# Patient Record
Sex: Male | Born: 1945 | Race: White | Hispanic: No | Smoking: Never smoker
Health system: Southern US, Community
[De-identification: ages and names within clinical notes are randomized; demographics above are authoritative.]

## PROBLEM LIST (undated history)

## (undated) DIAGNOSIS — F419 Anxiety disorder, unspecified: Secondary | ICD-10-CM

## (undated) DIAGNOSIS — E785 Hyperlipidemia, unspecified: Secondary | ICD-10-CM

## (undated) DIAGNOSIS — J189 Pneumonia, unspecified organism: Secondary | ICD-10-CM

## (undated) DIAGNOSIS — I499 Cardiac arrhythmia, unspecified: Secondary | ICD-10-CM

## (undated) DIAGNOSIS — I10 Essential (primary) hypertension: Secondary | ICD-10-CM

## (undated) DIAGNOSIS — C801 Malignant (primary) neoplasm, unspecified: Secondary | ICD-10-CM

## (undated) HISTORY — PX: CARDIOVERSION: SHX1299

## (undated) HISTORY — DX: Essential (primary) hypertension: I10

## (undated) HISTORY — PX: EYE SURGERY: SHX253

## (undated) HISTORY — DX: Hyperlipidemia, unspecified: E78.5

## (undated) SURGERY — Surgical Case
Anesthesia: *Unknown

---
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
## 2002-10-02 ENCOUNTER — Encounter: Payer: Self-pay | Admitting: Emergency Medicine

## 2002-10-03 ENCOUNTER — Inpatient Hospital Stay (HOSPITAL_COMMUNITY): Admission: EM | Admit: 2002-10-03 | Discharge: 2002-10-05 | Payer: Self-pay | Admitting: Emergency Medicine

## 2002-10-05 ENCOUNTER — Encounter: Payer: Self-pay | Admitting: Cardiology

## 2002-10-06 ENCOUNTER — Emergency Department (HOSPITAL_COMMUNITY): Admission: EM | Admit: 2002-10-06 | Discharge: 2002-10-06 | Payer: Self-pay | Admitting: Emergency Medicine

## 2002-10-06 ENCOUNTER — Encounter: Payer: Self-pay | Admitting: Emergency Medicine

## 2002-12-16 ENCOUNTER — Ambulatory Visit (HOSPITAL_COMMUNITY): Admission: RE | Admit: 2002-12-16 | Discharge: 2002-12-16 | Payer: Self-pay | Admitting: Cardiology

## 2004-09-15 ENCOUNTER — Ambulatory Visit: Payer: Self-pay | Admitting: Cardiology

## 2004-10-13 ENCOUNTER — Ambulatory Visit: Payer: Self-pay | Admitting: Cardiology

## 2004-12-18 ENCOUNTER — Ambulatory Visit: Payer: Self-pay | Admitting: Cardiology

## 2005-01-08 ENCOUNTER — Ambulatory Visit: Payer: Self-pay | Admitting: Cardiovascular Disease

## 2005-02-12 ENCOUNTER — Ambulatory Visit: Payer: Self-pay | Admitting: Cardiology

## 2005-05-03 ENCOUNTER — Ambulatory Visit: Payer: Self-pay | Admitting: Cardiology

## 2005-05-31 ENCOUNTER — Ambulatory Visit: Payer: Self-pay | Admitting: Internal Medicine

## 2005-07-12 ENCOUNTER — Ambulatory Visit: Payer: Self-pay | Admitting: Internal Medicine

## 2005-08-09 ENCOUNTER — Ambulatory Visit: Payer: Self-pay | Admitting: Cardiology

## 2005-09-06 ENCOUNTER — Ambulatory Visit: Payer: Self-pay | Admitting: Cardiology

## 2005-10-10 ENCOUNTER — Ambulatory Visit: Payer: Self-pay | Admitting: Cardiology

## 2005-11-07 ENCOUNTER — Ambulatory Visit: Payer: Self-pay | Admitting: Cardiovascular Disease

## 2005-11-28 ENCOUNTER — Ambulatory Visit: Payer: Self-pay | Admitting: Cardiology

## 2005-12-24 ENCOUNTER — Ambulatory Visit: Payer: Self-pay | Admitting: Cardiology

## 2006-01-29 ENCOUNTER — Ambulatory Visit: Payer: Self-pay | Admitting: Cardiology

## 2006-03-04 ENCOUNTER — Ambulatory Visit: Payer: Self-pay | Admitting: Cardiology

## 2006-04-02 ENCOUNTER — Ambulatory Visit: Payer: Self-pay | Admitting: Cardiology

## 2006-06-10 ENCOUNTER — Ambulatory Visit: Payer: Self-pay | Admitting: Internal Medicine

## 2006-07-09 ENCOUNTER — Ambulatory Visit: Payer: Self-pay | Admitting: Cardiovascular Disease

## 2006-07-23 ENCOUNTER — Ambulatory Visit: Payer: Self-pay | Admitting: Cardiology

## 2006-08-09 ENCOUNTER — Ambulatory Visit: Payer: Self-pay | Admitting: Cardiology

## 2006-09-11 ENCOUNTER — Ambulatory Visit: Payer: Self-pay | Admitting: Cardiology

## 2006-11-29 ENCOUNTER — Ambulatory Visit: Payer: Self-pay | Admitting: Internal Medicine

## 2006-12-20 ENCOUNTER — Ambulatory Visit: Payer: Self-pay | Admitting: Cardiology

## 2007-07-24 ENCOUNTER — Ambulatory Visit: Payer: Self-pay | Admitting: Cardiology

## 2010-06-12 ENCOUNTER — Ambulatory Visit: Payer: Self-pay | Admitting: Cardiovascular Disease

## 2010-07-21 ENCOUNTER — Ambulatory Visit: Payer: Self-pay | Admitting: Cardiology

## 2010-08-29 ENCOUNTER — Ambulatory Visit: Payer: Self-pay | Admitting: Cardiology

## 2010-10-02 ENCOUNTER — Ambulatory Visit: Payer: Self-pay | Admitting: Cardiology

## 2010-11-26 ENCOUNTER — Encounter: Payer: Self-pay | Admitting: Family Medicine

## 2011-02-19 ENCOUNTER — Other Ambulatory Visit: Payer: Self-pay | Admitting: *Deleted

## 2011-02-19 DIAGNOSIS — I4891 Unspecified atrial fibrillation: Secondary | ICD-10-CM

## 2011-02-19 MED ORDER — METOPROLOL TARTRATE 50 MG PO TABS: 50.0000 mg | ORAL_TABLET | Freq: Every day | ORAL | Status: DC

## 2011-02-19 MED ORDER — DILTIAZEM HCL ER COATED BEADS 300 MG PO CP24: 300.0000 mg | ORAL_CAPSULE | Freq: Every day | ORAL | Status: DC

## 2011-02-19 NOTE — Telephone Encounter (Signed)
escribe medication per fax request  

## 2011-03-23 NOTE — Consult Note (Signed)
NAMENICOLO, TOMKO NO.:  [id number]   MEDICAL RECORD NO.:  [id number]                   PATIENT TYPE:  INP   LOCATION:  0102                                 FACILITY:  [facility]   PHYSICIAN:  Rollene Rotunda, M.D. [facility]            DATE OF BIRTH:  11-Oct-1946   DATE OF CONSULTATION:  DATE OF DISCHARGE:                                   CONSULTATION   REASON FOR PRESENTATION:  The patient with atrial fibrillation.   HISTORY OF PRESENT ILLNESS:  The patient is a very pleasant 65 year old  gentleman with no prior cardiac history. He says that over the past four  weeks, he has had sporadic left lower quadrant and suprapubic pain. This has  been fleeting. He describes it as a dull heaviness. He has a feeling like he  needs to have a bowel movement. This afternoon, he had a particularly severe  episode of this. It was unrelenting. He tried to drink Mag Citrate. He  vomited with this. He denies constipation though he has not been quite as  regular. He has had no diarrhea or bleeding in his bowel movements. He  denies any change in his dietary habits, weight, abdominal distention. He  has been trying to lose weight intentionally. Of note, when the patient came  to the emergency room he was noted to be in atrial fibrillation with rapid  ventricular response. He was given IV Cardizem and started on by mouth. He  was started on IV Heparin. The patient does not notice palpitations. He has  had no presyncope or syncope. He denies any other cardiac symptoms. He says  that he is active at work. He denies any chest discomfort,  neck discomfort,  arm discomfort, activity induced nausea and vomiting, excessive diaphoresis.  He has had no PND or orthopnea.   PAST MEDICAL HISTORY:  Hypertension times there years, panic attacks. He has  no history of diabetes mellitus or hyperlipidemia as far as he knows. 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   PAST SURGICAL HISTORY:  Appendectomy.   ALLERGIES:  No known drug allergies.   MEDICATIONS:  1. Norvasc 10 mg each day.  2. Xanax 0.5 mg each day.   SOCIAL HISTORY:  The patient has never smoked cigarettes. He does drink  alcohol socially. He has not had any recent changes in alcohol use although  he  has had a little more than usual 3-4 days ago. He has been married for 40  years. He has two children, two natural and one adopted. He has there  grandchildren. 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   FAMILY HISTORY:  Contributory for early coronary artery disease with his  father having myocardial infarction in his 56's and dying in his 50's of  myocardial infarction. Has a brother having myocardial infarction  in his  66's and dying at age 44 years.   REVIEW OF SYSTEMS:  As stated in the HPI. Positive for headache, chronic  left shoulder pain. Otherwise as stated in the HPI. In particular, negative  for hair or skin changes, voice changes, heat or cold intolerance.   PHYSICAL EXAMINATION:  GENERAL: In no distress.  VITAL SIGNS: Initial blood pressure 174/99. Heart rate 120 and irregularly  irregular.  HEENT: Unremarkable. Pupils are equal, round, and reactive to light and  accommodation. Fundi not visualized. Oral mucosa unremarkable.  NECK: No jugular venous distention. Carotid upstroke brisk and symmetric. No  bruits or thyromegaly.  LYMPH: No cervical, axillary, or inguinal adenopathy.  LUNGS: Clear to auscultation and percussion.  BACK: No costovertebral angle tenderness.  CHEST: Unremarkable.  HEART: PMI not displaced or sustained. S1 and S2 within normal limits. No  S3, S4. No murmurs.  ABDOMEN: Obese. Positive bowel sounds. Normal in frequency and pitch. No  bruits, rebound, guarding, midline pulsatile mass, hepatosplenomegaly.  SKIN: Multiple excoriated lesions, particularly lower extremities and arms.  NEURO: Oriented to  person, place, and time. Cranial nerves 2-12 are intact.  Motor grossly intact.   LABORATORY DATA:  WBC count 12.3, hemoglobin 15.5, platelets 225, sodium  142, potassium 3.9, chloride 106, BUN 14, creatinine 1.2, CK 191, MB 4.1,  troponin 0.02. INR 0.9.   DIAGNOSTIC IMPRESSION:  Chest x-ray with mild pulmonary vascular congestion  and mild cardiomegaly. Portable film.   ASSESSMENT/PLAN:  1. Atrial fibrillation. The patient has atrial fibrillation that is     asymptomatic. It is of unknown duration. At this point, I agree with the     plan as outlined by Dr. Lenord Fellers. He should have rate control with oral     Cardizem. This can be titrated as an outpatient for adequate rate control     and I would judge control by Holter monitoring. He will also need chronic     anti-coagulation. He will be started on Heparin. The Coumadin can be     titrated as an outpatient provided there is no evidence of GI pathology     or further contraindications, given his abdominal complaints. If he does     not convert spontaneously to normal sinus rhythm, I would plan DC     cardioversion, after he has had  an adequate course of anti-coagulation.     I agree with checking his TSH and an echocardiogram for chamber size and     LV function.  2. Hypertension. His blood pressure is slightly elevated. This can be     treated in the context of rate control. He will not be on Norvasc any     longer, as he will be on Cardizem.  3. Risk factors. The patient does have significant cardiac risk factors. I     agree with ruling out myocardial infarction with enzymes. Will most     likely consider an outpatient Cardiolite when I see him in follow-up. He     can have aggressive primary risk reduction.  4. Risk reduction. I would check a lipid level. This has probably been done     as an outpatient and I will try to obtain     these results. 5. Obesity. The patient has been dieting although he has been skipping meals      to do this. Perhaps a better regimen would be a weight watchers program.     I applaud his interest in losing weight.  Rollene Rotunda, M.D. [facility]    JH/MEDQ  D:  10/03/2002  T:  10/03/2002  Job:  [id number]   cc:   Talmadge Coventry, M.D.  [address], [address]  [place]  [place] [address]  Fax: [phone]   Luanna Cole. Lenord Fellers, M.D.  [address] [place]  [place]  [place] [address]  Fax: [phone]

## 2011-03-23 NOTE — Discharge Summary (Signed)
NAMESHEA, [name]                         ACCOUNT NO.:  [id number]   MEDICAL RECORD NO.:  [id number]                   PATIENT TYPE:  INP   LOCATION:  0353                                 FACILITY:  [facility]   PHYSICIAN:  [name], M.D.         DATE OF BIRTH:  1946/03/20   DATE OF ADMISSION:  DATE OF DISCHARGE:  10/05/2002                                 DISCHARGE SUMMARY   CHIEF COMPLAINT:  Left lower quadrant abdominal pain.   DISCHARGE DIAGNOSES:  1. Abdominal discomfort, unclear etiology, question viral gastroenteritis     with complaints of left lower quadrant abdominal pain, vomiting x1,     resolved in the emergency room with no recurrence.  2. New onset atrial fibrillation with rapid ventricular response post     cardiology consultation with Dr. Corinda Christensen.  Cardiology with Dr. Antoine Christensen.     a. Previous history of hypertension maintained on Norvasc.     b. Status post echocardiogram done 10/05/02, results pending at time of        dictation.     c. Heart rate controlled on new medication, Cardizem.     d. Anticoagulation heparin to Coumadin and discharged with Coumadin,        still subtherapeutic to be followed by Dr. Corinda Christensen Coumadin clinic        post discharge.  3. Hypertension.  Blood pressure controlled with current medications.  4. Transient hyperglycemia with no indication of diabetes.  Hemoglobin A1c     5.5.  5. Anxiety disorder, not stable on current medication of Xanax.   DISCHARGE MEDICATIONS:  1. Cardizem CD 180 mg daily.  2. Protonix 40 mg daily.  3. Lopressor 50 mg daily.  4. Xanax 0.5 mg twice daily.  5. Claritin 10 mg daily.  6. Triamcinolone 0.1% cream to the lower extremity lesions three times     daily.  7. Coumadin 5 mg daily at 4 p.m.  To follow with Coumadin Clinic, Dr.     Corinda Christensen of cardiology post discharge.   DISCHARGE INSTRUCTIONS:  1. Discharge diet low sodium, low fat.  2. Follow up with Dr. Corinda Christensen cardiology, Dr.  Antoine Christensen post discharge.  I     believe discharge plan will include an outpatient Cardiolite study.  3. Follow up with Dr. Corinda Christensen [facility] post discharge.  4. Follow up with Dr. Smith Christensen in one to two weeks post discharge.   CONSULTATIONS DURING HOSPITALIZATION:  1. Dr. Antoine Christensen, Dr. Corinda Christensen of cardiology.   PROCEDURE PERFORMED:  1. 2D echocardiogram done 10/05/02, results pending at time of discharge     dictation.   DISCHARGE LABORATORIES:  A CBC done 10/04/02 found WBC 6.9, hemoglobin 12.8,  hematocrit 36.9, platelets 170,000.  Metabolic panel:  Sodium 137, potassium  4.1, chloride 106, CO2 26, BUN 15, creatinine 1.3.  Glucose was 93.  Hemoglobin A1c was 5.5.  Cardiac enzymes x3 sets essentially unremarkable.  There was some mild  elevation in admission level MB at 4.1.  Serum TSH was  normal at 1.616.  BMP was 90.9.  CEA less than 0.5 and PSA 0.4.  A chest x-  ray on admission indicated pulmonary vascular congestion.  Mild right  basilar atelectasis/scarring.   For past medical history, social history, family history, review of systems,  admission exam, admission impressions, please see dictated admission history  and physical dated 10/02/02.   HOSPITAL COURSE:  Problem 1.  Abdominal discomfort/pain.  This 65 year old  male patient of Dr. Smith Christensen presented to [facility] Emergency Room with  the onset of left lower quadrant pain.  This lasted approximately two hours  and was described as somewhat severe.  He vomited once.  With arrival to the  emergency room, the pain apparently self resolved and has not recurred over  the course of the hospitalization.  This is suspected to have been a viral  gastroenteritis.   Problem 2.  New onset atrial fibrillation.  With arrival to the emergency  room, the patient was noted to be in atrial fibrillation with rapid  ventricular response.  Heart rate was 123.  The patient was initiated on IV  Cardizem, dropping his rate to the 90s.   Three sets of cardiac enzymes were  obtained and these were essentially negative except for a very mild  elevation in the initial CK MB at 4.1.  BMP was normal at 90.9.  Cardiology  consultation was obtained with Dr. Antoine Christensen, [facility] cardiology.  The  patient was started on heparin with the plan to convert to Coumadin.  It was  noted the patient had converted spontaneously to normal sinus rhythm with  Cardizem.  Recommended a check of the TSH and echocardiogram as above.  The  plan is to follow up post discharge with likely Cardiolite study at [facility]  Cardiology office.  The patient was switched to oral Cardizem and his heart  rate remains controlled.  He was initiated on 5 mg of Coumadin with pharmacy  following both heparin and Coumadin dosing.  At discharge, INR is 1.0.  We  will discharge on 5 mg of Coumadin and have the patient followed at [facility]  Coumadin [facility].   Problem 3.  Hypertension.  The patient has a history of hypertension on  Norvasc only previously.  Blood pressure appears relatively stable on his  current medications as per discharge medications above.  This should be  followed as an outpatient by Dr. Smith Christensen during the next office follow up.   Problem 4.  Transient hyperglycemia.  Noted some mild elevation in blood  glucose per admission laboratories of 134 and 129, respectively.  A  hemoglobin A1c was checked and this is 5.5.  There is no indication of  diabetes here.   Problem 5.  Anxiety disorder.  The patient was previously maintained on  Xanax.  This was continued over the course of hospitalization and anxiety  disorder remained stable.    CONDITION ON DISCHARGE:  Stable/improved.  Discharge process greater than 30  minutes, 9:15 a.m. through 10 o'clock a.m.   DISPOSITION:  The patient returning home where he lives independently.        [name], N.P.                     [name], M.D.    TC/MEDQ  D:  10/05/2002  T:   10/05/2002  Job:  [id number]   cc:   [name], M.D.  [address], [address]  [place]  [place] [address]  Fax: [phone]   Rollene Rotunda, M.D. [facility]  [address]  [place]  [place] [address]  Fax: [phone]

## 2011-03-23 NOTE — Assessment & Plan Note (Signed)
HEALTHCARE                              CARDIOLOGY OFFICE NOTE   NAME:[name], [name]                      MRN:          [id number]  DATE:07/23/2006                            DOB:          05-09-1946    PRIMARY CARE PHYSICIAN:  Talmadge Coventry, M.D.   REASON FOR PRESENTATION:  Evaluate patient with atrial fibrillation.   HISTORY OF PRESENT ILLNESS:  Patient is a pleasant 65 year old gentleman who  returns for follow-up.  He has had no new symptoms since I last saw him.  He  is in permanent atrial fibrillation but does not notice this.  He has  decided against going to [facility] to talk about atrial fibrillation ablation.  He tolerates Coumadin and has had no problems with this.  He has had no  palpitations, no presyncope or syncope and denies any chest discomfort or  shortness of breath.  He remains active.   PAST MEDICAL HISTORY:  1. Hypertension.  2. Panic attacks.  3. Permanent atrial fibrillation.  4. Nephrolithiasis.  5. 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  6. Transient hyperglycemia with a normal hemoglobin A1c.   ALLERGIES:  NO KNOWN DRUG ALLERGIES.   MEDICATIONS:  1. Coumadin per our Coumadin Clinic.  2. Protonix 40 mg daily.  3. Metoprolol 50 mg daily.  4. Cardizem 300 mg daily.  5. Lotrel 40 mg daily.  6. Fexofenadine.  7. Xanax 0.5 mg daily.   REVIEW OF SYSTEMS:  As stated in the HPI, otherwise negative for other  systems.   PHYSICAL EXAMINATION:  GENERAL APPEARANCE:  Patient is in no distress.  VITAL SIGNS:  Blood pressure 136/74, heart rate 66 and regular, weight 302  pounds, body mass index 39.  HEENT:  Eye lids unremarkable.  Pupils are equal, round and reactive to  light.  Fundi not visualized.  Oral mucosa unremarkable.  NECK:  No jugular venous distension.  Wave form within normal limits.  Carotid upstroke brisk and symmetric.  No bruits, no thyromegaly.  LYMPHATICS:  No cervical, axillary or  inguinal adenopathy.  CHEST:  Unremarkable.  LUNGS:  Clear to auscultation bilaterally.  BACK:  No costovertebral angle tenderness.  CARDIOVASCULAR:  PMI not displaced or sustained.  S1 and S2 within normal  limits.  No S3, no murmurs.  ABDOMEN:  Obese, positive bowel sounds, normal in frequency and pitch, no  bruits, no rebound, no guarding, no midline pulsatile mass.  No  hepatomegaly.  No splenomegaly.  SKIN:  No rashes, no nodules.  EXTREMITIES:  There are 2+ pulses throughout.  Trace bilateral lower  extremity edema.  No cyanosis, no clubbing.  NEUROLOGIC:  Oriented to person, place and time.  Cranial nerves II-XII  grossly intact.  Motor grossly intact.   EKG:  Atrial fibrillation, rate 66, axis within normal limits, intervals  within normal limits, poor anterior R-wave progression, no acute STT wave  changes.   ASSESSMENT/PLAN:  1. The patient has atrial fibrillation.  He is asymptomatic.  He has good      rate control.  He is tolerating Coumadin.  At  this time, no further      cardiovascular testing is suggested.  He will continue with the current      plan.  2. Obesity:  We discussed weight loss  and I suggested a [place]      approach.  He will look into this.  3. Follow-up:  I will see him back in 12 months or sooner if needed.                               Rollene Rotunda, MD, [facility]    JH/MedQ  DD:  07/23/2006  DT:  07/24/2006  Job #:  [id number]   cc:   Talmadge Coventry, M.D.

## 2011-03-23 NOTE — H&P (Signed)
NAME:  [name], [name] NO.:  [id number]   MEDICAL RECORD NO.:  [id number]                   PATIENT TYPE:  EMS   LOCATION:  ED                                   FACILITY:  [facility]   PHYSICIAN:  Luanna Cole. Lenord Fellers, M.D.                DATE OF BIRTH:  [birth year]   DATE OF ADMISSION:  10/02/2002  DATE OF DISCHARGE:                                HISTORY & PHYSICAL   CHIEF COMPLAINT:  Left lower quadrant abdominal pain.   HISTORY OF PRESENT ILLNESS:  This 65 year old white male patient of Dr.  Talmadge Coventry, present to [facility] emergency room  this evening with onset of left lower quadrant pain this afternoon for  approximately two hours that was somewhat severe. He vomited once. While in  the emergency room the emergency room physician noted atrial fibrillation  with rapid ventricular response with a rate of approximately 123 beats per  minute. The patient was given IV Cardizem 20 mg and is rate decreased to the  90's. He has a history of hypertension and is treated with Norvasc 10 mg  daily. He has a long standing history of anxiety and is on Xanax 0.5 mg  twice a day or each day. He usually just takes Xanax  each day. He has not  seen his primary care physician in some 2-3 years but is scheduled for a  physical examination in January of 2004. [place] No history of coronary artery disease or  diabetes mellitus. His abdominal pain spontaneously resolved while he was in  the emergency room being evaluated. He has been having some obstipation for  a few weeks. He has never had a colonoscopy for colon cancer screening.   CURRENT MEDICATIONS:  1. Norvasc 10 mg daily.  2. Xanax 0.5 mg each day or twice a day but usually just takes it once daily   ALLERGIES:  No known drug allergies.   PAST MEDICAL HISTORY:  He was hospitalized in approximately 1982 by Dr.  Rosalie Doctor to rule  out  a brain tumor. The patient says that he was told  he had a scar from meningitis and not a brain tumor.   SOCIAL HISTORY:  He is married. His wife works for [employer]. He is  self-employed in the [employer] along with his two sons. He is a non-  smoker and has never smoked. Social alcohol consumption, generally two  glasses of wine daily. He says that he has been under a fair amount of  stress due to one son going through a divorce.   FAMILY HISTORY:  Father died of a myocardial infarction in this 58's. Mother  died of a myocardial infarction in her 64's. One brother died of a  myocardial infarction in his early 36's. One sister with history of  hypertension and another with history of diabetes mellitus.   REVIEW OF SYSTEMS:  The patient says that his bowel movements are regular on  a daily basis and his last bowel movement was this morning. He has chronic  pruritus of his lower extremities, especially worse with insect bites. He  usually takes Zyrtec for lower extremity itching but has run out of this  medication and quit taking it. History of frequent temporal headaches over  the past few weeks.   PHYSICAL EXAMINATION:  VITAL SIGNS: Per emergency room sheet. He was  afebrile. Currently his rate is 110 and is irregular. Pulse ox is 97%.  GENERAL: An obese white male, resting comfortably in the emergency room in  no acute distress.  SKIN:  Warm and dry. He has multiple excoriations on his legs and multiple  superficial varicosities. No nodes.  HEENT:  Normocephalic, atraumatic. Pharynx and TM's clear.  NECK: Supple. No bruits.  CHEST: Clear.  CARDIAC: Irregular, irregular rhythm. Rate of 110. Normal S1 and S2 without  S3 or S4. No murmurs. No rubs.  ABDOMEN: Soft, obese. Bowel sounds increased. No organomegaly or masses. No  tenderness at this time.  EXTREMITIES: See skin examination. No lower extremity pitting edema.  NEURO: No focal deficits on brief neuro  examination.   IMPRESSION:  1. Left lower quadrant pain. The original reason why the patient presented     to the emergency room has spontaneously resolved. It is unclear why this     happened. Could be possibly ischemic bowel or perhaps,     constipation/obstipation but patient described the pain as being fairly     severe for two hours with spontaneous resolution. He is currently not     tender and does not have a fever or significantly elevated white count to     suggest acute diverticulitis but he has never had colon cancer screening.  2. Hypertension. Treated as an outpatient on Norvasc.  3. Obesity. No history of diabetes mellitus but this will be ruled out.  4. Strong family history of coronary artery disease. Risk factor for patient     having heart disease.  5. History of anxiety and claustrophobia, treated by Dr. Jodi Marble with chronic     Xanax for years.  6. 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   PLAN:  The patient will be admitted to the telemetry unit. He will be  started on IV Heparin per pharmacy protocol. He is going to be started on by  mouth Heparin and by mouth Lopressor. Cardiology consultation will be  obtained. A 2-D echocardiogram will be obtained to look at the left atrial  size. When the patient is stable, consider going a CT of the abdomen and  pelvis, to rule out left lower quadrant pathology with history of acute left  lower quadrant pain. Also consider a CT or MRI of the brain because of  recent frequent temporal headaches. He needs a screening colonoscopy as an  outpatient. Stool guaiacs have been ordered here in the hospital along with  a PSA and a CEA. The patient may well need an outpatient Cardiolite study  when he is stable and after myocardial infarction has been ruled out. CK MB  fractions and troponin I are pending. Check free T4 and TSH with new onset  atrial fibrillation.  Luanna Cole.  Lenord Fellers, M.D.    MJB/MEDQ  D:  10/02/2002  T:  10/03/2002  Job:  [id number]   cc:   Talmadge Coventry, M.D.  [address], [address]  [place]  [place] [address]  Fax: [phone]

## 2011-04-05 ENCOUNTER — Other Ambulatory Visit: Payer: Self-pay | Admitting: *Deleted

## 2011-04-05 DIAGNOSIS — I4891 Unspecified atrial fibrillation: Secondary | ICD-10-CM

## 2011-04-05 NOTE — Telephone Encounter (Signed)
Faxed refill for diltiazem. Denied. Pt had his records transferred to Dr. Yates Decamp

## 2013-05-28 ENCOUNTER — Ambulatory Visit (INDEPENDENT_AMBULATORY_CARE_PROVIDER_SITE_OTHER): Payer: Medicare Other | Admitting: Surgery

## 2013-06-04 ENCOUNTER — Ambulatory Visit (INDEPENDENT_AMBULATORY_CARE_PROVIDER_SITE_OTHER): Payer: Medicare Other | Admitting: Surgery

## 2013-06-04 ENCOUNTER — Other Ambulatory Visit (INDEPENDENT_AMBULATORY_CARE_PROVIDER_SITE_OTHER): Payer: Self-pay | Admitting: Surgery

## 2013-06-04 ENCOUNTER — Encounter (INDEPENDENT_AMBULATORY_CARE_PROVIDER_SITE_OTHER): Payer: Self-pay | Admitting: Surgery

## 2013-06-04 VITALS — BP 130/80 | HR 64 | Temp 97.6°F | Resp 14 | Ht 74.0 in | Wt 277.4 lb

## 2013-06-04 DIAGNOSIS — K429 Umbilical hernia without obstruction or gangrene: Secondary | ICD-10-CM | POA: Insufficient documentation

## 2013-06-04 NOTE — Progress Notes (Signed)
 Re:   [name] DOB:   02/02/1946 MRN:   [id number]  ASSESSMENT AND PLAN: 1.  Ventral hernia  This hernia is immediately above the umbilicus - but I think is more a ventral hernia  I discussed the indications and complications of hernia surgery with the patient.  I discussed both the laparoscopic and open approach to hernia repair..  The potential risks of hernia surgery include, but are not limited to, bleeding, infection, open surgery, nerve injury, and recurrence of the hernia.  I provided the patient literature about hernia surgery.  I think that he is a candidate for laparoscopic repair.  Will schedule at the timing of the patient.  2.  Hypertension 3.  Hyperlipidemia 4.  A. Fib   Followed by [name] 5.  On coumadin  Will stop ahead  [name] has provided an outline to follow with transition to Lovenox 6.  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 7.  History of kidney stones 8.  Claustophic 9.  Diastasis recti  Chief Complaint  Patient presents with  . New Evaluation    eval [facility]MB hernia   REFERRING PHYSICIAN: KIM, JAMES, MD  HISTORY OF PRESENT ILLNESS: [name] is a 67 y.o. (DOB: 04/04/1946)  white  male whose primary care physician is KIM, JAMES, MD and comes to me today for ventral hernia.  The patient has noticed an increasing hernia in his abdominal wall for several years.  It was small, but has become larger.  And his weight has fluctuated.  Last fall his wife was found to have a brain tumor.  He had been married to her for 50 years.  She was over at [facility] for almost 5 months before she died.  She died about 2 months ago.  He admits that he did not take good care of himself while he was taking care of her. He's now ready to address taking care of the ventral hernia.  He had an appendectomy around 1950.  This is his only abdominal surgery.  He has never had a colonoscopy, though Dr. Kim has discussed this with him for  years.  He has not stomach, liver, pancreas, or colon disease.    Past Medical History  Diagnosis Date  . Hyperlipidemia   . Hypertension       Past Surgical History  Procedure Laterality Date  . Appendectomy  1950     Current Outpatient Prescriptions  Medication Sig Dispense Refill  . ALPRAZolam (XANAX) 0.5 MG tablet       . atorvastatin (LIPITOR) 80 MG tablet       . lisinopril (PRINIVIL,ZESTRIL) 10 MG tablet       . nystatin (MYCOSTATIN) 100000 UNIT/ML suspension       . warfarin (COUMADIN) 5 MG tablet       . diltiazem (CARDIZEM CD) 300 MG 24 hr capsule Take 1 capsule (300 mg total) by mouth daily. Needs office visit before additional refills  30 capsule  0  . metoprolol (LOPRESSOR) 50 MG tablet Take 1 tablet (50 mg total) by mouth daily. Needs office visit before additional refills done  30 tablet  0   No current facility-administered medications for this visit.     Not on File  REVIEW OF SYSTEMS: Skin:  No history of rash.  No history of abnormal moles. Infection:  Has recurrent oral thrush.  Cause unknown. Neurologic:  No history of stroke.  No history of   seizure.  No history of headaches. Cardiac:  Atrial fibrillation x 15 years.  Followed by Dr. J. Christensen.  There is a thorough note by [name] explaining coming off of coumadin and the patient's decline for a stress test. Pulmonary:  Does not smoke cigarettes.  No asthma or bronchitis.  No OSA/CPAP.  Endocrine:  No diabetes. No thyroid disease. Gastrointestinal:  No history of stomach disease.  No history of liver disease.  No history of gall bladder disease.  No history of pancreas disease.  No history of colon disease. Urologic:  One episode of kidney stones. Musculoskeletal:  No history of joint or back disease. Hematologic:  On coumadin. Psycho-social:  The patient is oriented.   The patient has no obvious psychologic or social impairment to understanding our conversation and plan.  SOCIAL and FAMILY  HISTORY: Widowed.   Recently lost wife to brain tumor. Going to [employer] in [place] for [employer] event.  PHYSICAL EXAM: BP 130/80  Pulse 64  Temp(Src) 97.6 F (36.4 C) (Temporal)  Resp 14  Ht 6' 2" (1.88 m)  Wt 277 lb 6.4 oz (125.828 kg)  BMI 35.6 kg/m2  General: Obese WN WM who is alert and generally healthy appearing.  HEENT: Normal. Pupils equal.  Thin white hair. Neck: Supple. No mass.  No thyroid mass. Lymph Nodes:  No supraclavicular or cervical nodes. Lungs: Clear to auscultation and symmetric breath sounds. Heart:  Irregular rhythm consistent with A Fib. No murmur or rub. Abdomen: Obese. Soft. No mass. No tenderness. Normal bowel sounds.  RLQ scar.  Diastasis recti.  approx 4 to 5 cm defect immediately above the umbilicus with apporx 10 cm hernia sac.  He does have a diastasis recti, that is separate from the hernia. Rectal: Not done. Extremities:  Good strength and ROM  in upper and lower extremities. Neurologic:  Grossly intact to motor and sensory function. Psychiatric: Has normal mood and affect. Behavior is normal.   DATA REVIEWED: Epic, notes from Drs. Kim and Christensen.  Frederick Kaneshiro, MD,  FACS [facility] [address]  [address]   [place], [place]    [address] Phone:  [phone] FAX:  [phone]  

## 2013-06-15 ENCOUNTER — Encounter (HOSPITAL_COMMUNITY): Payer: Self-pay | Admitting: Pharmacy Technician

## 2013-06-17 ENCOUNTER — Encounter (HOSPITAL_COMMUNITY): Payer: Self-pay

## 2013-06-17 ENCOUNTER — Telehealth (INDEPENDENT_AMBULATORY_CARE_PROVIDER_SITE_OTHER): Payer: Self-pay

## 2013-06-17 ENCOUNTER — Ambulatory Visit (HOSPITAL_COMMUNITY)
Admission: RE | Admit: 2013-06-17 | Discharge: 2013-06-17 | Disposition: A | Discharge status: Home / Self Care | Payer: Medicare Other | Source: Ambulatory Visit | Attending: Surgery | Admitting: Surgery

## 2013-06-17 ENCOUNTER — Encounter (HOSPITAL_COMMUNITY)
Admission: RE | Admit: 2013-06-17 | Discharge: 2013-06-17 | Disposition: A | Discharge status: Home / Self Care | Payer: Medicare Other | Source: Ambulatory Visit | Attending: Surgery | Admitting: Surgery

## 2013-06-17 DIAGNOSIS — K439 Ventral hernia without obstruction or gangrene: Secondary | ICD-10-CM | POA: Insufficient documentation

## 2013-06-17 DIAGNOSIS — Z01818 Encounter for other preprocedural examination: Secondary | ICD-10-CM | POA: Insufficient documentation

## 2013-06-17 DIAGNOSIS — Z01812 Encounter for preprocedural laboratory examination: Secondary | ICD-10-CM | POA: Insufficient documentation

## 2013-06-17 HISTORY — DX: Cardiac arrhythmia, unspecified: I49.9

## 2013-06-17 HISTORY — DX: Pneumonia, unspecified organism: J18.9

## 2013-06-17 HISTORY — DX: Anxiety disorder, unspecified: F41.9

## 2013-06-17 LAB — BASIC METABOLIC PANEL
Calcium: 8.8 mg/dL (ref 8.4–10.5)
GFR calc non Af Amer: 86 mL/min — ABNORMAL LOW (ref >90)
Glucose, Bld: 122 mg/dL — ABNORMAL HIGH (ref 70–99)
Potassium: 4.1 mEq/L (ref 3.5–5.1)
Sodium: 138 mEq/L (ref 135–145)

## 2013-06-17 LAB — CBC
Hemoglobin: 14.7 g/dL (ref 13.0–17.0)
MCH: 29.8 pg (ref 26.0–34.0)
MCHC: 33.6 g/dL (ref 30.0–36.0)
Platelets: 193 10*3/uL (ref 150–400)
RBC: 4.94 MIL/uL (ref 4.22–5.81)

## 2013-06-17 LAB — PROTIME-INR
INR: 1.96 — ABNORMAL HIGH (ref 0.00–1.49)
Prothrombin Time: 21.7 seconds — ABNORMAL HIGH (ref 11.6–15.2)

## 2013-06-17 LAB — APTT: aPTT: 38 seconds — ABNORMAL HIGH (ref 24–37)

## 2013-06-17 NOTE — Progress Notes (Signed)
EKG 05/05/13 on chart, LOV note 05/05/13 on chart

## 2013-06-17 NOTE — Progress Notes (Signed)
06/17/13 0920  OBSTRUCTIVE SLEEP APNEA  Have you ever been diagnosed with sleep apnea through a sleep study? No  Do you snore loudly (loud enough to be heard through closed doors)?  0  Do you often feel tired, fatigued, or sleepy during the daytime? 0  Has anyone observed you stop breathing during your sleep? 0  Do you have, or are you being treated for high blood pressure? 1  BMI more than 35 kg/m2? 1  Age over 69 years old? 1  Neck circumference greater than 40 cm/18 inches? 0  Gender: 1  Obstructive Sleep Apnea Score 4  Score 4 or greater  Results sent to PCP

## 2013-06-17 NOTE — Patient Instructions (Addendum)
20 CREEDON DANIELSKI  06/17/2013   Your procedure is scheduled on: 06/23/13  Report to [facility] at 1130 AM.  Call this number if you have problems the morning of surgery 336-: [phone]   Remember:   Do not eat food After Midnight, clear liquids from midnight until 8:00am on 06/23/13 then nothing.      Take these medicines the morning of surgery with A SIP OF WATER: xanax if needed, diltiazem, metoprolol   Do not wear jewelry, make-up or nail polish.  Do not wear lotions, powders, or perfumes. You may wear deodorant.  Do not shave 48 hours prior to surgery. Men may shave face and neck.  Do not bring valuables to the hospital.  Contacts, dentures or bridgework may not be worn into surgery.  Leave suitcase in the car. After surgery it may be brought to your room.  For patients admitted to the hospital, checkout time is 11:00 AM the day of discharge.    Please read over the following fact sheets that you were given: clear liquids fact sheet Birdie Sons, RN  pre op nurse call if needed [phone]    FAILURE TO FOLLOW THESE INSTRUCTIONS MAY RESULT IN CANCELLATION OF YOUR SURGERY   Patient Signature: ___________________________________________

## 2013-06-17 NOTE — Telephone Encounter (Signed)
Patient calling into office to report that his prescription for Lovenox only comes in 150 mg or 300 mg.  Patient states Dr. Ezzard Standing prescribed 180 mg and wasn't sure what to do at this point.  Paged and reviewed with Dr. Ezzard Standing.  Per verbal order from Dr. Linna Darner for patient to take 150 mg Lovenox.  Patient verbalized understanding.

## 2013-06-17 NOTE — Telephone Encounter (Signed)
Pharmacy called regarding prescription for Lovenox. Relayed message below okay to give 150mg  lovenox.

## 2013-06-23 ENCOUNTER — Encounter (HOSPITAL_COMMUNITY): Payer: Self-pay | Admitting: *Deleted

## 2013-06-23 ENCOUNTER — Ambulatory Visit (HOSPITAL_COMMUNITY): Payer: Medicare Other | Admitting: Certified Registered"

## 2013-06-23 ENCOUNTER — Encounter (HOSPITAL_COMMUNITY)
Admission: RE | Disposition: A | Discharge status: Home / Self Care | Payer: Self-pay | Source: Ambulatory Visit | Attending: Surgery

## 2013-06-23 ENCOUNTER — Encounter (HOSPITAL_COMMUNITY): Payer: Self-pay | Admitting: Certified Registered"

## 2013-06-23 ENCOUNTER — Observation Stay (HOSPITAL_COMMUNITY)
Admission: RE | Admit: 2013-06-23 | Discharge: 2013-06-24 | Disposition: A | Discharge status: Home / Self Care | Payer: Medicare Other | Source: Ambulatory Visit | Attending: Surgery | Admitting: Surgery

## 2013-06-23 DIAGNOSIS — K436 Other and unspecified ventral hernia with obstruction, without gangrene: Secondary | ICD-10-CM

## 2013-06-23 DIAGNOSIS — I10 Essential (primary) hypertension: Secondary | ICD-10-CM | POA: Insufficient documentation

## 2013-06-23 DIAGNOSIS — K429 Umbilical hernia without obstruction or gangrene: Secondary | ICD-10-CM | POA: Insufficient documentation

## 2013-06-23 DIAGNOSIS — K439 Ventral hernia without obstruction or gangrene: Principal | ICD-10-CM | POA: Insufficient documentation

## 2013-06-23 DIAGNOSIS — Z87442 Personal history of urinary calculi: Secondary | ICD-10-CM | POA: Insufficient documentation

## 2013-06-23 DIAGNOSIS — Z7901 Long term (current) use of anticoagulants: Secondary | ICD-10-CM | POA: Insufficient documentation

## 2013-06-23 DIAGNOSIS — M6200 Separation of muscle (nontraumatic), unspecified site: Secondary | ICD-10-CM | POA: Insufficient documentation

## 2013-06-23 DIAGNOSIS — E785 Hyperlipidemia, unspecified: Secondary | ICD-10-CM | POA: Insufficient documentation

## 2013-06-23 DIAGNOSIS — F40298 Other specified phobia: Secondary | ICD-10-CM | POA: Insufficient documentation

## 2013-06-23 DIAGNOSIS — Z79899 Other long term (current) drug therapy: Secondary | ICD-10-CM | POA: Insufficient documentation

## 2013-06-23 DIAGNOSIS — I4891 Unspecified atrial fibrillation: Secondary | ICD-10-CM | POA: Insufficient documentation

## 2013-06-23 HISTORY — PX: VENTRAL HERNIA REPAIR: SHX424

## 2013-06-23 SURGERY — REPAIR, HERNIA, VENTRAL, LAPAROSCOPIC
Anesthesia: General | Site: Abdomen | Wound class: Clean

## 2013-06-23 MED ORDER — LIDOCAINE HCL (PF) 2 % IJ SOLN
INTRAMUSCULAR | Status: DC | PRN
  Administered 2013-06-23: 40 mg

## 2013-06-23 MED ORDER — DEXAMETHASONE SODIUM PHOSPHATE 10 MG/ML IJ SOLN
INTRAMUSCULAR | Status: DC | PRN
  Administered 2013-06-23: 10 mg via INTRAVENOUS

## 2013-06-23 MED ORDER — CEFAZOLIN SODIUM 1-5 GM-% IV SOLN
INTRAVENOUS | Status: AC
  Filled 2013-06-23: qty 50

## 2013-06-23 MED ORDER — PROPOFOL 10 MG/ML IV BOLUS
INTRAVENOUS | Status: DC | PRN
  Administered 2013-06-23: 200 mg via INTRAVENOUS

## 2013-06-23 MED ORDER — HYDROMORPHONE HCL PF 1 MG/ML IJ SOLN: 0.2500 mg | INTRAMUSCULAR | Status: DC | PRN

## 2013-06-23 MED ORDER — BUPIVACAINE-EPINEPHRINE (PF) 0.5% -1:200000 IJ SOLN
INTRAMUSCULAR | Status: AC
  Filled 2013-06-23: qty 10

## 2013-06-23 MED ORDER — 0.9 % SODIUM CHLORIDE (POUR BTL) OPTIME
TOPICAL | Status: DC | PRN
  Administered 2013-06-23: 1000 mL

## 2013-06-23 MED ORDER — NYSTATIN 100000 UNIT/ML MT SUSP
200000.0000 [IU] | Freq: Every day | OROMUCOSAL | Status: DC | PRN
  Filled 2013-06-23: qty 5

## 2013-06-23 MED ORDER — ONDANSETRON HCL 4 MG/2ML IJ SOLN: 4.0000 mg | Freq: Four times a day (QID) | INTRAMUSCULAR | Status: DC | PRN

## 2013-06-23 MED ORDER — SUCCINYLCHOLINE CHLORIDE 20 MG/ML IJ SOLN
INTRAMUSCULAR | Status: DC | PRN
  Administered 2013-06-23: 100 mg via INTRAVENOUS

## 2013-06-23 MED ORDER — ENOXAPARIN SODIUM 40 MG/0.4ML ~~LOC~~ SOLN
40.0000 mg | SUBCUTANEOUS | Status: DC
  Administered 2013-06-24: 40 mg via SUBCUTANEOUS
  Filled 2013-06-23 (×2): qty 0.4

## 2013-06-23 MED ORDER — ALPRAZOLAM 0.5 MG PO TABS: 0.5000 mg | ORAL_TABLET | Freq: Three times a day (TID) | ORAL | Status: DC | PRN

## 2013-06-23 MED ORDER — ROCURONIUM BROMIDE 100 MG/10ML IV SOLN
INTRAVENOUS | Status: DC | PRN
  Administered 2013-06-23: 40 mg via INTRAVENOUS
  Administered 2013-06-23: 10 mg via INTRAVENOUS

## 2013-06-23 MED ORDER — NEOSTIGMINE METHYLSULFATE 1 MG/ML IJ SOLN
INTRAMUSCULAR | Status: DC | PRN
  Administered 2013-06-23: 4 mg via INTRAVENOUS

## 2013-06-23 MED ORDER — KCL IN DEXTROSE-NACL 20-5-0.45 MEQ/L-%-% IV SOLN
INTRAVENOUS | Status: DC
  Administered 2013-06-23 – 2013-06-24 (×2): via INTRAVENOUS
  Filled 2013-06-23 (×3): qty 1000

## 2013-06-23 MED ORDER — CEFAZOLIN SODIUM 10 G IJ SOLR
3.0000 g | INTRAMUSCULAR | Status: DC
  Filled 2013-06-23: qty 3000

## 2013-06-23 MED ORDER — GLYCOPYRROLATE 0.2 MG/ML IJ SOLN
INTRAMUSCULAR | Status: DC | PRN
  Administered 2013-06-23: 0.6 mg via INTRAVENOUS

## 2013-06-23 MED ORDER — FENTANYL CITRATE 0.05 MG/ML IJ SOLN
INTRAMUSCULAR | Status: DC | PRN
  Administered 2013-06-23: 50 ug via INTRAVENOUS
  Administered 2013-06-23: 100 ug via INTRAVENOUS
  Administered 2013-06-23 (×2): 50 ug via INTRAVENOUS

## 2013-06-23 MED ORDER — HYDROCODONE-ACETAMINOPHEN 5-325 MG PO TABS
1.0000 | ORAL_TABLET | ORAL | Status: DC | PRN
Start: 2013-06-23 — End: 2013-06-24
  Administered 2013-06-24: 1 via ORAL
  Filled 2013-06-23: qty 1

## 2013-06-23 MED ORDER — PROMETHAZINE HCL 25 MG/ML IJ SOLN: 6.2500 mg | INTRAMUSCULAR | Status: DC | PRN

## 2013-06-23 MED ORDER — LACTATED RINGERS IV SOLN
INTRAVENOUS | Status: DC
  Administered 2013-06-23: 1000 mL via INTRAVENOUS

## 2013-06-23 MED ORDER — DILTIAZEM HCL ER COATED BEADS 300 MG PO CP24
300.0000 mg | ORAL_CAPSULE | Freq: Every day | ORAL | Status: DC
Start: 2013-06-23 — End: 2013-06-24
  Administered 2013-06-23: 300 mg via ORAL
  Filled 2013-06-23 (×2): qty 1

## 2013-06-23 MED ORDER — BUPIVACAINE HCL (PF) 0.25 % IJ SOLN
INTRAMUSCULAR | Status: DC | PRN
  Administered 2013-06-23: 17 mL

## 2013-06-23 MED ORDER — METOPROLOL TARTRATE 50 MG PO TABS
50.0000 mg | ORAL_TABLET | Freq: Every morning | ORAL | Status: DC
  Filled 2013-06-23: qty 1

## 2013-06-23 MED ORDER — MORPHINE SULFATE 2 MG/ML IJ SOLN
1.0000 mg | INTRAMUSCULAR | Status: DC | PRN
  Administered 2013-06-23 (×2): 2 mg via INTRAVENOUS
  Filled 2013-06-23: qty 1

## 2013-06-23 MED ORDER — LISINOPRIL 10 MG PO TABS
10.0000 mg | ORAL_TABLET | Freq: Every morning | ORAL | Status: DC
  Filled 2013-06-23: qty 1

## 2013-06-23 MED ORDER — KCL IN DEXTROSE-NACL 20-5-0.45 MEQ/L-%-% IV SOLN
INTRAVENOUS | Status: AC
  Filled 2013-06-23: qty 1000

## 2013-06-23 MED ORDER — MORPHINE SULFATE 2 MG/ML IJ SOLN
INTRAMUSCULAR | Status: AC
  Filled 2013-06-23: qty 1

## 2013-06-23 MED ORDER — ONDANSETRON HCL 4 MG/2ML IJ SOLN
INTRAMUSCULAR | Status: DC | PRN
  Administered 2013-06-23: 4 mg via INTRAVENOUS

## 2013-06-23 MED ORDER — MIDAZOLAM HCL 5 MG/5ML IJ SOLN
INTRAMUSCULAR | Status: DC | PRN
  Administered 2013-06-23: 2 mg via INTRAVENOUS

## 2013-06-23 MED ORDER — ONDANSETRON HCL 4 MG PO TABS: 4.0000 mg | ORAL_TABLET | Freq: Four times a day (QID) | ORAL | Status: DC | PRN

## 2013-06-23 MED ORDER — KETOROLAC TROMETHAMINE 30 MG/ML IJ SOLN: 15.0000 mg | Freq: Once | INTRAMUSCULAR | Status: DC | PRN

## 2013-06-23 MED ORDER — DILTIAZEM HCL ER COATED BEADS 300 MG PO CP24: 300.0000 mg | ORAL_CAPSULE | Freq: Every morning | ORAL | Status: DC

## 2013-06-23 MED ORDER — CEFAZOLIN SODIUM-DEXTROSE 2-3 GM-% IV SOLR
INTRAVENOUS | Status: AC
  Filled 2013-06-23: qty 50

## 2013-06-23 MED ORDER — BUPIVACAINE HCL (PF) 0.25 % IJ SOLN
INTRAMUSCULAR | Status: AC
  Filled 2013-06-23: qty 30

## 2013-06-23 SURGICAL SUPPLY — 49 items
ADH SKN CLS APL DERMABOND .7 (GAUZE/BANDAGES/DRESSINGS) ×1
APL SKNCLS STERI-STRIP NONHPOA (GAUZE/BANDAGES/DRESSINGS) ×1
BENZOIN TINCTURE PRP APPL 2/3 (GAUZE/BANDAGES/DRESSINGS) ×2 IMPLANT
BINDER ABD UNIV 12 45-62 (WOUND CARE) ×1 IMPLANT
BINDER ABDOMINAL 46IN 62IN (WOUND CARE) ×2
CANISTER SUCTION 2500CC (MISCELLANEOUS) ×2 IMPLANT
CLOTH BEACON ORANGE TIMEOUT ST (SAFETY) ×2 IMPLANT
DECANTER SPIKE VIAL GLASS SM (MISCELLANEOUS) ×2 IMPLANT
DERMABOND ADVANCED (GAUZE/BANDAGES/DRESSINGS) ×1
DERMABOND ADVANCED .7 DNX12 (GAUZE/BANDAGES/DRESSINGS) IMPLANT
DEVICE SECURE STRAP 25 ABSORB (INSTRUMENTS) ×2 IMPLANT
DEVICE TROCAR PUNCTURE CLOSURE (ENDOMECHANICALS) ×2 IMPLANT
DISSECTOR BLUNT TIP ENDO 5MM (MISCELLANEOUS) IMPLANT
DRAIN CHANNEL RND F F (WOUND CARE) IMPLANT
DRAPE INCISE IOBAN 66X45 STRL (DRAPES) ×2 IMPLANT
DRAPE LAPAROSCOPIC ABDOMINAL (DRAPES) ×2 IMPLANT
ELECT REM PT RETURN 9FT ADLT (ELECTROSURGICAL) ×2
ELECTRODE REM PT RTRN 9FT ADLT (ELECTROSURGICAL) ×1 IMPLANT
EVACUATOR SILICONE 100CC (DRAIN) IMPLANT
GLOVE BIOGEL PI IND STRL 7.0 (GLOVE) ×1 IMPLANT
GLOVE BIOGEL PI INDICATOR 7.0 (GLOVE) ×1
GLOVE SURG SIGNA 7.5 PF LTX (GLOVE) ×2 IMPLANT
GOWN STRL NON-REIN LRG LVL3 (GOWN DISPOSABLE) ×2 IMPLANT
GOWN STRL REIN XL XLG (GOWN DISPOSABLE) ×4 IMPLANT
KIT BASIN OR (CUSTOM PROCEDURE TRAY) ×2 IMPLANT
MARKER SKIN DUAL TIP RULER LAB (MISCELLANEOUS) ×2 IMPLANT
MESH PARIETEX 20X15 (Mesh General) ×1 IMPLANT
NDL INSUFFLATION 14GA 150MM (NEEDLE) IMPLANT
NDL SPNL 22GX3.5 QUINCKE BK (NEEDLE) ×1 IMPLANT
NEEDLE INSUFFLATION 14GA 150MM (NEEDLE) IMPLANT
NEEDLE SPNL 22GX3.5 QUINCKE BK (NEEDLE) ×2 IMPLANT
NS IRRIG 1000ML POUR BTL (IV SOLUTION) ×2 IMPLANT
PENCIL BUTTON HOLSTER BLD 10FT (ELECTRODE) ×2 IMPLANT
SCALPEL HARMONIC ACE (MISCELLANEOUS) ×2 IMPLANT
SCISSORS LAP 5X35 DISP (ENDOMECHANICALS) ×2 IMPLANT
SET IRRIG TUBING LAPAROSCOPIC (IRRIGATION / IRRIGATOR) IMPLANT
SOLUTION ANTI FOG 6CC (MISCELLANEOUS) ×2 IMPLANT
STAPLER VISISTAT 35W (STAPLE) IMPLANT
STRIP CLOSURE SKIN 1/2X4 (GAUZE/BANDAGES/DRESSINGS) ×1 IMPLANT
STRIP CLOSURE SKIN 1/4X4 (GAUZE/BANDAGES/DRESSINGS) ×1 IMPLANT
SUT NOVA 0 T19/GS 22DT (SUTURE) ×4 IMPLANT
SUT VIC AB 5-0 PS2 18 (SUTURE) ×4 IMPLANT
TACKER 5MM HERNIA 3.5CML NAB (ENDOMECHANICALS) ×4 IMPLANT
TOWEL OR 17X26 10 PK STRL BLUE (TOWEL DISPOSABLE) ×2 IMPLANT
TRAY FOLEY CATH 14FRSI W/METER (CATHETERS) ×2 IMPLANT
TRAY LAP CHOLE (CUSTOM PROCEDURE TRAY) ×2 IMPLANT
TROCAR BLADELESS OPT 5 75 (ENDOMECHANICALS) ×6 IMPLANT
TROCAR XCEL NON-BLD 11X100MML (ENDOMECHANICALS) ×2 IMPLANT
TUBING INSUFFLATION 10FT LAP (TUBING) ×2 IMPLANT

## 2013-06-23 NOTE — Op Note (Signed)
OPERATIVE NOTE  06/23/2013  2:36 PM  PATIENT:  [name], 67 y.o., male, MRN: [id number]  PREOP DIAGNOSIS:  ventral hernia  POSTOP DIAGNOSIS:   Incarcerated ventral hernia, umbilical hernia (total defect 5 x 7 cm) [photos in chart]  PROCEDURE:   Procedure(s): LAPAROSCOPIC VENTRAL/umbilical HERNIA repair  SURGEON:   Ovidio Kin, M.D.  ASSISTANT:   None  ANESTHESIA:   general  Anesthesiologist: Eilene Ghazi, MD CRNA: Vista Lawman, CRNA  General  EBL:  minimal  ml  BLOOD ADMINISTERED: none  DRAINS: none   LOCAL MEDICATIONS USED:   18 cc 1/4%  SPECIMEN:   None  COUNTS CORRECT:  YES  INDICATIONS FOR PROCEDURE:  [name] is a 67 y.o. (DOB: 05/27/1946) white  male whose primary care physician is Pearson Grippe, MD and comes for repair of ventral hernia.  The patient has had no prior incision in the area of the hernia.   The indications and risks of the surgery were explained to the patient.  The risks include, but are not limited to, infection, bleeding, and nerve injury.  OPERATIVE NOTE:  The patient was taken to room 1 at [facility].  He underwent a general anesthesia.  He was given 3 grams of Ancef at the beginning of the operation.   A time out was held and the surgical checklist run.   The abdomen was prepped with Cholroprep and sterilely draped.  I covered the abdomen with a Ioban drape.   I accessed the LUQ with a 5 mm trocar.  An additional 10 mm trocar was placed in the left mid abdomen.   He had a 5 x 7 cm combined defect of a ventral hernia and umbilical hernia.  The ventral hernia was immediately cranial to the umbilical hernia.  There was some omentum incarcerated in the Ventral defect.   Then I placed a 15 x 20 cm Parietex mesh in the abdomen.  It had 8 holding sutures of 0 Novafil.  These were tied down to secure the mesh to the anterior abdominal wall.  I then used 40 Securestrap tacks to tack the mesh to the anterior abdominal wall.   The abdomen was  deflated.  The mesh was inspected and there were no defects around the edge.   The trocars were then removed.  There was no bleeding at the trocar sites.  The incisions were closed with 5-0 Monocryl and painted with Dermabond. The puncture sites were closed with Steristrips.   The sponge and needle count were correct.  The patient was transferred to the recovery room in good condition.  Type of repair - mesh  (choices - primary suture, mesh, or component)  Name of mesh - Parietex  Size of mesh - Length 20 cm, Width 15 cm  Mesh overlap - 5 cm  Placement of mesh - beneath fascia and into the peritoneal cavity  (choices - beneath fascia and into peritoneal cavity, beneath fascia but external to peritoneal cavity, between the muscle and fascia, above or external to fascia)  Ovidio Kin, MD, [facility] Pager: [phone] Office phone:  [phone]

## 2013-06-23 NOTE — H&P (View-Only) (Signed)
Re:   [name] DOB:   1946/03/02 MRN:   [id number]  ASSESSMENT AND PLAN: 1.  Ventral hernia  This hernia is immediately above the umbilicus - but I think is more a ventral hernia  I discussed the indications and complications of hernia surgery with the patient.  I discussed both the laparoscopic and open approach to hernia repair..  The potential risks of hernia surgery include, but are not limited to, bleeding, infection, open surgery, nerve injury, and recurrence of the hernia.  I provided the patient literature about hernia surgery.  I think that he is a candidate for laparoscopic repair.  Will schedule at the timing of the patient.  2.  Hypertension 3.  Hyperlipidemia 4.  A. Fib   Followed by Dr. Jacinto Halim 5.  On coumadin  Will stop ahead  Dr. Jacinto Halim has provided an outline to follow with transition to Lovenox 6.  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 7.  History of kidney stones 8.  Claustophic 9.  Diastasis recti  Chief Complaint  Patient presents with  . New Evaluation    eval UMB hernia   REFERRING PHYSICIAN: Pearson Grippe, MD  HISTORY OF PRESENT ILLNESS: [name] is a 67 y.o. (DOB: [birth year])  white  male whose primary care physician is Pearson Grippe, MD and comes to me today for ventral hernia.  The patient has noticed an increasing hernia in his abdominal wall for several years.  It was small, but has become larger.  And his weight has fluctuated.  Last fall his wife was found to have a brain tumor.  He had been married to her for 50 years.  She was [facility] at [facility] for almost 5 months before she died.  She died about 2 months ago.  He admits that he did not take good care of himself while he was taking care of her. He's now ready to address taking care of the ventral hernia.  He had an appendectomy around 1950.  This is his only abdominal surgery.  He has never had a colonoscopy, though Dr. Selena Batten has discussed this with him for  years.  He has not stomach, liver, pancreas, or colon disease.    Past Medical History  Diagnosis Date  . Hyperlipidemia   . Hypertension       Past Surgical History  Procedure Laterality Date  . Appendectomy  1950     Current Outpatient Prescriptions  Medication Sig Dispense Refill  . ALPRAZolam (XANAX) 0.5 MG tablet       . atorvastatin (LIPITOR) 80 MG tablet       . lisinopril (PRINIVIL,ZESTRIL) 10 MG tablet       . nystatin (MYCOSTATIN) 100000 UNIT/ML suspension       . warfarin (COUMADIN) 5 MG tablet       . diltiazem (CARDIZEM CD) 300 MG 24 hr capsule Take 1 capsule (300 mg total) by mouth daily. Needs office visit before additional refills  30 capsule  0  . metoprolol (LOPRESSOR) 50 MG tablet Take 1 tablet (50 mg total) by mouth daily. Needs office visit before additional refills done  30 tablet  0   No current facility-administered medications for this visit.     Not on File  REVIEW OF SYSTEMS: Skin:  No history of rash.  No history of abnormal moles. Infection:  Has recurrent oral thrush.  Cause unknown. Neurologic:  No history of stroke.  No history of  seizure.  No history of headaches. Cardiac:  Atrial fibrillation x 15 years.  Followed by Dr. Jeanella Cara.  There is a thorough note by Dr. Jacinto Halim explaining coming off of coumadin and the patient's decline for a stress test. Pulmonary:  Does not smoke cigarettes.  No asthma or bronchitis.  No OSA/CPAP.  Endocrine:  No diabetes. No thyroid disease. Gastrointestinal:  No history of stomach disease.  No history of liver disease.  No history of gall bladder disease.  No history of pancreas disease.  No history of colon disease. Urologic:  One episode of kidney stones. Musculoskeletal:  No history of joint or back disease. Hematologic:  On coumadin. Psycho-social:  The patient is oriented.   The patient has no obvious psychologic or social impairment to understanding our conversation and plan.  SOCIAL and FAMILY  HISTORY: Widowed.   Recently lost wife to brain tumor. Going to [employer] in [place] for [employer] event.  PHYSICAL EXAM: BP 130/80  Pulse 64  Temp(Src) 97.6 F (36.4 C) (Temporal)  Resp 14  Ht 6\' 2"  (1.88 m)  Wt 277 lb 6.4 oz (125.828 kg)  BMI 35.6 kg/m2  General: Obese WN WM who is alert and generally healthy appearing.  HEENT: Normal. Pupils equal.  Thin white hair. Neck: Supple. No mass.  No thyroid mass. Lymph Nodes:  No supraclavicular or cervical nodes. Lungs: Clear to auscultation and symmetric breath sounds. Heart:  Irregular rhythm consistent with A Fib. No murmur or rub. Abdomen: Obese. Soft. No mass. No tenderness. Normal bowel sounds.  RLQ scar.  Diastasis recti.  approx 4 to 5 cm defect immediately above the umbilicus with apporx 10 cm hernia sac.  He does have a diastasis recti, that is separate from the hernia. Rectal: Not done. Extremities:  Good strength and ROM  in upper and lower extremities. Neurologic:  Grossly intact to motor and sensory function. Psychiatric: Has normal mood and affect. Behavior is normal.   DATA REVIEWED: Epic, notes from Drs. Selena Batten and [place].  Ovidio Kin, MD,  [facility] [address]  [address]   [place], [place]    [address] Phone:  [phone] FAX:  [phone]

## 2013-06-23 NOTE — Interval H&P Note (Signed)
History and Physical Interval Note:  06/23/2013 1:06 PM  [name]  has presented today for surgery, with the diagnosis of ventral hernia  The various methods of treatment have been discussed with the patient and family.  Son, Roger Shelter, with patient.  INR is 1.1.  After consideration of risks, benefits and other options for treatment, the patient has consented to  Procedure(s): LAPAROSCOPIC VENTRAL HERNIA (N/A) as a surgical intervention .    The patient's history has been reviewed, patient examined, no change in status, stable for surgery.  I have reviewed the patient's chart and labs.  Questions were answered to the patient's satisfaction.     [name] H

## 2013-06-23 NOTE — Preoperative (Signed)
Beta Blockers   Reason not to administer Beta Blockers:Not Applicable 

## 2013-06-23 NOTE — Anesthesia Preprocedure Evaluation (Signed)
Anesthesia Evaluation  Patient identified by MRN, date of birth, ID band Patient awake    Reviewed: Allergy & Precautions, H&P , NPO status , Patient's Chart, lab work & pertinent test results  Airway Mallampati: II TM Distance: >3 FB Neck ROM: Full    Dental no notable dental hx.    Pulmonary neg pulmonary ROS,  breath sounds clear to auscultation  Pulmonary exam normal       Cardiovascular hypertension, Pt. on medications + dysrhythmias Atrial Fibrillation Rhythm:Irregular Rate:Normal     Neuro/Psych negative neurological ROS  negative psych ROS   GI/Hepatic negative GI ROS, Neg liver ROS,   Endo/Other  Morbid obesity  Renal/GU negative Renal ROS  negative genitourinary   Musculoskeletal negative musculoskeletal ROS (+)   Abdominal   Peds negative pediatric ROS (+)  Hematology negative hematology ROS (+)   Anesthesia Other Findings   Reproductive/Obstetrics negative OB ROS                           Anesthesia Physical Anesthesia Plan  ASA: III  Anesthesia Plan: General   Post-op Pain Management:    Induction: Intravenous  Airway Management Planned: Oral ETT  Additional Equipment:   Intra-op Plan:   Post-operative Plan: Extubation in OR  Informed Consent: I have reviewed the patients History and Physical, chart, labs and discussed the procedure including the risks, benefits and alternatives for the proposed anesthesia with the patient or authorized representative who has indicated his/her understanding and acceptance.   Dental advisory given  Plan Discussed with: CRNA and Surgeon  Anesthesia Plan Comments:         Anesthesia Quick Evaluation

## 2013-06-23 NOTE — Anesthesia Postprocedure Evaluation (Signed)
  Anesthesia Post-op Note  Patient: [name]  Procedure(s) Performed: Procedure(s) (LRB): LAPAROSCOPIC VENTRAL HERNIA (N/A)  Patient Location: PACU  Anesthesia Type: General  Level of Consciousness: awake and alert   Airway and Oxygen Therapy: Patient Spontanous Breathing  Post-op Pain: mild  Post-op Assessment: Post-op Vital signs reviewed, Patient's Cardiovascular Status Stable, Respiratory Function Stable, Patent Airway and No signs of Nausea or vomiting  Last Vitals:  Filed Vitals:   06/23/13 1445  BP: 156/94  Pulse: 68  Temp:   Resp: 16    Post-op Vital Signs: stable   Complications: No apparent anesthesia complications

## 2013-06-23 NOTE — Transfer of Care (Signed)
Immediate Anesthesia Transfer of Care Note  Patient: [name]  Procedure(s) Performed: Procedure(s) (LRB): LAPAROSCOPIC VENTRAL HERNIA (N/A)  Patient Location: PACU  Anesthesia Type: General  Level of Consciousness: sedated, patient cooperative and responds to stimulaton  Airway & Oxygen Therapy: Patient Spontanous Breathing and Patient connected to face mask oxgen  Post-op Assessment: Report given to PACU RN and Post -op Vital signs reviewed and stable  Post vital signs: Reviewed and stable  Complications: No apparent anesthesia complications

## 2013-06-24 ENCOUNTER — Encounter (HOSPITAL_COMMUNITY): Payer: Self-pay | Admitting: Surgery

## 2013-06-24 MED ORDER — HYDROCODONE-ACETAMINOPHEN 5-325 MG PO TABS: 1.0000 | ORAL_TABLET | Freq: Four times a day (QID) | ORAL | Status: DC | PRN

## 2013-06-24 NOTE — Care Management Note (Signed)
    Page 1 of 1   06/24/2013     9:42:54 AM   CARE MANAGEMENT NOTE 06/24/2013  Patient:  [name], [name]   Account Number:  [id number]  Date Initiated:  06/24/2013  Documentation initiated by:  Lorenda Ishihara  Subjective/Objective Assessment:   67 yo male admitted s/p ventral and umbilical hernia repair. PTA lived at home alone.     Action/Plan:   Home when stable   Anticipated DC Date:  06/24/2013   Anticipated DC Plan:  HOME/SELF CARE      DC Planning Services  CM consult      Choice offered to / List presented to:             Status of service:  Completed, signed off Medicare Important Message given?   (If response is "NO", the following Medicare IM given date fields will be blank) Date Medicare IM given:   Date Additional Medicare IM given:    Discharge Disposition:  HOME/SELF CARE  Per UR Regulation:  Reviewed for med. necessity/level of care/duration of stay  If discussed at Long Length of Stay Meetings, dates discussed:    Comments:

## 2013-06-24 NOTE — Discharge Instructions (Signed)
[facility] - DISCHARGE INSTRUCTIONS TO PATIENT  Activity:  Driving - May drive in 3 or 4 days, if doing well   Lifting - No lifting greater than 15 pounds for 1 month  Wound Care:   Leave wound dry for 2 more days, then may shower  Diet:  As tolerated  Follow up appointment:  Call Dr. Allene Pyo office [facility]) at [phone] for an appointment in 2 to 3 weeks.  Medications and dosages:  Resume your home medications.  You have a prescription for:  Vicodin  Call Dr. Ezzard Standing or his office  [phone]) if you have:  Temperature greater than 100.4,  Persistent nausea and vomiting,  Severe uncontrolled pain,  Redness, tenderness, or signs of infection (pain, swelling, redness, odor or green/yellow discharge around the site),  Difficulty breathing, headache or visual disturbances,  Any other questions or concerns you may have after discharge.  In an emergency, call 911 or go to an Emergency Department at a nearby hospital.

## 2013-06-24 NOTE — Progress Notes (Signed)
Rx for vicodin given to patient by Dr. Ezzard Standing, Md aware patient has onlly had clears states can adv diet at home. Patient states understanding of discharge instructions.

## 2013-06-24 NOTE — Discharge Summary (Signed)
Physician Discharge Summary  Patient ID:  [name]  MRN: [id number]  DOB/AGE: Nov 09, 1945 67 y.o.  Admit date: 06/23/2013 Discharge date: 06/24/2013  Discharge Diagnoses:  1. Incarcerated ventral hernia and umbilical hernia 2. Hypertension  3. Hyperlipidemia  4. A. Fib    Followed by Dr. Jacinto Halim  5. On coumadin   Followed by D. Ganji. 6. Recurrent thrush   He attributes to an immune disorder, but cannot specify why.  7. History of kidney stones  8. Claustophic  9. Diastasis recti   Operation: Procedure(s): LAPAROSCOPIC VENTRAL HERNIA on 06/23/2013  Discharged Condition: good  Hospital Course: [name] is an 67 y.o. male whose primary care physician is Pearson Grippe, MD and who was admitted 06/23/2013 with a chief complaint of ventral hernia.   He was brought to the operating room on 06/23/2013 and underwent  LAPAROSCOPIC VENTRAL HERNIA.   He is now one day post op.  He is doing well and tolerating a diet.  He is ready to go home.  He will restart his coumadin and be in touch with Dr. Verl Dicker office for follow up next week to check his coags.  The discharge instructions were reviewed with the patient.  Consults: None  Significant Diagnostic Studies: Results for orders placed during the hospital encounter of 06/23/13  [facility]      Result Value Range   Prothrombin Time 14.1  11.6 - 15.2 seconds   INR 1.11  0.00 - 1.49    Dg Chest 2 View  06/17/2013   *RADIOLOGY REPORT*  Clinical Data: Preop hernia repair  CHEST - 2 VIEW  Comparison: None.  Findings: Normal mediastinum and cardiac silhouette.  Normal pulmonary  vasculature.  No evidence of effusion, infiltrate, or pneumothorax.  No acute bony abnormality. . Degenerative osteophytosis of the thoracic spine.  IMPRESSION: No acute cardiopulmonary process.   Original Report Authenticated By: Genevive Bi, M.D.    Discharge Exam:  Filed Vitals:   06/24/13 0625  BP: 149/84  Pulse: 82  Temp: 98.2 F (36.8 C)   Resp: 18    General: WN older WM who is alert and generally healthy appearing.  Lungs: Clear to auscultation and symmetric breath sounds. Heart:  RRR. No murmur or rub. Abdomen: Soft. Normal bowel sounds. Has binder on.  Dressings are intact.  Discharge Medications:     Medication List         ALPRAZolam 0.5 MG tablet  Commonly known as:  XANAX  Take 0.5 mg by mouth 3 (three) times daily as needed for anxiety.     atorvastatin 80 MG tablet  Commonly known as:  LIPITOR  Take 80 mg by mouth every evening.     diltiazem 300 MG 24 hr capsule  Commonly known as:  CARDIZEM CD  Take 300 mg by mouth every morning.     HYDROcodone-acetaminophen 5-325 MG per tablet  Commonly known as:  NORCO/VICODIN  Take 1-2 tablets by mouth every 6 (six) hours as needed for pain.     lisinopril 10 MG tablet  Commonly known as:  PRINIVIL,ZESTRIL  Take 10 mg by mouth every morning.     LOVENOX 150 MG/ML injection  Generic drug:  enoxaparin  Inject 150 mg into the skin daily.     metoprolol 50 MG tablet  Commonly known as:  LOPRESSOR  Take 50 mg by mouth every morning.     nystatin 100000 UNIT/ML suspension  Commonly known as:  MYCOSTATIN  Take 200,000 Units by mouth daily  as needed (swish and spit daily as needed for thush).     warfarin 5 MG tablet  Commonly known as:  COUMADIN  Take 2.5-5 mg by mouth daily. Takes 1/2 tablet daily except for Thursday and Sunday he takes 1 tablet        Disposition:       Discharge Orders   Future Appointments Provider Department Dept Phone   07/10/2013 2:15 PM Kandis Cocking, MD [facility], [place] [phone]   Future Orders Complete By Expires   Diet - low sodium heart healthy  As directed    Increase activity slowly  As directed       Activity:  Driving - May drive in 3 or 4 days, if doing well   Lifting - No lifting greater than 15 pounds for 1 month  Wound Care:   Leave wound dry for 2 more days, then may shower  Diet:  As  tolerated  Follow up appointment:  Call Dr. Allene Pyo office [facility]) at [phone] for an appointment in 2 to 3 weeks.  Medications and dosages:  Resume your home medications.  You have a prescription for:  Vicodin  To restart his usual Coumadin dosage.  Signed: Ovidio Kin, M.D., FACS  06/24/2013, 8:26 AM

## 2013-07-10 ENCOUNTER — Encounter (INDEPENDENT_AMBULATORY_CARE_PROVIDER_SITE_OTHER): Payer: Self-pay | Admitting: Surgery

## 2013-07-10 ENCOUNTER — Ambulatory Visit (INDEPENDENT_AMBULATORY_CARE_PROVIDER_SITE_OTHER): Payer: Medicare Other | Admitting: Surgery

## 2013-07-10 VITALS — BP 128/66 | HR 68 | Resp 16 | Ht 74.0 in | Wt 269.2 lb

## 2013-07-10 DIAGNOSIS — Z8719 Personal history of other diseases of the digestive system: Secondary | ICD-10-CM

## 2013-07-10 DIAGNOSIS — Z9889 Other specified postprocedural states: Secondary | ICD-10-CM

## 2013-07-10 NOTE — Progress Notes (Signed)
POV  Doing well from ventral hernia repair.  To wear the abdominal binder for a total of 4 weeks.  No complaint or concern.  Complete office note - 06/04/2013.  BP 128/66  Pulse 68  Resp 16  Ht 6\' 2"  (1.88 m)  Wt 269 lb 3.2 oz (122.108 kg)  BMI 34.55 kg/m2  Abdomen:  Wounds look good.  There is a 4 cm mass where the hernia sac is.  This should resolve over the next several months.  Return to office PRN.  Ovidio Kin, MD, [facility] Pager: [phone] Office phone:  [phone]

## 2015-10-13 ENCOUNTER — Encounter: Payer: Self-pay | Admitting: Cardiology

## 2016-02-15 DIAGNOSIS — I4891 Unspecified atrial fibrillation: Secondary | ICD-10-CM | POA: Diagnosis not present

## 2016-02-15 DIAGNOSIS — I10 Essential (primary) hypertension: Secondary | ICD-10-CM | POA: Diagnosis not present

## 2016-02-23 DIAGNOSIS — I482 Chronic atrial fibrillation: Secondary | ICD-10-CM | POA: Diagnosis not present

## 2016-02-23 DIAGNOSIS — R739 Hyperglycemia, unspecified: Secondary | ICD-10-CM | POA: Diagnosis not present

## 2016-02-23 DIAGNOSIS — Z7901 Long term (current) use of anticoagulants: Secondary | ICD-10-CM | POA: Diagnosis not present

## 2016-02-23 DIAGNOSIS — Z6841 Body Mass Index (BMI) 40.0 and over, adult: Secondary | ICD-10-CM | POA: Diagnosis not present

## 2016-03-28 DIAGNOSIS — R739 Hyperglycemia, unspecified: Secondary | ICD-10-CM | POA: Diagnosis not present

## 2016-03-28 DIAGNOSIS — I480 Paroxysmal atrial fibrillation: Secondary | ICD-10-CM | POA: Diagnosis not present

## 2016-03-28 DIAGNOSIS — R21 Rash and other nonspecific skin eruption: Secondary | ICD-10-CM | POA: Diagnosis not present

## 2016-03-28 DIAGNOSIS — I10 Essential (primary) hypertension: Secondary | ICD-10-CM | POA: Diagnosis not present

## 2016-07-04 DIAGNOSIS — I10 Essential (primary) hypertension: Secondary | ICD-10-CM | POA: Diagnosis not present

## 2016-07-04 DIAGNOSIS — R739 Hyperglycemia, unspecified: Secondary | ICD-10-CM | POA: Diagnosis not present

## 2016-07-05 DIAGNOSIS — Z23 Encounter for immunization: Secondary | ICD-10-CM | POA: Diagnosis not present

## 2016-07-05 DIAGNOSIS — R739 Hyperglycemia, unspecified: Secondary | ICD-10-CM | POA: Diagnosis not present

## 2016-07-05 DIAGNOSIS — I480 Paroxysmal atrial fibrillation: Secondary | ICD-10-CM | POA: Diagnosis not present

## 2016-07-05 DIAGNOSIS — I10 Essential (primary) hypertension: Secondary | ICD-10-CM | POA: Diagnosis not present

## 2016-07-05 DIAGNOSIS — E7800 Pure hypercholesterolemia, unspecified: Secondary | ICD-10-CM | POA: Diagnosis not present

## 2016-07-20 DIAGNOSIS — L03312 Cellulitis of back [any part except buttock]: Secondary | ICD-10-CM | POA: Diagnosis not present

## 2016-07-24 DIAGNOSIS — Z7901 Long term (current) use of anticoagulants: Secondary | ICD-10-CM | POA: Diagnosis not present

## 2016-07-26 DIAGNOSIS — L03312 Cellulitis of back [any part except buttock]: Secondary | ICD-10-CM | POA: Diagnosis not present

## 2016-09-04 DIAGNOSIS — I482 Chronic atrial fibrillation: Secondary | ICD-10-CM | POA: Diagnosis not present

## 2016-09-04 DIAGNOSIS — Z6841 Body Mass Index (BMI) 40.0 and over, adult: Secondary | ICD-10-CM | POA: Diagnosis not present

## 2016-09-04 DIAGNOSIS — Z7901 Long term (current) use of anticoagulants: Secondary | ICD-10-CM | POA: Diagnosis not present

## 2016-09-04 DIAGNOSIS — R739 Hyperglycemia, unspecified: Secondary | ICD-10-CM | POA: Diagnosis not present

## 2016-09-18 DIAGNOSIS — Z7901 Long term (current) use of anticoagulants: Secondary | ICD-10-CM | POA: Diagnosis not present

## 2016-10-04 DIAGNOSIS — Z7901 Long term (current) use of anticoagulants: Secondary | ICD-10-CM | POA: Diagnosis not present

## 2016-10-12 DIAGNOSIS — R05 Cough: Secondary | ICD-10-CM | POA: Diagnosis not present

## 2016-11-29 DIAGNOSIS — Z7901 Long term (current) use of anticoagulants: Secondary | ICD-10-CM | POA: Diagnosis not present

## 2017-01-16 DIAGNOSIS — Z7901 Long term (current) use of anticoagulants: Secondary | ICD-10-CM | POA: Diagnosis not present

## 2017-01-30 DIAGNOSIS — Z7901 Long term (current) use of anticoagulants: Secondary | ICD-10-CM | POA: Diagnosis not present

## 2017-02-26 DIAGNOSIS — D649 Anemia, unspecified: Secondary | ICD-10-CM | POA: Diagnosis not present

## 2017-02-26 DIAGNOSIS — Z79899 Other long term (current) drug therapy: Secondary | ICD-10-CM | POA: Diagnosis not present

## 2017-02-26 DIAGNOSIS — R739 Hyperglycemia, unspecified: Secondary | ICD-10-CM | POA: Diagnosis not present

## 2017-02-26 DIAGNOSIS — Z7901 Long term (current) use of anticoagulants: Secondary | ICD-10-CM | POA: Diagnosis not present

## 2017-02-26 DIAGNOSIS — D638 Anemia in other chronic diseases classified elsewhere: Secondary | ICD-10-CM | POA: Diagnosis not present

## 2017-02-26 DIAGNOSIS — I10 Essential (primary) hypertension: Secondary | ICD-10-CM | POA: Diagnosis not present

## 2017-03-25 DIAGNOSIS — I10 Essential (primary) hypertension: Secondary | ICD-10-CM | POA: Diagnosis not present

## 2017-03-25 DIAGNOSIS — Z23 Encounter for immunization: Secondary | ICD-10-CM | POA: Diagnosis not present

## 2017-03-25 DIAGNOSIS — E7800 Pure hypercholesterolemia, unspecified: Secondary | ICD-10-CM | POA: Diagnosis not present

## 2017-03-25 DIAGNOSIS — R739 Hyperglycemia, unspecified: Secondary | ICD-10-CM | POA: Diagnosis not present

## 2017-03-25 DIAGNOSIS — H269 Unspecified cataract: Secondary | ICD-10-CM | POA: Diagnosis not present

## 2017-03-25 DIAGNOSIS — Z0000 Encounter for general adult medical examination without abnormal findings: Secondary | ICD-10-CM | POA: Diagnosis not present

## 2017-04-04 DIAGNOSIS — Z7901 Long term (current) use of anticoagulants: Secondary | ICD-10-CM | POA: Diagnosis not present

## 2017-04-04 DIAGNOSIS — Z6841 Body Mass Index (BMI) 40.0 and over, adult: Secondary | ICD-10-CM | POA: Diagnosis not present

## 2017-04-04 DIAGNOSIS — R739 Hyperglycemia, unspecified: Secondary | ICD-10-CM | POA: Diagnosis not present

## 2017-04-04 DIAGNOSIS — I482 Chronic atrial fibrillation: Secondary | ICD-10-CM | POA: Diagnosis not present

## 2017-04-10 DIAGNOSIS — I10 Essential (primary) hypertension: Secondary | ICD-10-CM | POA: Diagnosis not present

## 2017-04-10 DIAGNOSIS — I482 Chronic atrial fibrillation: Secondary | ICD-10-CM | POA: Diagnosis not present

## 2017-04-10 DIAGNOSIS — D509 Iron deficiency anemia, unspecified: Secondary | ICD-10-CM | POA: Diagnosis not present

## 2017-04-23 DIAGNOSIS — H401123 Primary open-angle glaucoma, left eye, severe stage: Secondary | ICD-10-CM | POA: Diagnosis not present

## 2017-04-23 DIAGNOSIS — H401112 Primary open-angle glaucoma, right eye, moderate stage: Secondary | ICD-10-CM | POA: Diagnosis not present

## 2017-04-23 DIAGNOSIS — H02831 Dermatochalasis of right upper eyelid: Secondary | ICD-10-CM | POA: Diagnosis not present

## 2017-04-23 DIAGNOSIS — H02834 Dermatochalasis of left upper eyelid: Secondary | ICD-10-CM | POA: Diagnosis not present

## 2017-04-23 DIAGNOSIS — H40013 Open angle with borderline findings, low risk, bilateral: Secondary | ICD-10-CM | POA: Diagnosis not present

## 2017-04-23 DIAGNOSIS — H5703 Miosis: Secondary | ICD-10-CM | POA: Diagnosis not present

## 2017-04-23 DIAGNOSIS — H25813 Combined forms of age-related cataract, bilateral: Secondary | ICD-10-CM | POA: Diagnosis not present

## 2017-04-23 DIAGNOSIS — H353131 Nonexudative age-related macular degeneration, bilateral, early dry stage: Secondary | ICD-10-CM | POA: Diagnosis not present

## 2017-04-25 ENCOUNTER — Other Ambulatory Visit: Payer: Self-pay

## 2017-04-25 ENCOUNTER — Ambulatory Visit
Admission: RE | Admit: 2017-04-25 | Discharge: 2017-04-25 | Disposition: A | Discharge status: Home / Self Care | Payer: Medicare HMO | Source: Ambulatory Visit | Attending: Gastroenterology | Admitting: Gastroenterology

## 2017-04-25 ENCOUNTER — Other Ambulatory Visit: Payer: Self-pay | Admitting: Gastroenterology

## 2017-04-25 DIAGNOSIS — K6389 Other specified diseases of intestine: Secondary | ICD-10-CM

## 2017-04-25 DIAGNOSIS — D125 Benign neoplasm of sigmoid colon: Secondary | ICD-10-CM | POA: Diagnosis not present

## 2017-04-25 DIAGNOSIS — D509 Iron deficiency anemia, unspecified: Secondary | ICD-10-CM | POA: Diagnosis not present

## 2017-04-25 DIAGNOSIS — C184 Malignant neoplasm of transverse colon: Secondary | ICD-10-CM | POA: Diagnosis not present

## 2017-04-25 DIAGNOSIS — C182 Malignant neoplasm of ascending colon: Secondary | ICD-10-CM | POA: Diagnosis not present

## 2017-04-25 DIAGNOSIS — D124 Benign neoplasm of descending colon: Secondary | ICD-10-CM | POA: Diagnosis not present

## 2017-04-25 DIAGNOSIS — D123 Benign neoplasm of transverse colon: Secondary | ICD-10-CM | POA: Diagnosis not present

## 2017-04-25 DIAGNOSIS — D131 Benign neoplasm of stomach: Secondary | ICD-10-CM | POA: Diagnosis not present

## 2017-04-25 DIAGNOSIS — K635 Polyp of colon: Secondary | ICD-10-CM | POA: Diagnosis not present

## 2017-04-25 DIAGNOSIS — K5730 Diverticulosis of large intestine without perforation or abscess without bleeding: Secondary | ICD-10-CM | POA: Diagnosis not present

## 2017-04-25 DIAGNOSIS — D374 Neoplasm of uncertain behavior of colon: Secondary | ICD-10-CM | POA: Diagnosis not present

## 2017-04-25 DIAGNOSIS — K317 Polyp of stomach and duodenum: Secondary | ICD-10-CM | POA: Diagnosis not present

## 2017-04-25 DIAGNOSIS — I700 Atherosclerosis of aorta: Secondary | ICD-10-CM | POA: Diagnosis not present

## 2017-04-25 MED ORDER — IOPAMIDOL (ISOVUE-300) INJECTION 61%
125.0000 mL | Freq: Once | INTRAVENOUS | Status: AC | PRN
  Administered 2017-04-25: 125 mL via INTRAVENOUS

## 2017-04-29 DIAGNOSIS — H2512 Age-related nuclear cataract, left eye: Secondary | ICD-10-CM | POA: Diagnosis not present

## 2017-04-29 DIAGNOSIS — H5703 Miosis: Secondary | ICD-10-CM | POA: Diagnosis not present

## 2017-04-29 DIAGNOSIS — H25812 Combined forms of age-related cataract, left eye: Secondary | ICD-10-CM | POA: Diagnosis not present

## 2017-05-03 ENCOUNTER — Encounter: Payer: Self-pay | Admitting: Oncology

## 2017-05-06 DIAGNOSIS — H2511 Age-related nuclear cataract, right eye: Secondary | ICD-10-CM | POA: Diagnosis not present

## 2017-05-13 ENCOUNTER — Other Ambulatory Visit: Payer: Self-pay | Admitting: General Surgery

## 2017-05-13 DIAGNOSIS — H2511 Age-related nuclear cataract, right eye: Secondary | ICD-10-CM | POA: Diagnosis not present

## 2017-05-13 DIAGNOSIS — C184 Malignant neoplasm of transverse colon: Secondary | ICD-10-CM | POA: Diagnosis not present

## 2017-05-13 DIAGNOSIS — H5703 Miosis: Secondary | ICD-10-CM | POA: Diagnosis not present

## 2017-05-13 DIAGNOSIS — H25811 Combined forms of age-related cataract, right eye: Secondary | ICD-10-CM | POA: Diagnosis not present

## 2017-05-13 DIAGNOSIS — H2181 Floppy iris syndrome: Secondary | ICD-10-CM | POA: Diagnosis not present

## 2017-05-13 DIAGNOSIS — C182 Malignant neoplasm of ascending colon: Secondary | ICD-10-CM | POA: Diagnosis not present

## 2017-05-14 DIAGNOSIS — Z7901 Long term (current) use of anticoagulants: Secondary | ICD-10-CM | POA: Diagnosis not present

## 2017-05-16 ENCOUNTER — Ambulatory Visit (HOSPITAL_BASED_OUTPATIENT_CLINIC_OR_DEPARTMENT_OTHER): Payer: Medicare HMO

## 2017-05-16 ENCOUNTER — Ambulatory Visit (HOSPITAL_BASED_OUTPATIENT_CLINIC_OR_DEPARTMENT_OTHER): Payer: Medicare HMO | Admitting: Oncology

## 2017-05-16 ENCOUNTER — Telehealth: Payer: Self-pay | Admitting: Oncology

## 2017-05-16 VITALS — BP 141/63 | HR 78 | Resp 18 | Ht 74.0 in | Wt 300.2 lb

## 2017-05-16 DIAGNOSIS — I10 Essential (primary) hypertension: Secondary | ICD-10-CM

## 2017-05-16 DIAGNOSIS — C184 Malignant neoplasm of transverse colon: Secondary | ICD-10-CM

## 2017-05-16 DIAGNOSIS — I4891 Unspecified atrial fibrillation: Secondary | ICD-10-CM | POA: Diagnosis not present

## 2017-05-16 DIAGNOSIS — C182 Malignant neoplasm of ascending colon: Secondary | ICD-10-CM | POA: Diagnosis not present

## 2017-05-16 DIAGNOSIS — D630 Anemia in neoplastic disease: Secondary | ICD-10-CM

## 2017-05-16 DIAGNOSIS — C188 Malignant neoplasm of overlapping sites of colon: Secondary | ICD-10-CM

## 2017-05-16 NOTE — Telephone Encounter (Signed)
Scheduled appt per 7/12 los - Gave patient AVS and calender per los.  

## 2017-05-16 NOTE — Progress Notes (Signed)
[name]   Referring MD: Ukiah Trawick 71 y.o.  11-14-1945    Reason for Referral: Colon cancer   HPI: [name] was found to have iron deficiency anemia by Dr. Maudie Mercury. [name] was referred to Dr. Benson Norway as was taken to an upper and lower endoscopy procedures on 04/25/2017. A single 5 L polyp was noted in the gastric cardia. The colonoscopy revealed large masses in the transverse colon and in the ascending colon. The masses were biopsied and tattooed. Multiple polyps were found in the sigmoid colon, descending colon, and transverse colon. The polyps were removed and retrieved. The ascending colon mass could not be passed.  The pathology revealed a hyperplastic gastric polyp. The ascending colon mass returned as moderately differentiated adenocarcinoma. The transverse colon mass biopsy returned as poorly differentiated adenocarcinoma. Polyps from the descending and sigmoid colon were hyperplastic polyps. A polyp from the transverse colon returned as a sessile serrated adenoma with low and high-grade dysplasia. There were additional fragments of a tubular adenoma from the transverse colon.  Tissue from the a sending and transverse colon mass is had loss of MLH1 and PMS2 expression.  [name] was referred to Dr. Barry Dienes and is being scheduled for surgery after a cardiology evaluation.  [name] felt well prior to undergoing a colonoscopy. [name] has developed insomnia and nausea since undergoing the colonoscopy procedure.  [name] was referred for staging CTs of the chest, abdomen, and pelvis on 04/25/2017. Multiple hypoattenuating pancreatic lesions. No liver lesion. Circumferential wall thickening at the cecum with small ileocolic lymph nodes. A separate circumferential lesion is noted in the distal transverse colon with nodular abnormal soft tissue in the adjacent fat suspicious for direct tumor extension.  Past Medical History:  Diagnosis Date  . Anxiety   .  Dysrhythmia     a fib  . Hyperlipidemia   . Hypertension    managed  . Pneumonia 1950    .  Glaucoma  Past Surgical History:  Procedure Laterality Date  . APPENDECTOMY  1950  . CARDIOVERSION    . VENTRAL HERNIA REPAIR N/A 06/23/2013   Procedure: LAPAROSCOPIC VENTRAL HERNIA;  Surgeon: Shann Medal, MD;  Location: [facility] ORS;  Service: General;  Laterality: N/A;   . Bilateral cataract surgery                                                                                                  June/July 2018 Medications: Reviewed  Allergies: No Known Allergies  Family history: 2 sisters and one brother. 2 children. A cousin had bladder cancer. No other family history of cancer  Social History:   [name] is retired from [employer]. [name] does not use cigarettes. [name] drinks wine with dinner 2-3 nights per week. No transfusion history. No risk factor for HIV or hepatitis.  ROS:   Positives include: Nausea for the past few weeks, insomnia since undergoing the colonoscopy  A complete ROS was otherwise negative.  Physical Exam:  Blood pressure (!) 141/63, pulse 78, resp. rate 18, height 6' 2" (1.88 m),  weight (!) 300 lb 3.2 oz (136.2 kg), SpO2 98 %.  HEENT: Oropharynx without visible mass, neck without mass Lungs: Clear bilaterally Cardiac: Irregular Abdomen: No hepatosplenomegaly, no mass, nontender GU: Uncircumcised male, testes without mass  Vascular: Trace edema at the low leg bilaterally Lymph nodes: No cervical, supraclavicular, axillary, or inguinal nodes Neurologic: Alert and oriented, the motor exam appears intact in the upper and lower extremities Skin: Stasis change at the low leg bilaterally, several superficial abrasion/ulcerations at the lower legs Musculoskeletal: No spine tenderness   LAB:  CEA-pending Imaging:  As per history of present illness, CT images from 04/25/2017-reviewed   Assessment/Plan:   1. Colon cancer-synchronous transverse and ascending  colon masses, and multiple polyps noted on colonoscopy 04/25/2017  Biopsies of the transverse and descending colon masses revealed adenocarcinoma with loss of MLH1 and PMS2 expression  Staging CT 04/25/2017 with synchronous masses at the cecum and transverse colon with evidence of locally metastatic lymph node/direct extension of tumor  2. Multiple polyps noted in the descending, sigmoid, and transverse colon on colonoscopy 04/25/2017-biopsy of a transverse colon polyp revealed low and high-grade dysplasia, other polyps returned as hyperplastic polyps and fragments of a tubular adenoma  3.   History of iron deficiency anemia secondary to #1  4.   Anxiety  5.   Hypertension  6.   Glaucoma  7.   Atrial fibrillation-maintained on Coumadin  8.    Multiple low-density pancreas lesions noted on CT 04/25/2017-likely benign   Disposition:   [name] has been diagnosed with colon cancer. [name] has synchronous colon primaries and multiple colonic polyps. The tumors have loss of MLH1 and PMS2 expression. 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 We will request BRAF testing on the 04/25/2017 biopsy.  [name] has seen Dr. Barry Dienes and is being scheduled for surgery. [name] will need to undergo a subtotal colectomy to resect the 2 invasive cancers an area of a polyp with high-grade dysplasia in the transverse colon.  We obtained a CEA today.  There is no clinical or CT evidence of distant metastatic disease.  I will see [name] after surgery to review the surgical pathology and discuss adjuvant treatment options.  [name] understands family members are at increased risk of developing colorectal cancer. They should receive appropriate screening. [name] will be referred to the genetic screening clinic.  50 minutes were spent with the patient today. The majority of the time was used for counseling and coordination of care.  Donneta Romberg, MD  05/16/2017, 6:09 PM

## 2017-05-17 ENCOUNTER — Telehealth: Payer: Self-pay | Admitting: *Deleted

## 2017-05-17 LAB — CEA (IN HOUSE-CHCC): CEA (CHCC-In House): 1.22 ng/mL (ref 0.00–5.00)

## 2017-05-17 NOTE — Telephone Encounter (Signed)
-----   Message from Ladell Pier, MD sent at 05/17/2017  9:46 AM EDT ----- Please call patient, cea is normal-good

## 2017-05-20 DIAGNOSIS — Z01810 Encounter for preprocedural cardiovascular examination: Secondary | ICD-10-CM | POA: Diagnosis not present

## 2017-05-20 DIAGNOSIS — I10 Essential (primary) hypertension: Secondary | ICD-10-CM | POA: Diagnosis not present

## 2017-05-20 DIAGNOSIS — I4891 Unspecified atrial fibrillation: Secondary | ICD-10-CM | POA: Diagnosis not present

## 2017-05-20 NOTE — Telephone Encounter (Signed)
Pt returned call, informed him of normal CEA.  

## 2017-05-22 DIAGNOSIS — Z7901 Long term (current) use of anticoagulants: Secondary | ICD-10-CM | POA: Diagnosis not present

## 2017-05-22 DIAGNOSIS — Z01818 Encounter for other preprocedural examination: Secondary | ICD-10-CM | POA: Diagnosis not present

## 2017-05-22 DIAGNOSIS — I482 Chronic atrial fibrillation: Secondary | ICD-10-CM | POA: Diagnosis not present

## 2017-05-22 DIAGNOSIS — I10 Essential (primary) hypertension: Secondary | ICD-10-CM | POA: Diagnosis not present

## 2017-06-03 NOTE — Pre-Procedure Instructions (Addendum)
[name]  06/03/2017      [id number] - [place], [place] - [address] AT [address] [place] [place] [address] Phone: [phone] Fax: [phone]  [facility] [address] [place] [address] Phone: [phone] Fax: [phone]    Your procedure is scheduled on August 2  Report to [place] at [place].M.  Call this number if you have problems the morning of surgery:  [phone]   Remember:  Do not eat food or drink liquids after midnight except follow these instructions  Continue all other medications as directed by your physician except follow these instructions about you medications  Please complete your 8oz of Boost Breeze or Water that was given to you at your preadmission appointment by 0330 on the day of your surgery     Take these medicines the morning of surgery with A SIP OF WATER ALPRAZolam (XANAX),  diltiazem (CARDIZEM CD), metoprolol (LOPRESSOR)  7 days prior to surgery STOP taking any Aspirin, Aleve, Naproxen, Ibuprofen, Motrin, Advil, Goody's, BC's, all herbal medications, fish oil, and all vitamins  Follow physician's Instructions about warfarin (COUMADIN)   Do not wear jewelry  Do not wear lotions, powders, or cologne, or deoderant.  Men may shave face and neck.  Do not bring valuables to the hospital.  [facility] is not responsible for any belongings or valuables.  Contacts, dentures or bridgework may not be worn into surgery.  Leave your suitcase in the car.  After surgery it may be brought to your room.  For patients admitted to the hospital, discharge time will be determined by your treatment team.  Patients discharged the day of surgery will not be allowed to drive home.    Special instructions:   [facility]- Preparing For Surgery  Before surgery, you can play an important role. Because skin  is not sterile, your skin needs to be as free of germs as possible. You can reduce the number of germs on your skin by washing with CHG (chlorahexidine gluconate) Soap before surgery.  CHG is an antiseptic cleaner which kills germs and bonds with the skin to continue killing germs even after washing.  Please do not use if you have an allergy to CHG or antibacterial soaps. If your skin becomes reddened/irritated stop using the CHG.  Do not shave (including legs and underarms) for at least 48 hours prior to first CHG shower. It is OK to shave your face.  Please follow these instructions carefully.   1. Shower the NIGHT BEFORE SURGERY and the MORNING OF SURGERY with CHG.   2. If you chose to wash your hair, wash your hair first as usual with your normal shampoo.  3. After you shampoo, rinse your hair and body thoroughly to remove the shampoo.  4. Use CHG as you would any other liquid soap. You can apply CHG directly to the skin and wash gently with a scrungie or a clean washcloth.   5. Apply the CHG Soap to your body ONLY FROM THE NECK DOWN.  Do not use on open wounds or open sores. Avoid contact with your eyes, ears, mouth and genitals (private parts). Wash genitals (private parts) with your normal soap.  6. Wash thoroughly, paying special attention to the area where your surgery will be performed.  7. Thoroughly rinse your body with warm water from the neck down.  8.  DO NOT shower/wash with your normal soap after using and rinsing off the CHG Soap.  9. Pat yourself dry with a CLEAN TOWEL.   10. Wear CLEAN PAJAMAS   11. Place CLEAN SHEETS on your bed the night of your first shower and DO NOT SLEEP WITH PETS.    Day of Surgery: Do not apply any deodorants/lotions. Please wear clean clothes to the hospital/surgery center.      Please read over the following fact sheets that you were given.

## 2017-06-03 NOTE — H&P (Signed)
NETANEL YANNUZZI 05/13/2017 3:04 PM Location: [facility] Patient #: [id number] DOB: [birth year] Widowed / Language: Cleophus Molt / Race: White Male   History of Present Illness Stark Klein MD; 05/13/2017 5:43 PM) The patient is a 71 year old male who presents with colorectal cancer. Patient is a 71 year old male who presents with a history of anemia. He is referred by Dr. Almyra Free for new diagnosis of colon cancer. 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 He did have an MSI absent. He has no family history of colon cancer. He states that in his family people either died at age 46-40 of a heart attack or lived to be 41 and died of old age. He denies any change in his bowel habits preceding the colonoscopy. He denies abdominal pain. He denies weight changes.  The patient's has history of atrial fibrillation and is on Coumadin for this. He is followed by Dr. Einar Gip with cardiology. He does not take a significant amount of medication despite being 310 pounds.  [facility] at [facility] EGD showed no helical Baxter and no metaplasia. Ascending colon showed moderately differentiated adenocarcinoma, transverse colon polyp showed sessile serrated adenoma with low and high-grade dysplasia transverse colon showed adenocarcinoma poorly differentiated.  He has undergone CT scanning which is negative for metastatic disease. He does have some cystic lesions in his pancreas. These do not appear to be consistent with malignancy.  CT abd/pelvis/chest 04/26/2017 IMPRESSION: 1. Two separate areas of abnormal colon identified. Imaging features compatible with synchronous neoplasm. 2. Circumferential mass lesion distal transverse colon with "apple core" morphology. Imaging features highly suspicious for neoplasm. Adjacent soft tissue nodules in the gastrocolic ligament measure up to about 15 mm. Some of these are  contiguous with the colonic wall and may represent transmural extension and/or metastatic deposits. 3. Second area of abnormal colon identified in the cecum, involving the ileocecal valve and associated wall thickening in the terminal ileum. This lesion is fairly extensive, extending from the cecal tip to a position distal to the ileocecal valve and is circumferential in nature. There is adjacent pericolonic soft tissue stranding and trace fluid in the adjacent right para colic gutter. Small lymph nodes are seen scattered in the ileocolic mesentery. Neoplasm is certainly a concern. 4. Multiple low-density lesions identified within the pancreatic parenchyma, likely cystic in would be atypical for metastatic colon cancer. Given the hypoattenuating nature of these lesions, their likely cystic and probably represent benign etiology. Follow-up abdominal MRI without and with contrast could be used for more definitive characterization. 5. Aortic Atherosclerois (ICD10-170.0)   Past Surgical History Malachy Moan, RMA; 05/13/2017 3:04 PM) Appendectomy  Cataract Surgery  Bilateral. Colon Polyp Removal - Open  Ventral / Umbilical Hernia Surgery  Right.  Diagnostic Studies History Malachy Moan, [place]; 05/13/2017 3:04 PM) Colonoscopy  within last year  Allergies Malachy Moan, RMA; 05/13/2017 3:09 PM) No Known Drug Allergies 05/13/2017  Medication History Malachy Moan, RMA; 05/13/2017 3:12 PM) ALPRAZolam (0.5MG Tablet, Oral) Active. Warfarin Sodium (5MG Tablet, Oral) Active. Atorvastatin Calcium (80MG Tablet, Oral) Active. Clotrimazole-Betamethasone (1-0.05% Cream, External) Active. Dorzolamide HCl-Timolol Mal (22.3-6.8MG/ML Solution, Ophthalmic) Active. Ketorolac Tromethamine (0.4% Solution, Ophthalmic) Active. Lisinopril (10MG Tablet, Oral) Active. Lotemax (0.5% Gel, Ophthalmic) Active. Metoprolol Tartrate (50MG Tablet, Oral) Active. Ofloxacin (0.3% Solution,  Ophthalmic) Active. Cartia XT (300MG Capsule ER 24HR, Oral) Active. Nystatin (1000000 UNIT Capsule, 200,000 units Oral daily as directed) Active. Medications Reconciled  Social History Malachy Moan, [place]; 05/13/2017 3:04 PM) Alcohol use  Moderate alcohol use. Caffeine use  Coffee. No drug use  Tobacco use  Never smoker.  Family History Malachy Moan, [place]; 05/13/2017 3:04 PM) Alcohol Abuse  Brother. Diabetes Mellitus  Sister. Heart Disease  Father. Heart disease in male family member before age 73  Hypertension  Father, Sister.  Other Problems Malachy Moan, [place]; 05/13/2017 3:04 PM) Anxiety Disorder  Atrial Fibrillation  High blood pressure  Hypercholesterolemia  Umbilical Hernia Repair     Review of Systems Malachy Moan RMA; 05/13/2017 3:04 PM) General Not Present- Appetite Loss, Chills, Fatigue, Fever, Night Sweats, Weight Gain and Weight Loss. Skin Not Present- Change in Wart/Mole, Dryness, Hives, Jaundice, New Lesions, Non-Healing Wounds, Rash and Ulcer. HEENT Present- Seasonal Allergies and Wears glasses/contact lenses. Not Present- Earache, Hearing Loss, Hoarseness, Nose Bleed, Oral Ulcers, Ringing in the Ears, Sinus Pain, Sore Throat, Visual Disturbances and Yellow Eyes. Respiratory Not Present- Bloody sputum, Chronic Cough, Difficulty Breathing, Snoring and Wheezing. Breast Not Present- Breast Mass, Breast Pain, Nipple Discharge and Skin Changes. Cardiovascular Not Present- Chest Pain, Difficulty Breathing Lying Down, Leg Cramps, Palpitations, Rapid Heart Rate, Shortness of Breath and Swelling of Extremities. Gastrointestinal Not Present- Abdominal Pain, Bloating, Bloody Stool, Change in Bowel Habits, Chronic diarrhea, Constipation, Difficulty Swallowing, Excessive gas, Gets full quickly at meals, Hemorrhoids, Indigestion, Nausea, Rectal Pain and Vomiting. Male Genitourinary Not Present- Blood in Urine, Change in Urinary Stream, Frequency, Impotence,  Nocturia, Painful Urination, Urgency and Urine Leakage. Musculoskeletal Not Present- Back Pain, Joint Pain, Joint Stiffness, Muscle Pain, Muscle Weakness and Swelling of Extremities. Neurological Not Present- Decreased Memory, Fainting, Headaches, Numbness, Seizures, Tingling, Tremor, Trouble walking and Weakness. Psychiatric Present- Anxiety. Not Present- Bipolar, Change in Sleep Pattern, Depression, Fearful and Frequent crying. Endocrine Not Present- Cold Intolerance, Excessive Hunger, Hair Changes, Heat Intolerance, Hot flashes and New Diabetes. Hematology Present- Blood Thinners. Not Present- Easy Bruising, Excessive bleeding, Gland problems, HIV and Persistent Infections.  Vitals Malachy Moan RMA; 05/13/2017 3:12 PM) 05/13/2017 3:12 PM Weight: 297.2 lb Height: 74.5in Body Surface Area: 2.58 m Body Mass Index: 37.65 kg/m  Temp.: 98.5F  Pulse: 68 (Regular)  BP: 150/60 (Sitting, Left Arm, Standard)       Physical Exam Stark Klein MD; 05/13/2017 5:44 PM) General Mental Status-Alert. General Appearance-Consistent with stated age. Hydration-Well hydrated. Voice-Normal.  Head and Neck Head-normocephalic, atraumatic with no lesions or palpable masses. Trachea-midline. Thyroid Gland Characteristics - normal size and consistency.  Eye Eyeball - Bilateral-Extraocular movements intact. Sclera/Conjunctiva - Bilateral-No scleral icterus.  Chest and Lung Exam Chest and lung exam reveals -quiet, even and easy respiratory effort with no use of accessory muscles and on auscultation, normal breath sounds, no adventitious sounds and normal vocal resonance. Inspection Chest Wall - Normal. Back - normal.  Cardiovascular Cardiovascular examination reveals -normal heart sounds, regular rate and rhythm with no murmurs and normal pedal pulses bilaterally.  Abdomen Inspection Inspection of the abdomen reveals - No Hernias. Palpation/Percussion Palpation  and Percussion of the abdomen reveal - Soft, Non Tender, No Rebound tenderness, No Rigidity (guarding) and No hepatosplenomegaly. Auscultation Auscultation of the abdomen reveals - Bowel sounds normal. Note: Protuberant. Umbilical hernia present. I cannot even find his prior appendectomy scar in the right lower quadrant. He has no areas of tenderness.   Neurologic Neurologic evaluation reveals -alert and oriented x 3 with no impairment of recent or remote memory. Mental Status-Normal.  Musculoskeletal Global Assessment -Note: no gross deformities.  Normal Exam - Left-Upper Extremity Strength Normal and Lower Extremity Strength Normal. Normal Exam -  Right-Upper Extremity Strength Normal and Lower Extremity Strength Normal.  Lymphatic Head & Neck  General Head & Neck Lymphatics: Bilateral - Description - Normal. Axillary  General Axillary Region: Bilateral - Description - Normal. Tenderness - Non Tender. Femoral & Inguinal  Generalized Femoral & Inguinal Lymphatics: Bilateral - Description - No Generalized lymphadenopathy.    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 We discussed the pros and cons of awaiting on genetics and doing a subtotal colectomy. I think that he would have significant diarrhea if this is case. Patient also does not appear to be super healthy.  I will plan to at least take the ascending and transverse colon. I will likely need to take down the splenic flexure. Think he couldn't have reasonable screening with flexible sigmoidoscopy. We will need to get clearance from his cardiologist to hold his Coumadin as well as risk stratification for surgery. He has seen his cardiologist within the last 6 weeks and so will likely not need another visit. However, he may need a stress test. Once we hear about that we will get him  set up for surgery. I reviewed risks of surgery extensively with the patient and his significant other. I discussed bleeding, infection, damage to adjacent structures, possible prolonged hospitalization, heart or lung complications, passed the ball wound issues or hernia. I discussed the likelihood of atrial fibrillation with rapid ventricular response and the possible need for prolonged hospitalization for this. I discussed the bowel prep. He understands and wishes to proceed. 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 You are being scheduled for surgery- Our schedulers will call you.  You should hear from our office's scheduling department within 5 working days about the location, date, and time of surgery. We try to make accommodations for patient's preferences in scheduling surgery, but sometimes the OR schedule or the surgeon's schedule prevents [place] from making those accommodations.  If you have not heard from our office [phone]) in 5 working days, call the office and ask for your surgeon's nurse.  If you have other questions about your diagnosis, plan, or surgery, call the office and ask for your surgeon's nurse.  Pt Education - [facility] Colon Bowel Prep 2015 Miralax/Antibiotics Started Neomycin Sulfate 500MG, 2 (two) Tablet SEE NOTE, #6, 05/13/2017, No Refill. Local Order: TAKE TWO TABLETS AT 2 PM, 3 PM, AND 10 PM THE DAY PRIOR TO SURGERY Started Flagyl 500MG, 2 (two) Tablet SEE NOTE, #6, 05/13/2017, No Refill. Local Order: Take at 2pm, 3pm, and 10pm the day prior to your colon operation   Signed by Stark Klein, MD (05/13/2017 5:46 PM)

## 2017-06-04 ENCOUNTER — Encounter (HOSPITAL_COMMUNITY): Payer: Self-pay

## 2017-06-04 ENCOUNTER — Encounter (HOSPITAL_COMMUNITY)
Admission: RE | Admit: 2017-06-04 | Discharge: 2017-06-04 | Disposition: A | Discharge status: Home / Self Care | Payer: Medicare HMO | Source: Ambulatory Visit | Attending: General Surgery | Admitting: General Surgery

## 2017-06-04 DIAGNOSIS — C184 Malignant neoplasm of transverse colon: Secondary | ICD-10-CM | POA: Diagnosis present

## 2017-06-04 DIAGNOSIS — C772 Secondary and unspecified malignant neoplasm of intra-abdominal lymph nodes: Secondary | ICD-10-CM | POA: Diagnosis not present

## 2017-06-04 DIAGNOSIS — Z79899 Other long term (current) drug therapy: Secondary | ICD-10-CM | POA: Diagnosis not present

## 2017-06-04 DIAGNOSIS — Z01818 Encounter for other preprocedural examination: Secondary | ICD-10-CM

## 2017-06-04 DIAGNOSIS — K567 Ileus, unspecified: Secondary | ICD-10-CM | POA: Diagnosis not present

## 2017-06-04 DIAGNOSIS — C182 Malignant neoplasm of ascending colon: Secondary | ICD-10-CM | POA: Diagnosis present

## 2017-06-04 DIAGNOSIS — F419 Anxiety disorder, unspecified: Secondary | ICD-10-CM | POA: Diagnosis present

## 2017-06-04 DIAGNOSIS — E669 Obesity, unspecified: Secondary | ICD-10-CM | POA: Diagnosis present

## 2017-06-04 DIAGNOSIS — D62 Acute posthemorrhagic anemia: Secondary | ICD-10-CM | POA: Diagnosis not present

## 2017-06-04 DIAGNOSIS — Z6837 Body mass index (BMI) 37.0-37.9, adult: Secondary | ICD-10-CM | POA: Diagnosis not present

## 2017-06-04 DIAGNOSIS — I4891 Unspecified atrial fibrillation: Secondary | ICD-10-CM | POA: Diagnosis present

## 2017-06-04 DIAGNOSIS — Z9842 Cataract extraction status, left eye: Secondary | ICD-10-CM | POA: Diagnosis not present

## 2017-06-04 DIAGNOSIS — E7800 Pure hypercholesterolemia, unspecified: Secondary | ICD-10-CM | POA: Diagnosis present

## 2017-06-04 DIAGNOSIS — Z7901 Long term (current) use of anticoagulants: Secondary | ICD-10-CM | POA: Diagnosis not present

## 2017-06-04 DIAGNOSIS — Z9841 Cataract extraction status, right eye: Secondary | ICD-10-CM | POA: Diagnosis not present

## 2017-06-04 DIAGNOSIS — I10 Essential (primary) hypertension: Secondary | ICD-10-CM | POA: Diagnosis present

## 2017-06-04 DIAGNOSIS — C180 Malignant neoplasm of cecum: Secondary | ICD-10-CM | POA: Diagnosis present

## 2017-06-04 LAB — CBC
HCT: 33.9 % — ABNORMAL LOW (ref 39.0–52.0)
Hemoglobin: 10.8 g/dL — ABNORMAL LOW (ref 13.0–17.0)
MCH: 24.7 pg — ABNORMAL LOW (ref 26.0–34.0)
MCHC: 31.9 g/dL (ref 30.0–36.0)
MCV: 77.6 fL — ABNORMAL LOW (ref 78.0–100.0)
Platelets: 325 10*3/uL (ref 150–400)
RBC: 4.37 MIL/uL (ref 4.22–5.81)
RDW: 16.3 % — AB (ref 11.5–15.5)
WBC: 12.7 10*3/uL — ABNORMAL HIGH (ref 4.0–10.5)

## 2017-06-04 LAB — PROTIME-INR
INR: 2.3
PROTHROMBIN TIME: 25.7 s — AB (ref 11.4–15.2)

## 2017-06-04 LAB — BASIC METABOLIC PANEL
Anion gap: 7 (ref 5–15)
BUN: 11 mg/dL (ref 6–20)
CALCIUM: 8.5 mg/dL — AB (ref 8.9–10.3)
CO2: 23 mmol/L (ref 22–32)
CREATININE: 0.97 mg/dL (ref 0.61–1.24)
Chloride: 106 mmol/L (ref 101–111)
GFR calc Af Amer: >60 mL/min (ref >60)
GLUCOSE: 131 mg/dL — AB (ref 65–99)
Potassium: 4.4 mmol/L (ref 3.5–5.1)
Sodium: 136 mmol/L (ref 135–145)

## 2017-06-04 NOTE — Progress Notes (Signed)
   06/04/17 0913  OBSTRUCTIVE SLEEP APNEA  Have you ever been diagnosed with sleep apnea through a sleep study? No  Do you snore loudly (loud enough to be heard through closed doors)?  0  Do you often feel tired, fatigued, or sleepy during the daytime (such as falling asleep during driving or talking to someone)? 0  Has anyone observed you stop breathing during your sleep? 0  Do you have, or are you being treated for high blood pressure? 1  BMI more than 35 kg/m2? 1  Age > 50 (1-yes) 1  Neck circumference greater than:Male 16 inches or larger, Male 17inches or larger? 1  Male Gender (Yes=1) 1  Obstructive Sleep Apnea Score 5  Score 5 or greater  Results sent to PCP

## 2017-06-04 NOTE — Progress Notes (Signed)
PCP - [name] Cardiologist - Dr. Adrian Prows  Chest x-ray - not needed EKG - requesting  Stress Test - requesting ECHO - requesting Cardiac Cath - denies  Last dose of coumadin 05/31/17 Patient was given bowel prep instructions  Sending to anesthesia for the review of records requested.  Sleep apnea score sent to PCP     Patient denies shortness of breath, fever, cough and chest pain at PAT appointment   Patient verbalized understanding of instructions that were given to them at the PAT appointment. Patient was also instructed that they will need to review over the PAT instructions again at home before surgery.

## 2017-06-05 MED ORDER — GENTAMICIN SULFATE 40 MG/ML IJ SOLN
INTRAMUSCULAR | Status: DC
  Filled 2017-06-05: qty 6

## 2017-06-05 NOTE — Anesthesia Preprocedure Evaluation (Addendum)
Anesthesia Evaluation  Patient identified by MRN, date of birth, ID band Patient awake    Reviewed: Allergy & Precautions, NPO status   Airway Mallampati: III  TM Distance: <3 FB Neck ROM: full    Dental  (+) Poor Dentition, Dental Advidsory Given   Pulmonary neg pulmonary ROS,    breath sounds clear to auscultation       Cardiovascular hypertension, Pt. on medications and Pt. on home beta blockers + dysrhythmias Atrial Fibrillation  Rhythm:Regular Rate:Normal  Echo 05/22/17 [facility] cardiovascular): 1. LV cavity normal in size. Mild concentric LVH. Normal global wall motion. Diastolic function could not be assessed properly due to A. fib. Calculated EF 66%. 2. LA cavity moderately dilated. 3. RA cavity mild to moderately dilated. 4. RV cavity slightly dilated. Normal RV function. 5. Mild aortic regurgitation. 6. Mild mitral regurgitation. Mild calcification of the mitral valve annulus. 7. Moderate tricuspid regurgitation. Mild pulmonary hypertension with approximate PA systolic pressure 39 mmHg  Nuclear stress test 05/20/17 [facility] cardiovascular): 1. Resting EKG demonstrates atrial fibrillation, PVCs and nonspecific T changes. Stress EKG is nondiagnostic for ischemia as is a pharmacologic stress. Patient did not develop symptoms. 2. Normal myocardial perfusion imaging study with a fixed defect suggesting an area of soft tissue attenuation. Overall LV systolic function normal without regional wall motion and modalities. LVEF calculated to be 77%. This is a low risk study.   Neuro/Psych negative neurological ROS     GI/Hepatic negative GI ROS, Neg liver ROS,   Endo/Other  negative endocrine ROS  Renal/GU negative Renal ROS     Musculoskeletal   Abdominal   Peds  Hematology On coumadin for afib   Anesthesia Other Findings   Reproductive/Obstetrics                           Lab Results   Component Value Date   WBC 10.3 06/06/2017   HGB 10.7 (L) 06/06/2017   HCT 33.8 (L) 06/06/2017   MCV 77.3 (L) 06/06/2017   PLT 325 06/06/2017   Lab Results  Component Value Date   CREATININE 0.88 06/06/2017   BUN 7 06/06/2017   NA 133 (L) 06/06/2017   K 3.7 06/06/2017   CL 106 06/06/2017   CO2 21 (L) 06/06/2017   Lab Results  Component Value Date   INR 1.91 06/06/2017   INR 2.30 06/04/2017   INR 1.11 06/23/2013    Anesthesia Physical Anesthesia Plan  ASA: III  Anesthesia Plan: General   Post-op Pain Management:    Induction: Intravenous  PONV Risk Score and Plan: 3 and Ondansetron, Dexamethasone, Midazolam and Treatment may vary due to age or medical condition  Airway Management Planned: Oral ETT  Additional Equipment:   Intra-op Plan:   Post-operative Plan: Extubation in OR  Informed Consent: I have reviewed the patients History and Physical, chart, labs and discussed the procedure including the risks, benefits and alternatives for the proposed anesthesia with the patient or authorized representative who has indicated his/her understanding and acceptance.   Dental Advisory Given  Plan Discussed with: Anesthesiologist, CRNA and Surgeon  Anesthesia Plan Comments:       Anesthesia Quick Evaluation

## 2017-06-05 NOTE — Progress Notes (Signed)
Anesthesia Chart Review:  Pt is a 71 year old male scheduled for laparoscopic partial cholecystectomy for colon cancer on 06/06/2017 with Stark Klein, MD  - PCP is [name], MD - Cardiologist is Kela Millin, MD who cleared pt for surgery and gave ok for pt to hold coumadin for 4 days before surgery.    PMH includes:  HTN, atrial fibrillation, hyperlipidemia. Never smoker. BMI 38.  Medications include: Lipitor, diltiazem, lisinopril, metoprolol, Coumadin. Last dose Coumadin 05/31/17.  Preoperative labs reviewed. PT 25.7.  Will repeat DOS.   CT chest 04/25/17:  - Cardiovascular: Heart size mildly enlarged. No pericardial effusion. Atherosclerotic calcification is noted in the wall of the thoracic aorta. - Mediastinum/Nodes: No mediastinal lymphadenopathy. There is no hilar lymphadenopathy. The esophagus has normal imaging features. 11 mm left thyroid nodule noted. There is no axillary lymphadenopathy. - Lungs/Pleura: Assessment of fine detail in the lung parenchyma obscured by patient breathing motion during image acquisition. Atelectasis noted in the dependent lower lungs. No definite pulmonary nodule or mass evident on this motion degraded study. No overt pulmonary edema. No pleural effusion.  EKG 04/04/17 [facility] cardiovascular): NSR  Echo 05/22/17 [facility] cardiovascular): 1. LV cavity normal in size. Mild concentric LVH. Normal global wall motion. Diastolic function could not be assessed properly due to A. fib. Calculated EF 66%. 2. LA cavity moderately dilated. 3. RA cavity mild to moderately dilated. 4. RV cavity slightly dilated. Normal RV function. 5. Mild aortic regurgitation. 6. Mild mitral regurgitation. Mild calcification of the mitral valve annulus. 7. Moderate tricuspid regurgitation. Mild pulmonary hypertension with approximate PA systolic pressure 39 mmHg  Nuclear stress test 05/20/17 [facility] cardiovascular): 1. Resting EKG demonstrates atrial fibrillation, PVCs and  nonspecific T changes. Stress EKG is nondiagnostic for ischemia as is a pharmacologic stress. Patient did not develop symptoms. 2. Normal myocardial perfusion imaging study with a fixed defect suggesting an area of soft tissue attenuation. Overall LV systolic function normal without regional wall motion and modalities. LVEF calculated to be 77%. This is a low risk study.  If no changes, I anticipate pt can proceed with surgery as scheduled.   Willeen Cass, FNP-BC [facility]/Anesthesiology Phone: [phone] 06/05/2017 1:32 PM

## 2017-06-06 ENCOUNTER — Inpatient Hospital Stay (HOSPITAL_COMMUNITY)
Admission: RE | Admit: 2017-06-06 | Discharge: 2017-06-11 | DRG: 330 | Disposition: A | Discharge status: Home / Self Care | Payer: Medicare HMO | Source: Ambulatory Visit | Attending: General Surgery | Admitting: General Surgery

## 2017-06-06 ENCOUNTER — Inpatient Hospital Stay (HOSPITAL_COMMUNITY): Payer: Medicare HMO | Admitting: Anesthesiology

## 2017-06-06 ENCOUNTER — Encounter (HOSPITAL_COMMUNITY)
Admission: RE | Disposition: A | Discharge status: Home / Self Care | Payer: Self-pay | Source: Ambulatory Visit | Attending: General Surgery

## 2017-06-06 ENCOUNTER — Encounter (HOSPITAL_COMMUNITY): Payer: Self-pay | Admitting: Anesthesiology

## 2017-06-06 ENCOUNTER — Inpatient Hospital Stay (HOSPITAL_COMMUNITY): Payer: Medicare HMO | Admitting: Emergency Medicine

## 2017-06-06 DIAGNOSIS — I10 Essential (primary) hypertension: Secondary | ICD-10-CM | POA: Diagnosis present

## 2017-06-06 DIAGNOSIS — Z79899 Other long term (current) drug therapy: Secondary | ICD-10-CM

## 2017-06-06 DIAGNOSIS — K567 Ileus, unspecified: Secondary | ICD-10-CM | POA: Diagnosis not present

## 2017-06-06 DIAGNOSIS — C189 Malignant neoplasm of colon, unspecified: Secondary | ICD-10-CM | POA: Diagnosis present

## 2017-06-06 DIAGNOSIS — D62 Acute posthemorrhagic anemia: Secondary | ICD-10-CM | POA: Diagnosis not present

## 2017-06-06 DIAGNOSIS — C182 Malignant neoplasm of ascending colon: Principal | ICD-10-CM | POA: Diagnosis present

## 2017-06-06 DIAGNOSIS — Z6837 Body mass index (BMI) 37.0-37.9, adult: Secondary | ICD-10-CM | POA: Diagnosis not present

## 2017-06-06 DIAGNOSIS — Z9841 Cataract extraction status, right eye: Secondary | ICD-10-CM | POA: Diagnosis not present

## 2017-06-06 DIAGNOSIS — C772 Secondary and unspecified malignant neoplasm of intra-abdominal lymph nodes: Secondary | ICD-10-CM | POA: Diagnosis not present

## 2017-06-06 DIAGNOSIS — Z7901 Long term (current) use of anticoagulants: Secondary | ICD-10-CM | POA: Diagnosis not present

## 2017-06-06 DIAGNOSIS — F419 Anxiety disorder, unspecified: Secondary | ICD-10-CM | POA: Diagnosis present

## 2017-06-06 DIAGNOSIS — Z9842 Cataract extraction status, left eye: Secondary | ICD-10-CM

## 2017-06-06 DIAGNOSIS — E669 Obesity, unspecified: Secondary | ICD-10-CM | POA: Diagnosis present

## 2017-06-06 DIAGNOSIS — C184 Malignant neoplasm of transverse colon: Secondary | ICD-10-CM | POA: Diagnosis present

## 2017-06-06 DIAGNOSIS — C180 Malignant neoplasm of cecum: Secondary | ICD-10-CM | POA: Diagnosis present

## 2017-06-06 DIAGNOSIS — I4891 Unspecified atrial fibrillation: Secondary | ICD-10-CM | POA: Diagnosis present

## 2017-06-06 DIAGNOSIS — E7800 Pure hypercholesterolemia, unspecified: Secondary | ICD-10-CM | POA: Diagnosis present

## 2017-06-06 HISTORY — PX: LAPAROSCOPIC PARTIAL COLECTOMY: SHX5907

## 2017-06-06 LAB — CBC WITH DIFFERENTIAL/PLATELET
Basophils Absolute: 0.1 10*3/uL (ref 0.0–0.1)
Basophils Relative: 1 %
EOS ABS: 0.4 10*3/uL (ref 0.0–0.7)
EOS PCT: 4 %
HCT: 33.8 % — ABNORMAL LOW (ref 39.0–52.0)
Hemoglobin: 10.7 g/dL — ABNORMAL LOW (ref 13.0–17.0)
LYMPHS ABS: 1.4 10*3/uL (ref 0.7–4.0)
Lymphocytes Relative: 13 %
MCH: 24.5 pg — AB (ref 26.0–34.0)
MCHC: 31.7 g/dL (ref 30.0–36.0)
MCV: 77.3 fL — AB (ref 78.0–100.0)
MONO ABS: 1.4 10*3/uL — AB (ref 0.1–1.0)
MONOS PCT: 14 %
Neutro Abs: 7.1 10*3/uL (ref 1.7–7.7)
Neutrophils Relative %: 68 %
PLATELETS: 325 10*3/uL (ref 150–400)
RBC: 4.37 MIL/uL (ref 4.22–5.81)
RDW: 16.4 % — ABNORMAL HIGH (ref 11.5–15.5)
WBC: 10.3 10*3/uL (ref 4.0–10.5)

## 2017-06-06 LAB — URINALYSIS, MICROSCOPIC (REFLEX)

## 2017-06-06 LAB — TYPE AND SCREEN
ABO/RH(D): A POS
ANTIBODY SCREEN: NEGATIVE

## 2017-06-06 LAB — COMPREHENSIVE METABOLIC PANEL
ALT: 22 U/L (ref 17–63)
ANION GAP: 6 (ref 5–15)
AST: 23 U/L (ref 15–41)
Albumin: 2.4 g/dL — ABNORMAL LOW (ref 3.5–5.0)
Alkaline Phosphatase: 61 U/L (ref 38–126)
BUN: 7 mg/dL (ref 6–20)
CALCIUM: 8.3 mg/dL — AB (ref 8.9–10.3)
CO2: 21 mmol/L — AB (ref 22–32)
CREATININE: 0.88 mg/dL (ref 0.61–1.24)
Chloride: 106 mmol/L (ref 101–111)
Glucose, Bld: 106 mg/dL — ABNORMAL HIGH (ref 65–99)
Potassium: 3.7 mmol/L (ref 3.5–5.1)
SODIUM: 133 mmol/L — AB (ref 135–145)
Total Bilirubin: 0.6 mg/dL (ref 0.3–1.2)
Total Protein: 5.9 g/dL — ABNORMAL LOW (ref 6.5–8.1)

## 2017-06-06 LAB — URINALYSIS, ROUTINE W REFLEX MICROSCOPIC
BILIRUBIN URINE: NEGATIVE
GLUCOSE, UA: 100 mg/dL — AB
KETONES UR: 15 mg/dL — AB
NITRITE: NEGATIVE
PH: 5.5 (ref 5.0–8.0)
PROTEIN: NEGATIVE mg/dL
Specific Gravity, Urine: >1.03 — ABNORMAL HIGH (ref 1.005–1.030)

## 2017-06-06 LAB — PROTIME-INR
INR: 1.91
Prothrombin Time: 22.2 seconds — ABNORMAL HIGH (ref 11.4–15.2)

## 2017-06-06 LAB — ABO/RH: ABO/RH(D): A POS

## 2017-06-06 SURGERY — LAPAROSCOPIC PARTIAL COLECTOMY
Anesthesia: General | Site: Abdomen

## 2017-06-06 MED ORDER — PROMETHAZINE HCL 25 MG/ML IJ SOLN: 6.2500 mg | INTRAMUSCULAR | Status: DC | PRN

## 2017-06-06 MED ORDER — DIPHENHYDRAMINE HCL 12.5 MG/5ML PO ELIX: 12.5000 mg | ORAL_SOLUTION | Freq: Four times a day (QID) | ORAL | Status: DC | PRN

## 2017-06-06 MED ORDER — GENTAMICIN SULFATE 40 MG/ML IJ SOLN
INTRAMUSCULAR | Status: DC
  Filled 2017-06-06: qty 6

## 2017-06-06 MED ORDER — PHENYLEPHRINE HCL 10 MG/ML IJ SOLN
INTRAMUSCULAR | Status: DC | PRN
  Administered 2017-06-06 (×3): 80 ug via INTRAVENOUS
  Administered 2017-06-06 (×2): 40 ug via INTRAVENOUS

## 2017-06-06 MED ORDER — ALVIMOPAN 12 MG PO CAPS
12.0000 mg | ORAL_CAPSULE | ORAL | Status: AC
  Administered 2017-06-06: 12 mg via ORAL
  Filled 2017-06-06: qty 1

## 2017-06-06 MED ORDER — DIPHENHYDRAMINE HCL 50 MG/ML IJ SOLN: 12.5000 mg | Freq: Four times a day (QID) | INTRAMUSCULAR | Status: DC | PRN

## 2017-06-06 MED ORDER — DORZOLAMIDE HCL-TIMOLOL MAL 2-0.5 % OP SOLN
1.0000 [drp] | Freq: Two times a day (BID) | OPHTHALMIC | Status: DC
  Administered 2017-06-07 – 2017-06-11 (×9): 1 [drp] via OPHTHALMIC
  Filled 2017-06-06: qty 10

## 2017-06-06 MED ORDER — ALBUMIN HUMAN 5 % IV SOLN
INTRAVENOUS | Status: DC | PRN
  Administered 2017-06-06 (×3): via INTRAVENOUS

## 2017-06-06 MED ORDER — MORPHINE SULFATE 2 MG/ML IV SOLN: INTRAVENOUS | Status: DC

## 2017-06-06 MED ORDER — CEFOTETAN DISODIUM-DEXTROSE 2-2.08 GM-% IV SOLR
2.0000 g | INTRAVENOUS | Status: AC
  Administered 2017-06-06: 2 g via INTRAVENOUS
  Filled 2017-06-06: qty 50

## 2017-06-06 MED ORDER — MORPHINE SULFATE 2 MG/ML IV SOLN
INTRAVENOUS | Status: DC
  Administered 2017-06-06: 14:00:00 via INTRAVENOUS
  Filled 2017-06-06: qty 30

## 2017-06-06 MED ORDER — NALOXONE HCL 0.4 MG/ML IJ SOLN: 0.4000 mg | INTRAMUSCULAR | Status: DC | PRN

## 2017-06-06 MED ORDER — ALPRAZOLAM 0.5 MG PO TABS
0.5000 mg | ORAL_TABLET | Freq: Three times a day (TID) | ORAL | Status: DC | PRN
  Administered 2017-06-06 – 2017-06-10 (×7): 0.5 mg via ORAL
  Filled 2017-06-06 (×8): qty 1

## 2017-06-06 MED ORDER — FENTANYL CITRATE (PF) 250 MCG/5ML IJ SOLN
INTRAMUSCULAR | Status: AC
  Filled 2017-06-06: qty 5

## 2017-06-06 MED ORDER — ALUM & MAG HYDROXIDE-SIMETH 200-200-20 MG/5ML PO SUSP: 30.0000 mL | Freq: Four times a day (QID) | ORAL | Status: DC | PRN

## 2017-06-06 MED ORDER — BUPIVACAINE ON-Q PAIN PUMP (FOR ORDER SET NO CHG): INJECTION | Status: DC

## 2017-06-06 MED ORDER — ONDANSETRON HCL 4 MG/2ML IJ SOLN: 4.0000 mg | Freq: Four times a day (QID) | INTRAMUSCULAR | Status: DC | PRN

## 2017-06-06 MED ORDER — METOPROLOL TARTRATE 25 MG PO TABS
25.0000 mg | ORAL_TABLET | Freq: Two times a day (BID) | ORAL | Status: DC
  Administered 2017-06-07 – 2017-06-11 (×7): 25 mg via ORAL
  Filled 2017-06-06 (×7): qty 1

## 2017-06-06 MED ORDER — ARTIFICIAL TEARS OPHTHALMIC OINT
TOPICAL_OINTMENT | OPHTHALMIC | Status: AC
  Filled 2017-06-06: qty 3.5

## 2017-06-06 MED ORDER — DIPHENHYDRAMINE HCL 12.5 MG/5ML PO ELIX
12.5000 mg | ORAL_SOLUTION | Freq: Four times a day (QID) | ORAL | Status: DC | PRN
  Filled 2017-06-06: qty 5

## 2017-06-06 MED ORDER — HYDRALAZINE HCL 20 MG/ML IJ SOLN: 10.0000 mg | INTRAMUSCULAR | Status: DC | PRN

## 2017-06-06 MED ORDER — LACTATED RINGERS IV SOLN
INTRAVENOUS | Status: DC | PRN
  Administered 2017-06-06: 07:00:00 via INTRAVENOUS

## 2017-06-06 MED ORDER — DILTIAZEM HCL ER COATED BEADS 300 MG PO CP24
300.0000 mg | ORAL_CAPSULE | Freq: Every morning | ORAL | Status: DC
  Administered 2017-06-06 – 2017-06-09 (×3): 300 mg via ORAL
  Filled 2017-06-06 (×5): qty 1

## 2017-06-06 MED ORDER — PHENYLEPHRINE 40 MCG/ML (10ML) SYRINGE FOR IV PUSH (FOR BLOOD PRESSURE SUPPORT)
PREFILLED_SYRINGE | INTRAVENOUS | Status: AC
  Filled 2017-06-06: qty 10

## 2017-06-06 MED ORDER — DEXTROSE 5 % IV SOLN
2.0000 g | Freq: Two times a day (BID) | INTRAVENOUS | Status: DC
  Filled 2017-06-06: qty 2

## 2017-06-06 MED ORDER — SODIUM CHLORIDE 0.9% FLUSH: 9.0000 mL | INTRAVENOUS | Status: DC | PRN

## 2017-06-06 MED ORDER — MORPHINE SULFATE 2 MG/ML IV SOLN
INTRAVENOUS | Status: DC
  Administered 2017-06-06: 7 mg via INTRAVENOUS
  Administered 2017-06-06: 15:00:00 via INTRAVENOUS
  Administered 2017-06-07: 1 mg via INTRAVENOUS
  Administered 2017-06-07: 2 mg via INTRAVENOUS

## 2017-06-06 MED ORDER — LISINOPRIL 10 MG PO TABS
10.0000 mg | ORAL_TABLET | Freq: Every morning | ORAL | Status: DC
  Administered 2017-06-07 – 2017-06-08 (×2): 10 mg via ORAL
  Filled 2017-06-06 (×3): qty 1

## 2017-06-06 MED ORDER — CHLORHEXIDINE GLUCONATE CLOTH 2 % EX PADS: 6.0000 | MEDICATED_PAD | Freq: Once | CUTANEOUS | Status: DC

## 2017-06-06 MED ORDER — ACETAMINOPHEN 325 MG PO TABS
650.0000 mg | ORAL_TABLET | ORAL | Status: DC | PRN
  Administered 2017-06-07 – 2017-06-10 (×5): 650 mg via ORAL
  Filled 2017-06-06 (×5): qty 2

## 2017-06-06 MED ORDER — BUPIVACAINE-EPINEPHRINE (PF) 0.25% -1:200000 IJ SOLN
INTRAMUSCULAR | Status: DC | PRN
  Administered 2017-06-06: 16 mL via INTRAMUSCULAR

## 2017-06-06 MED ORDER — MIDAZOLAM HCL 2 MG/2ML IJ SOLN
INTRAMUSCULAR | Status: AC
  Filled 2017-06-06: qty 2

## 2017-06-06 MED ORDER — DEXAMETHASONE SODIUM PHOSPHATE 10 MG/ML IJ SOLN
INTRAMUSCULAR | Status: DC | PRN
  Administered 2017-06-06: 10 mg via INTRAVENOUS

## 2017-06-06 MED ORDER — PHENYLEPHRINE HCL 10 MG/ML IJ SOLN
INTRAVENOUS | Status: DC | PRN
  Administered 2017-06-06: 25 ug/min via INTRAVENOUS

## 2017-06-06 MED ORDER — KCL IN DEXTROSE-NACL 20-5-0.45 MEQ/L-%-% IV SOLN
INTRAVENOUS | Status: DC
  Administered 2017-06-06 (×2): via INTRAVENOUS
  Filled 2017-06-06 (×2): qty 1000

## 2017-06-06 MED ORDER — FENTANYL CITRATE (PF) 100 MCG/2ML IJ SOLN
INTRAMUSCULAR | Status: DC | PRN
  Administered 2017-06-06 (×4): 50 ug via INTRAVENOUS
  Administered 2017-06-06: 100 ug via INTRAVENOUS
  Administered 2017-06-06: 50 ug via INTRAVENOUS

## 2017-06-06 MED ORDER — SUCCINYLCHOLINE CHLORIDE 20 MG/ML IJ SOLN
INTRAMUSCULAR | Status: DC | PRN
  Administered 2017-06-06: 140 mg via INTRAVENOUS

## 2017-06-06 MED ORDER — MIDAZOLAM HCL 5 MG/5ML IJ SOLN
INTRAMUSCULAR | Status: DC | PRN
  Administered 2017-06-06: 2 mg via INTRAVENOUS

## 2017-06-06 MED ORDER — MORPHINE SULFATE (PF) 4 MG/ML IV SOLN
1.0000 mg | INTRAVENOUS | Status: DC | PRN
Start: 2017-06-06 — End: 2017-06-11

## 2017-06-06 MED ORDER — LIDOCAINE HCL (CARDIAC) 20 MG/ML IV SOLN
INTRAVENOUS | Status: DC | PRN
  Administered 2017-06-06: 60 mg via INTRAVENOUS

## 2017-06-06 MED ORDER — PROPOFOL 10 MG/ML IV BOLUS
INTRAVENOUS | Status: DC | PRN
Start: 2017-06-06 — End: 2017-06-06
  Administered 2017-06-06: 200 mg via INTRAVENOUS

## 2017-06-06 MED ORDER — KCL IN DEXTROSE-NACL 20-5-0.45 MEQ/L-%-% IV SOLN
INTRAVENOUS | Status: AC
  Administered 2017-06-06: 1000 mL
  Filled 2017-06-06: qty 1000

## 2017-06-06 MED ORDER — ATORVASTATIN CALCIUM 80 MG PO TABS
80.0000 mg | ORAL_TABLET | Freq: Every evening | ORAL | Status: DC
  Administered 2017-06-06 – 2017-06-10 (×5): 80 mg via ORAL
  Filled 2017-06-06 (×5): qty 1

## 2017-06-06 MED ORDER — 0.9 % SODIUM CHLORIDE (POUR BTL) OPTIME
TOPICAL | Status: DC | PRN
  Administered 2017-06-06 (×4): 1000 mL

## 2017-06-06 MED ORDER — ZOLPIDEM TARTRATE 5 MG PO TABS: 5.0000 mg | ORAL_TABLET | Freq: Every evening | ORAL | Status: DC | PRN

## 2017-06-06 MED ORDER — BUPIVACAINE-EPINEPHRINE (PF) 0.25% -1:200000 IJ SOLN
INTRAMUSCULAR | Status: AC
  Filled 2017-06-06: qty 30

## 2017-06-06 MED ORDER — HYDROMORPHONE HCL 1 MG/ML IJ SOLN
0.2500 mg | INTRAMUSCULAR | Status: DC | PRN
Start: 2017-06-06 — End: 2017-06-06

## 2017-06-06 MED ORDER — ONDANSETRON HCL 4 MG PO TABS: 4.0000 mg | ORAL_TABLET | Freq: Four times a day (QID) | ORAL | Status: DC | PRN

## 2017-06-06 MED ORDER — DIPHENHYDRAMINE HCL 50 MG/ML IJ SOLN
12.5000 mg | Freq: Four times a day (QID) | INTRAMUSCULAR | Status: DC | PRN
Start: 2017-06-06 — End: 2017-06-06

## 2017-06-06 MED ORDER — PROPOFOL 10 MG/ML IV BOLUS
INTRAVENOUS | Status: AC
  Filled 2017-06-06: qty 20

## 2017-06-06 MED ORDER — SODIUM CHLORIDE 0.9 % IR SOLN
Status: DC | PRN
  Administered 2017-06-06: 1000 mL

## 2017-06-06 MED ORDER — ROCURONIUM BROMIDE 100 MG/10ML IV SOLN
INTRAVENOUS | Status: DC | PRN
  Administered 2017-06-06: 20 mg via INTRAVENOUS
  Administered 2017-06-06: 10 mg via INTRAVENOUS
  Administered 2017-06-06: 20 mg via INTRAVENOUS
  Administered 2017-06-06 (×2): 10 mg via INTRAVENOUS
  Administered 2017-06-06: 70 mg via INTRAVENOUS

## 2017-06-06 MED ORDER — LIDOCAINE HCL (PF) 1 % IJ SOLN
INTRAMUSCULAR | Status: AC
  Filled 2017-06-06: qty 30

## 2017-06-06 MED ORDER — SUGAMMADEX SODIUM 500 MG/5ML IV SOLN
INTRAVENOUS | Status: DC | PRN
  Administered 2017-06-06: 500 mg via INTRAVENOUS

## 2017-06-06 MED ORDER — BUPIVACAINE 0.5 % ON-Q PUMP DUAL CATH 300 ML
300.0000 mL | INJECTION | Status: DC
  Filled 2017-06-06: qty 300

## 2017-06-06 MED ORDER — ALVIMOPAN 12 MG PO CAPS
12.0000 mg | ORAL_CAPSULE | Freq: Two times a day (BID) | ORAL | Status: DC
  Administered 2017-06-07 (×2): 12 mg via ORAL
  Filled 2017-06-06 (×3): qty 1

## 2017-06-06 MED ORDER — LOTEPREDNOL ETABONATE 0.5 % OP SUSP
1.0000 [drp] | Freq: Four times a day (QID) | OPHTHALMIC | Status: DC | PRN
  Administered 2017-06-06 – 2017-06-07 (×2): 1 [drp] via OPHTHALMIC
  Filled 2017-06-06 (×3): qty 5

## 2017-06-06 SURGICAL SUPPLY — 79 items
APPLIER CLIP ROT 10 11.4 M/L (STAPLE) ×3
APR CLP MED LRG 11.4X10 (STAPLE) ×1
BLADE CLIPPER SURG (BLADE) ×2 IMPLANT
CANISTER SUCT 3000ML PPV (MISCELLANEOUS) ×3 IMPLANT
CATH KIT ON-Q SILVERSOAK 5 (CATHETERS) IMPLANT
CATH KIT ON-Q SILVERSOAK 5IN (CATHETERS) ×3 IMPLANT
CHLORAPREP W/TINT 26ML (MISCELLANEOUS) ×3 IMPLANT
CLIP APPLIE ROT 10 11.4 M/L (STAPLE) IMPLANT
COVER MAYO STAND STRL (DRAPES) ×3 IMPLANT
COVER SURGICAL LIGHT HANDLE (MISCELLANEOUS) ×6 IMPLANT
DRAPE UTILITY XL STRL (DRAPES) ×7 IMPLANT
DRAPE WARM FLUID 44X44 (DRAPE) ×3 IMPLANT
DRSG OPSITE POSTOP 4X8 (GAUZE/BANDAGES/DRESSINGS) ×2 IMPLANT
DRSG TEGADERM 2-3/8X2-3/4 SM (GAUZE/BANDAGES/DRESSINGS) ×2 IMPLANT
ELECT BLADE 6.5 EXT (BLADE) ×3 IMPLANT
ELECT CAUTERY BLADE 6.4 (BLADE) ×6 IMPLANT
ELECT REM PT RETURN 9FT ADLT (ELECTROSURGICAL) ×3
ELECTRODE REM PT RTRN 9FT ADLT (ELECTROSURGICAL) ×1 IMPLANT
GAUZE SPONGE 2X2 8PLY STRL LF (GAUZE/BANDAGES/DRESSINGS) IMPLANT
GLOVE BIO SURGEON STRL SZ 6 (GLOVE) ×6 IMPLANT
GLOVE BIO SURGEON STRL SZ7 (GLOVE) ×2 IMPLANT
GLOVE BIO SURGEON STRL SZ8 (GLOVE) ×2 IMPLANT
GLOVE BIOGEL PI IND STRL 6 (GLOVE) IMPLANT
GLOVE BIOGEL PI IND STRL 6.5 (GLOVE) IMPLANT
GLOVE BIOGEL PI IND STRL 7.0 (GLOVE) IMPLANT
GLOVE BIOGEL PI IND STRL 7.5 (GLOVE) IMPLANT
GLOVE BIOGEL PI IND STRL 8 (GLOVE) IMPLANT
GLOVE BIOGEL PI INDICATOR 6 (GLOVE) ×2
GLOVE BIOGEL PI INDICATOR 6.5 (GLOVE) ×4
GLOVE BIOGEL PI INDICATOR 7.0 (GLOVE) ×2
GLOVE BIOGEL PI INDICATOR 7.5 (GLOVE) ×2
GLOVE BIOGEL PI INDICATOR 8 (GLOVE) ×2
GLOVE ECLIPSE 6.0 STRL STRAW (GLOVE) ×4 IMPLANT
GLOVE ECLIPSE 7.5 STRL STRAW (GLOVE) ×2 IMPLANT
GLOVE INDICATOR 6.5 STRL GRN (GLOVE) ×4 IMPLANT
GLOVE SURG SS PI 6.0 STRL IVOR (GLOVE) ×2 IMPLANT
GOWN STRL REUS W/ TWL LRG LVL3 (GOWN DISPOSABLE) ×6 IMPLANT
GOWN STRL REUS W/ TWL XL LVL3 (GOWN DISPOSABLE) IMPLANT
GOWN STRL REUS W/TWL 2XL LVL3 (GOWN DISPOSABLE) ×6 IMPLANT
GOWN STRL REUS W/TWL LRG LVL3 (GOWN DISPOSABLE) ×9
GOWN STRL REUS W/TWL XL LVL3 (GOWN DISPOSABLE) ×6
KIT BASIN OR (CUSTOM PROCEDURE TRAY) ×3 IMPLANT
KIT ROOM TURNOVER OR (KITS) ×3 IMPLANT
L-HOOK LAP DISP 36CM (ELECTROSURGICAL) ×3
LHOOK LAP DISP 36CM (ELECTROSURGICAL) ×1 IMPLANT
NS IRRIG 1000ML POUR BTL (IV SOLUTION) ×10 IMPLANT
PAD ARMBOARD 7.5X6 YLW CONV (MISCELLANEOUS) ×4 IMPLANT
PENCIL BUTTON HOLSTER BLD 10FT (ELECTRODE) ×7 IMPLANT
RELOAD PROXIMATE 75MM BLUE (ENDOMECHANICALS) ×6 IMPLANT
RELOAD STAPLE 75 3.8 BLU REG (ENDOMECHANICALS) IMPLANT
SCISSORS LAP 5X35 DISP (ENDOMECHANICALS) ×3 IMPLANT
SET IRRIG TUBING LAPAROSCOPIC (IRRIGATION / IRRIGATOR) ×2 IMPLANT
SLEEVE ENDOPATH XCEL 5M (ENDOMECHANICALS) ×17 IMPLANT
SPECIMEN JAR LARGE (MISCELLANEOUS) ×3 IMPLANT
SPONGE GAUZE 2X2 STER 10/PKG (GAUZE/BANDAGES/DRESSINGS) ×2
STAPLER GUN LINEAR PROX 60 (STAPLE) ×2 IMPLANT
STAPLER PROXIMATE 75MM BLUE (STAPLE) ×2 IMPLANT
STAPLER VISISTAT 35W (STAPLE) ×3 IMPLANT
SUCTION POOLE TIP (SUCTIONS) ×3 IMPLANT
SUT PDS AB 1 CT  36 (SUTURE) ×4
SUT PDS AB 1 CT 36 (SUTURE) IMPLANT
SUT VIC AB 2-0 CT1 27 (SUTURE) ×6
SUT VIC AB 2-0 CT1 TAPERPNT 27 (SUTURE) IMPLANT
SUT VIC AB 2-0 SH 18 (SUTURE) ×5 IMPLANT
SUT VIC AB 3-0 SH 18 (SUTURE) ×3 IMPLANT
SUT VICRYL AB 2 0 TIES (SUTURE) ×3 IMPLANT
SUT VICRYL AB 3 0 TIES (SUTURE) ×3 IMPLANT
SYR BULB IRRIGATION 50ML (SYRINGE) ×3 IMPLANT
SYS LAPSCP GELPORT 120MM (MISCELLANEOUS) ×3
SYSTEM LAPSCP GELPORT 120MM (MISCELLANEOUS) IMPLANT
TOWEL OR 17X26 10 PK STRL BLUE (TOWEL DISPOSABLE) ×6 IMPLANT
TRAY FOLEY W/METER SILVER 16FR (SET/KITS/TRAYS/PACK) ×2 IMPLANT
TRAY LAPAROSCOPIC MC (CUSTOM PROCEDURE TRAY) ×3 IMPLANT
TROCAR XCEL NON-BLD 5MMX100MML (ENDOMECHANICALS) ×3 IMPLANT
TUBE CONNECTING 12'X1/4 (SUCTIONS) ×2
TUBE CONNECTING 12X1/4 (SUCTIONS) ×4 IMPLANT
TUBING INSUF HEATED (TUBING) ×3 IMPLANT
TUNNELER SHEATH ON-Q 16GX12 DP (PAIN MANAGEMENT) ×2 IMPLANT
YANKAUER SUCT BULB TIP NO VENT (SUCTIONS) ×6 IMPLANT

## 2017-06-06 NOTE — Anesthesia Postprocedure Evaluation (Signed)
Anesthesia Post Note  Patient: [name]  Procedure(s) Performed: Procedure(s) (LRB): LAPAROSCOPIC PARTIAL COLECTOMY (N/A)     Patient location during evaluation: PACU Anesthesia Type: General Level of consciousness: awake and alert Pain management: pain level controlled Vital Signs Assessment: post-procedure vital signs reviewed and stable Respiratory status: spontaneous breathing, nonlabored ventilation, respiratory function stable and patient connected to nasal cannula oxygen Cardiovascular status: blood pressure returned to baseline and stable Postop Assessment: no signs of nausea or vomiting Anesthetic complications: no    Last Vitals:  Vitals:   06/06/17 0631  BP: (!) 145/69  Pulse: 77  Resp: 20  Temp: 36.7 C    Last Pain:  Vitals:   06/06/17 0631  TempSrc: Oral  PainSc:                  [name]

## 2017-06-06 NOTE — Anesthesia Procedure Notes (Signed)
Procedure Name: Intubation Date/Time: 06/06/2017 7:50 AM Performed by: Neldon Newport Pre-anesthesia Checklist: Timeout performed, Patient being monitored, Suction available, Emergency Drugs available and Patient identified Patient Re-evaluated:Patient Re-evaluated prior to induction Oxygen Delivery Method: Circle system utilized Preoxygenation: Pre-oxygenation with 100% oxygen Induction Type: IV induction Ventilation: Mask ventilation without difficulty Laryngoscope Size: Glidescope and 4 Grade View: Grade I Tube type: Oral Tube size: 7.5 mm Number of attempts: 1 Placement Confirmation: breath sounds checked- equal and bilateral,  positive ETCO2 and ETT inserted through vocal cords under direct vision Secured at: 23 cm Tube secured with: Tape Dental Injury: Teeth and Oropharynx as per pre-operative assessment

## 2017-06-06 NOTE — Interval H&P Note (Signed)
History and Physical Interval Note:  06/06/2017 7:27 AM  [name]  has presented today for surgery, with the diagnosis of ascending and transverse colon cancer  The various methods of treatment have been discussed with the patient and family. After consideration of risks, benefits and other options for treatment, the patient has consented to  Procedure(s): LAPAROSCOPIC PARTIAL COLECTOMY (N/A) as a surgical intervention .  The patient's history has been reviewed, patient examined, no change in status, stable for surgery.  I have reviewed the patient's chart and labs.  Questions were answered to the patient's satisfaction.     [name]

## 2017-06-06 NOTE — Op Note (Signed)
Laparoscopic Partial Colectomy (extended right hemicolectomy) with Splenic flexure takedown  Indications: This patient presents for a laparoscopic partial colectomy for synchronous colon tumors in the cecum and the distal transverse colon  Pre-operative Diagnosis: colon cancer x 2, cT3N1-2M0   Post-operative Diagnosis: Same  Surgeon: Stark Klein   Assistants: Neysa Bonito, MD  Anesthesia: General endotracheal anesthesia   ASA Class: 3   Procedure Details  The patient was seen in the Holding Room. The risks, benefits, complications, treatment options, and expected outcomes were discussed with the patient. The possibilities of reaction to medication, perforation of viscus, bleeding, recurrent infection, finding a normal colon, the need for additional procedures, failure to diagnose a condition, and creating a complication requiring transfusion or operation were discussed with the patient. The patient was advised of the risk of ostomy. The patient concurred with the proposed plan, giving informed consent. The patient was taken to the operating room, identified, and the procedure verified as partial colectomy. A Time Out was held and the above information confirmed.   The patient was brought to the operating room and placed supine. After induction of a general anesthetic, a Foley catheter was inserted and the abdomen was prepped and draped in standard fashion. Local anesthetic was infiltrated at the left costal margin. A 5 mm Optiview trocar was placed into the abdomen under direct visualization. Pneumoperitoneum was insufflated to a pressure of 15 mm Hg. The laparoscope was introduced. Two 5 mm ports were placed in the left abdomen, one in the lower left abdomen, one in the midline, and two on the right side.  There was omentum stuck to the patient's mesh in the center of the abdomen. This was taken down sharply.  The transverse colon tumor was adherent to the abdominal wall along with omentum.   A portion of peritoneum was taken down en bloc with the tumor.     Mobilization of the colon was very difficult due to the patient's friable tissue, the posterior location of the tumor, and the weight of the large tumors.  The ascending colon and hepatic flexure were then mobilized with gentle retraction of the colon in a medial direction with mobilization of the peritoneal reflection with cautery and the Enseal. The right colic artery was divided with the EnSeal in several bites.  Mobilization of this area was complete to expose the retroperitoneum.  The duodenum was avoided. The omentum was taken off the middle and proximal transverse colon with the Enseal. The omentum on the distal transverse colon was left in place with the colon tumor.  The splenic flexure was taken down with the enseal as well.  The mesh was marked on the anterior abdominal wall in order to avoid it.    After completing mobilization, an 8 cm incision was made in the midline just above the mesh.  A GelPort was placed to protect the skin, and the colon was delivered through the incision. The terminal ileum and colon was resected with a linear stapling device proximal and distal to the area in question in regard to the specimen. The mesenteric vessels were clamped, cut, and tied with 2-0 vicryl ties. The specimen was submitted to pathology. The cavity was inspected for bleeding.  There was a small branch off the middle colic that was bleeding.  This was suture ligated and was then hemostatic.    An end-to-end anastomosis was performed through the small anterior incision with the linear stapling device. The mucosa was inspected and found to be hemostatic.  Closure was achieved with the transverse stapling device. A 3-0 Vicryl suture was used to reapproximate the angle of the anastomosis. The staple line was oversewn with interrupted 3-0 vicryl sutures.    The abdomen was reinsufflated and the laparoscope reintroduced.  The peritoneal  cavity was inspected for any evidence of bleeding.  There was none.    Hemostasis was confirmed. The bowel anastomosis was returned. The colon protocol was followed, replacing drapes and getting new suction/bovie.    The OnQ tunnelers were advanced through the skin on each side of the incision.  The fascial incision was then closed with a running #1 PDS suture. The soft tissue was irrigated and the incisions were closed with staples.  The 5 inch catheters were advanced through the tunnelers. The extraction port was then dressed with a honeycomb dressing and the port sites were dressed with gauze and tegaderm.    Instrument, sponge, and needle counts were correct prior to abdominal closure and at the conclusion of the case.   Findings:  very large mass at the IC valve and large tumor at distal transverse colon adherent to abdominal wall.  Abdominal wall mesh in place.  No evidence of recurrent hernia.    Estimated Blood Loss: Minimal   Specimens: ascending and transverse colon with TI.    Complications: None; patient tolerated the procedure well.   Disposition: PACU - hemodynamically stable.   Condition: stable

## 2017-06-06 NOTE — Transfer of Care (Signed)
Immediate Anesthesia Transfer of Care Note  Patient: [name]  Procedure(s) Performed: Procedure(s): LAPAROSCOPIC PARTIAL COLECTOMY (N/A)  Patient Location: PACU  Anesthesia Type:General  Level of Consciousness: awake, alert  and oriented  Airway & Oxygen Therapy: Patient Spontanous Breathing and Patient connected to face mask oxygen  Post-op Assessment: Report given to RN, Post -op Vital signs reviewed and stable and Patient moving all extremities X 4  Post vital signs: Reviewed and stable  Last Vitals:  Vitals:   06/06/17 0631  BP: (!) 145/69  Pulse: 77  Resp: 20  Temp: 36.7 C    Last Pain:  Vitals:   06/06/17 0631  TempSrc: Oral  PainSc:       Patients Stated Pain Goal: 2 (22/48/25 0037)  Complications: No apparent anesthesia complications

## 2017-06-06 NOTE — Progress Notes (Signed)
Patient arrived to 6n31 from PACU, alert but drowsy, pain mild, IV fluids running as well as morphine PCA and onQ ball noted to abdomen. Abdomen assessed with 7 lap sites one gauze with drainage (marked) and others gauze and tape, 2 sites with onQ ball, and one midline honeycomb, foley cath intact, on 1L o2. Patient's wife at bedside, oriented to room and staff, will continue to monitor.

## 2017-06-07 ENCOUNTER — Encounter (HOSPITAL_COMMUNITY): Payer: Self-pay | Admitting: General Surgery

## 2017-06-07 LAB — PHOSPHORUS: PHOSPHORUS: 2.8 mg/dL (ref 2.5–4.6)

## 2017-06-07 LAB — PROTIME-INR
INR: 1.87
PROTHROMBIN TIME: 21.8 s — AB (ref 11.4–15.2)

## 2017-06-07 LAB — CBC
HEMATOCRIT: 26.8 % — AB (ref 39.0–52.0)
HEMOGLOBIN: 8.3 g/dL — AB (ref 13.0–17.0)
MCH: 24.4 pg — ABNORMAL LOW (ref 26.0–34.0)
MCHC: 31 g/dL (ref 30.0–36.0)
MCV: 78.8 fL (ref 78.0–100.0)
Platelets: 311 10*3/uL (ref 150–400)
RBC: 3.4 MIL/uL — ABNORMAL LOW (ref 4.22–5.81)
RDW: 16.4 % — AB (ref 11.5–15.5)
WBC: 12.3 10*3/uL — AB (ref 4.0–10.5)

## 2017-06-07 LAB — BASIC METABOLIC PANEL
ANION GAP: 4 — AB (ref 5–15)
BUN: 9 mg/dL (ref 6–20)
CHLORIDE: 106 mmol/L (ref 101–111)
CO2: 22 mmol/L (ref 22–32)
Calcium: 8.2 mg/dL — ABNORMAL LOW (ref 8.9–10.3)
Creatinine, Ser: 0.95 mg/dL (ref 0.61–1.24)
GFR calc non Af Amer: >60 mL/min (ref >60)
GLUCOSE: 279 mg/dL — AB (ref 65–99)
Potassium: 5 mmol/L (ref 3.5–5.1)
Sodium: 132 mmol/L — ABNORMAL LOW (ref 135–145)

## 2017-06-07 LAB — MAGNESIUM: Magnesium: 1.8 mg/dL (ref 1.7–2.4)

## 2017-06-07 LAB — CEA: CEA1: 1.8 ng/mL (ref 0.0–4.7)

## 2017-06-07 LAB — HEMOGLOBIN A1C
HEMOGLOBIN A1C: 6.3 % — AB (ref 4.8–5.6)
Mean Plasma Glucose: 134 mg/dL

## 2017-06-07 MED ORDER — DEXTROSE-NACL 5-0.45 % IV SOLN
INTRAVENOUS | Status: DC
  Administered 2017-06-07 – 2017-06-09 (×5): via INTRAVENOUS

## 2017-06-07 NOTE — Consult Note (Signed)
[facility] CM Primary Care Navigator  06/07/2017  [name] 1946-09-28 [id number]   Met with patient and significant other [name]) at the bedside to identify possibledischarge needs.  Patient reports having scheduled for laparoscopic partial colectomy secondary to colon cancer that led to this admission/ surgery. Patient [name].   Patient reportsusing [facility] at [employer] and [employer] delivery serviceto obtain medications without any problem.  Hemanageshismedications at home straight out of the containers.  Patient was driving prior to admission/ surgery. Patient's significant other [name]- who lives with him) will be providingtransportation to hisdoctors' appointments after discharge.  [employer] transportationbenefit discussed with patient and he was grateful for information provided.   Patient's significant other will be his primary caregiver at home as stated.  Anticipated discharge plan ishome according to patient.  Patient and significant other voiced understanding to call primary care provider's office when he returnshome, for a post discharge follow-up appointment within a week or sooner if needs arise.Patient letter (with PCP's contact number) wasprovided as areminder.  Explained to [facility] CM services available for further healthmanagement but he denies any further concerns or needs at this time. However, heopted and verbally agreed to [place] to followup with his recovery at home.  Referral to [place] calls made.  [facility] care management information provided for any future needs that he may have.   For questions, please contact:  Dannielle Huh, BSN, RN- [facility] Primary Care Navigator  Telephone: [phone] [name]

## 2017-06-07 NOTE — Progress Notes (Signed)
Patient not using PCA pump, contacted MD Byerly to remove PCA she said that was fine, will continue to monitor.

## 2017-06-07 NOTE — Progress Notes (Signed)
Wasted 16 ml of remaining Morphine from discontinued PCA pump, wasted in sink with Page Spiro, RN.   Notified patient he should now call for pain medicine if he should need it.

## 2017-06-07 NOTE — Evaluation (Signed)
Physical Therapy Evaluation Patient Details Name: [name] MRN: [id number] DOB: [birth year] Today's Date: 06/07/2017   History of Present Illness  71 y.o. male s/p partial colectomy 2* colon cancer. PMH afib, HTN, anxiety.   Clinical Impression  Pt admitted with above diagnosis. Pt currently with functional limitations due to the deficits listed below (see PT Problem List). Pt ambulated 48' with RW and min/guard assist. Distance limited by pain/fatigue. Good progress expected.  Pt will benefit from skilled PT to increase their independence and safety with mobility to allow discharge to the venue listed below.       Follow Up Recommendations No PT follow up    Equipment Recommendations  Rolling walker with 5" wheels    Recommendations for Other Services       Precautions / Restrictions Precautions Precautions: Other (comment) Precaution Comments: abdominal incision Restrictions Weight Bearing Restrictions: No      Mobility  Bed Mobility               General bed mobility comments: up in recliner  Transfers Overall transfer level: Needs assistance Equipment used: Rolling walker (2 wheeled) Transfers: Sit to/from Stand Sit to Stand: Min assist         General transfer comment: VCs for hand placement  Ambulation/Gait Ambulation/Gait assistance: Min guard Ambulation Distance (Feet): 75 Feet Assistive device: Rolling walker (2 wheeled) Gait Pattern/deviations: Step-through pattern;Decreased step length - left;Decreased stance time - right   Gait velocity interpretation: Below normal speed for age/gender General Gait Details: distance limited by pain/fatigue, steady with RW  Stairs            Wheelchair Mobility    Modified Rankin (Stroke Patients Only)       Balance Overall balance assessment: Modified Independent                                           Pertinent Vitals/Pain Pain Assessment: 0-10 Pain Score: 6  Pain  Location: abdominal incision Pain Descriptors / Indicators: Sore Pain Intervention(s): Limited activity within patient's tolerance;Monitored during session;Premedicated before session (pt has onQue)    Home Living Family/patient expects to be discharged to:: Private residence Living Arrangements: Spouse/significant other Available Help at Discharge: Available 24 hours/day;Family Type of Home: House Home Access: Stairs to enter Entrance Stairs-Rails: Psychiatric nurse of Steps: 3 Home Layout: One level Home Equipment: None      Prior Function Level of Independence: Independent               Hand Dominance        Extremity/Trunk Assessment   Upper Extremity Assessment Upper Extremity Assessment: Overall WFL for tasks assessed    Lower Extremity Assessment Lower Extremity Assessment: Overall WFL for tasks assessed    Cervical / Trunk Assessment Cervical / Trunk Assessment: Normal  Communication   Communication: No difficulties  Cognition Arousal/Alertness: Awake/alert Behavior During Therapy: WFL for tasks assessed/performed Overall Cognitive Status: Within Functional Limits for tasks assessed                                        General Comments      Exercises     Assessment/Plan    PT Assessment Patient needs continued PT services  PT Problem List Pain;Decreased mobility;Decreased activity tolerance  PT Treatment Interventions DME instruction;Gait training;Stair training;Therapeutic exercise;Therapeutic activities;Patient/family education    PT Goals (Current goals can be found in the Care Plan section)  Acute Rehab PT Goals Patient Stated Goal: wants to travel to [place] PT Goal Formulation: With patient Time For Goal Achievement: 06/21/17 Potential to Achieve Goals: Good    Frequency Min 3X/week   Barriers to discharge        Co-evaluation               AM-PAC PT "6 Clicks" Daily Activity   Outcome Measure Difficulty turning over in bed (including adjusting bedclothes, sheets and blankets)?: A Lot Difficulty moving from lying on back to sitting on the side of the bed? : A Lot Difficulty sitting down on and standing up from a chair with arms (e.g., wheelchair, bedside commode, etc,.)?: Total Help needed moving to and from a bed to chair (including a wheelchair)?: A Little Help needed walking in hospital room?: A Little Help needed climbing 3-5 steps with a railing? : A Lot 6 Click Score: 13    End of Session Equipment Utilized During Treatment: Gait belt Activity Tolerance: Patient tolerated treatment well Patient left: in chair;with call bell/phone within reach Nurse Communication: Mobility status PT Visit Diagnosis: Pain;Difficulty in walking, not elsewhere classified (R26.2) Pain - part of body:  (abdomen)    Time: 2878-6767 PT Time Calculation (min) (ACUTE ONLY): 27 min   Charges:   PT Evaluation $PT Eval Low Complexity: 1 Low PT Treatments $Gait Training: 8-22 mins   PT G Codes:          Philomena Doheny 06/07/2017, 10:58 AM [phone]

## 2017-06-07 NOTE — Progress Notes (Signed)
Inpatient Diabetes Program Recommendations  AACE/ADA: New Consensus Statement on Inpatient Glycemic Control (2015)  Target Ranges:  Prepandial:   less than 140 mg/dL      Peak postprandial:   less than 180 mg/dL (1-2 hours)      Critically ill patients:  140 - 180 mg/dL   Lab Results  Component Value Date   HGBA1C 6.3 (H) 06/06/2017    Review of Glycemic ControlResults for SEBERT, STOLLINGS (MRN [id number]) as of 06/07/2017 10:21  Ref. Range 06/06/2017 06:24 06/07/2017 04:08  Glucose Latest Ref Range: 65 - 99 mg/dL 106 (H) 279 (H)    Diabetes history: None Outpatient Diabetes medications: None Current orders for Inpatient glycemic control: None  Inpatient Diabetes Program Recommendations:   Note that lab glucose elevated this morning.  Patient did get Decadron on 06/06/17 x 1.  May consider checking CBG's tid with meals and HS and if >150 mg/dL, consider adding Novolog correction moderate.   Thanks, Adah Perl, RN, BC-ADM Inpatient Diabetes Coordinator Pager [phone] (8a-5p)

## 2017-06-07 NOTE — Progress Notes (Signed)
1 Day Post-Op   Subjective/Chief Complaint: No pain.  No n/v.      Objective: Vital signs in last 24 hours: Temp:  [97.5 F (36.4 C)-97.8 F (36.6 C)] 97.5 F (36.4 C) (08/03 0552) Pulse Rate:  [71-90] 73 (08/03 0552) Resp:  [15-22] 15 (08/03 0800) BP: (111-133)/(59-70) 133/65 (08/03 0552) SpO2:  [95 %-98 %] 95 % (08/03 0800) Weight:  [136.2 kg (300 lb 4.8 oz)] 136.2 kg (300 lb 4.8 oz) (08/03 0552) Last BM Date: 06/05/17  Intake/Output from previous day: 08/02 0701 - 08/03 0700 In: 5979.4 [P.O.:582; I.V.:4647.4; IV Piggyback:750] Out: 1115 [Urine:715; Blood:400] Intake/Output this shift: No intake/output data recorded.  General appearance: alert, cooperative and no distress Resp: breathing comfortably GI: soft, protuberant.  minimal tenderness  Lab Results:   Recent Labs  06/06/17 0624 06/07/17 0408  WBC 10.3 12.3*  HGB 10.7* 8.3*  HCT 33.8* 26.8*  PLT 325 311   BMET  Recent Labs  06/06/17 0624 06/07/17 0408  NA 133* 132*  K 3.7 5.0  CL 106 106  CO2 21* 22  GLUCOSE 106* 279*  BUN 7 9  CREATININE 0.88 0.95  CALCIUM 8.3* 8.2*   PT/INR  Recent Labs  06/06/17 0624 06/07/17 0408  LABPROT 22.2* 21.8*  INR 1.91 1.87   ABG No results for input(s): PHART, HCO3 in the last 72 hours.  Invalid input(s): PCO2, PO2  Studies/Results: No results found.  Anti-infectives: Anti-infectives    Start     Dose/Rate Route Frequency Ordered Stop   06/06/17 2000  cefoTEtan (CEFOTAN) 2 g in dextrose 5 % 50 mL IVPB  Status:  Discontinued     2 g 100 mL/hr over 30 Minutes Intravenous Every 12 hours 06/06/17 1414 06/06/17 1419   06/06/17 1145  clindamycin (CLEOCIN) 900 mg, gentamicin (GARAMYCIN) 240 mg in sodium chloride 0.9 % 1,000 mL for intraperitoneal lavage  Status:  Discontinued      Intraperitoneal To Surgery 06/06/17 1132 06/06/17 1400   06/06/17 0605  cefoTEtan in Dextrose 5% (CEFOTAN) IVPB 2 g     2 g Intravenous On call to O.R. 06/06/17 6503 06/06/17  0839   06/06/17 0600  clindamycin (CLEOCIN) 900 mg, gentamicin (GARAMYCIN) 240 mg in sodium chloride 0.9 % 1,000 mL for intraperitoneal lavage  Status:  Discontinued      Intraperitoneal To ShortStay Surgical 06/05/17 0815 06/06/17 1132      Assessment/Plan: s/p Procedure(s): LAPAROSCOPIC PARTIAL COLECTOMY (N/A) d/c foley in AM.  Continue rate control meds. Clears.   LOS: 1 day    [facility] 06/07/2017

## 2017-06-08 ENCOUNTER — Encounter (HOSPITAL_COMMUNITY): Payer: Self-pay | Admitting: *Deleted

## 2017-06-08 LAB — CBC
HCT: 24.5 % — ABNORMAL LOW (ref 39.0–52.0)
Hemoglobin: 7.6 g/dL — ABNORMAL LOW (ref 13.0–17.0)
MCH: 24.4 pg — AB (ref 26.0–34.0)
MCHC: 31 g/dL (ref 30.0–36.0)
MCV: 78.8 fL (ref 78.0–100.0)
PLATELETS: 331 10*3/uL (ref 150–400)
RBC: 3.11 MIL/uL — ABNORMAL LOW (ref 4.22–5.81)
RDW: 16.7 % — AB (ref 11.5–15.5)
WBC: 17.7 10*3/uL — ABNORMAL HIGH (ref 4.0–10.5)

## 2017-06-08 LAB — BASIC METABOLIC PANEL
Anion gap: 4 — ABNORMAL LOW (ref 5–15)
BUN: 12 mg/dL (ref 6–20)
CHLORIDE: 108 mmol/L (ref 101–111)
CO2: 25 mmol/L (ref 22–32)
CREATININE: 1.12 mg/dL (ref 0.61–1.24)
Calcium: 8.3 mg/dL — ABNORMAL LOW (ref 8.9–10.3)
GFR calc non Af Amer: >60 mL/min (ref >60)
Glucose, Bld: 153 mg/dL — ABNORMAL HIGH (ref 65–99)
Potassium: 4.8 mmol/L (ref 3.5–5.1)
SODIUM: 137 mmol/L (ref 135–145)

## 2017-06-08 NOTE — Progress Notes (Signed)
Patient ID: [name], male   DOB: 1945/11/08, 71 y.o.   MRN: [id number] 2 Days Post-Op   Subjective: States he feels better than yesterday. About 50% less pain. Some occasional nausea without vomiting. Had a small bowel movement. Sipping on clear liquids. Has been out of bed.  Objective: Vital signs in last 24 hours: Temp:  [97.8 F (36.6 C)-98.1 F (36.7 C)] 97.8 F (36.6 C) (08/04 0439) Pulse Rate:  [53-77] 77 (08/04 0439) Resp:  [15-18] 18 (08/04 0439) BP: (105-125)/(50-61) 125/59 (08/04 0439) SpO2:  [95 %-98 %] 98 % (08/04 0439) Last BM Date: 06/05/17  Intake/Output from previous day: 08/03 0701 - 08/04 0700 In: 1421.3 [P.O.:720; I.V.:701.3] Out: 2000 [Urine:2000] Intake/Output this shift: No intake/output data recorded.  General appearance: alert, cooperative and no distress Resp: clear to auscultation bilaterally GI: Obese. Possibly some distention but difficult to determine. Mild to moderate tenderness right side of abdomen without guarding. Bowel sounds hypoactive. Incision/Wound: No erythema or drainage  Lab Results:   Recent Labs  06/07/17 0408 06/08/17 0423  WBC 12.3* 17.7*  HGB 8.3* 7.6*  HCT 26.8* 24.5*  PLT 311 331   BMET  Recent Labs  06/07/17 0408 06/08/17 0423  NA 132* 137  K 5.0 4.8  CL 106 108  CO2 22 25  GLUCOSE 279* 153*  BUN 9 12  CREATININE 0.95 1.12  CALCIUM 8.2* 8.3*     Studies/Results: No results found.  Anti-infectives: Anti-infectives    Start     Dose/Rate Route Frequency Ordered Stop   06/06/17 2000  cefoTEtan (CEFOTAN) 2 g in dextrose 5 % 50 mL IVPB  Status:  Discontinued     2 g 100 mL/hr over 30 Minutes Intravenous Every 12 hours 06/06/17 1414 06/06/17 1419   06/06/17 1145  clindamycin (CLEOCIN) 900 mg, gentamicin (GARAMYCIN) 240 mg in sodium chloride 0.9 % 1,000 mL for intraperitoneal lavage  Status:  Discontinued      Intraperitoneal To Surgery 06/06/17 1132 06/06/17 1400   06/06/17 0605  cefoTEtan in  Dextrose 5% (CEFOTAN) IVPB 2 g     2 g Intravenous On call to O.R. 06/06/17 1324 06/06/17 0839   06/06/17 0600  clindamycin (CLEOCIN) 900 mg, gentamicin (GARAMYCIN) 240 mg in sodium chloride 0.9 % 1,000 mL for intraperitoneal lavage  Status:  Discontinued      Intraperitoneal To [facility] Surgical 06/05/17 0815 06/06/17 1132      Assessment/Plan: s/p Procedure(s): LAPAROSCOPIC PARTIAL COLECTOMY Generally stable postoperatively. Some further drop in hemoglobin, acute blood loss anemia. Follow for now and continue to hold Lovenox. Seems still to have some degree of ileus with nausea and will leave on clear liquids today. Ambulation encouraged.   LOS: 2 days    [name] T 06/08/2017

## 2017-06-09 LAB — BASIC METABOLIC PANEL
Anion gap: 6 (ref 5–15)
BUN: 12 mg/dL (ref 6–20)
CO2: 22 mmol/L (ref 22–32)
CREATININE: 1.04 mg/dL (ref 0.61–1.24)
Calcium: 8.3 mg/dL — ABNORMAL LOW (ref 8.9–10.3)
Chloride: 108 mmol/L (ref 101–111)
GFR calc Af Amer: >60 mL/min (ref >60)
Glucose, Bld: 131 mg/dL — ABNORMAL HIGH (ref 65–99)
Potassium: 4.7 mmol/L (ref 3.5–5.1)
SODIUM: 136 mmol/L (ref 135–145)

## 2017-06-09 LAB — CBC
HCT: 27.8 % — ABNORMAL LOW (ref 39.0–52.0)
Hemoglobin: 8.6 g/dL — ABNORMAL LOW (ref 13.0–17.0)
MCH: 24.5 pg — AB (ref 26.0–34.0)
MCHC: 30.9 g/dL (ref 30.0–36.0)
MCV: 79.2 fL (ref 78.0–100.0)
PLATELETS: 350 10*3/uL (ref 150–400)
RBC: 3.51 MIL/uL — AB (ref 4.22–5.81)
RDW: 16.7 % — ABNORMAL HIGH (ref 11.5–15.5)
WBC: 16.4 10*3/uL — ABNORMAL HIGH (ref 4.0–10.5)

## 2017-06-09 MED ORDER — TRIAMCINOLONE ACETONIDE 0.1 % EX LOTN
TOPICAL_LOTION | Freq: Two times a day (BID) | CUTANEOUS | Status: DC | PRN
  Administered 2017-06-09: 23:00:00 via TOPICAL
  Filled 2017-06-09: qty 60

## 2017-06-09 MED ORDER — BETAMETHASONE VALERATE 0.1 % EX LOTN
TOPICAL_LOTION | Freq: Two times a day (BID) | CUTANEOUS | Status: DC | PRN
  Filled 2017-06-09: qty 60

## 2017-06-09 MED ORDER — ENOXAPARIN SODIUM 40 MG/0.4ML ~~LOC~~ SOLN
40.0000 mg | Freq: Every day | SUBCUTANEOUS | Status: DC
  Administered 2017-06-09 – 2017-06-11 (×3): 40 mg via SUBCUTANEOUS
  Filled 2017-06-09 (×3): qty 0.4

## 2017-06-09 MED ORDER — CLOTRIMAZOLE 1 % EX CREA
TOPICAL_CREAM | Freq: Two times a day (BID) | CUTANEOUS | Status: DC | PRN
  Filled 2017-06-09: qty 15

## 2017-06-09 NOTE — Progress Notes (Signed)
Patient ID: [name], male   DOB: 1946/07/27, 71 y.o.   MRN: [id number] 3 Days Post-Op   Subjective: States he feels a lot better today. Some soreness controlled with Tylenol and no severe pain. No nausea. Has had multiples loose bowel movements which are now slowing down.  Objective: Vital signs in last 24 hours: Temp:  [97.5 F (36.4 C)-98 F (36.7 C)] 98 F (36.7 C) (08/05 0253) Pulse Rate:  [60-78] 60 (08/05 0253) Resp:  [17-18] 17 (08/05 0253) BP: (110-119)/(44-60) 110/44 (08/05 0253) SpO2:  [99 %-100 %] 99 % (08/05 0253) Last BM Date: 06/08/17  Intake/Output from previous day: 08/04 0701 - 08/05 0700 In: 1666.3 [P.O.:600; I.V.:1066.3] Out: -  Intake/Output this shift: No intake/output data recorded.  General appearance: alert, cooperative and no distress GI: Mild tenderness right mid abdomen which is less than yesterday. No apparent distention. Incision/Wound: No erythema or drainage  Lab Results:   Recent Labs  06/08/17 0423 06/09/17 0516  WBC 17.7* 16.4*  HGB 7.6* 8.6*  HCT 24.5* 27.8*  PLT 331 350   BMET  Recent Labs  06/08/17 0423 06/09/17 0516  NA 137 136  K 4.8 4.7  CL 108 108  CO2 25 22  GLUCOSE 153* 131*  BUN 12 12  CREATININE 1.12 1.04  CALCIUM 8.3* 8.3*     Studies/Results: No results found.  Anti-infectives: Anti-infectives    Start     Dose/Rate Route Frequency Ordered Stop   06/06/17 2000  cefoTEtan (CEFOTAN) 2 g in dextrose 5 % 50 mL IVPB  Status:  Discontinued     2 g 100 mL/hr over 30 Minutes Intravenous Every 12 hours 06/06/17 1414 06/06/17 1419   06/06/17 1145  clindamycin (CLEOCIN) 900 mg, gentamicin (GARAMYCIN) 240 mg in sodium chloride 0.9 % 1,000 mL for intraperitoneal lavage  Status:  Discontinued      Intraperitoneal To Surgery 06/06/17 1132 06/06/17 1400   06/06/17 0605  cefoTEtan in Dextrose 5% (CEFOTAN) IVPB 2 g     2 g Intravenous On call to O.R. 06/06/17 4720 06/06/17 0839   06/06/17 0600  clindamycin  (CLEOCIN) 900 mg, gentamicin (GARAMYCIN) 240 mg in sodium chloride 0.9 % 1,000 mL for intraperitoneal lavage  Status:  Discontinued      Intraperitoneal To ShortStay Surgical 06/05/17 0815 06/06/17 1132      Assessment/Plan: s/p Procedure(s): LAPAROSCOPIC PARTIAL COLECTOMY Appears to be improving. Hemoglobin stabilized/improved. Ileus resolving. Leukocytosis but trending down Advance diet Restart Lovenox which was held due to drop in hemoglobin. Check CBC in a.m.   LOS: 3 days    Tamar Miano T 06/09/2017

## 2017-06-09 NOTE — Progress Notes (Signed)
Pt has no c/o pain at all.  Ambulated in the hall up to the nurses desk, is still a little weak in the legs and case management consult put in for Home Health needs: As per PT recommendations and they would like a rolling walker and 3N1 for home.  Tolerating a fair amount of regular diet.

## 2017-06-10 LAB — BASIC METABOLIC PANEL
Anion gap: 6 (ref 5–15)
BUN: 10 mg/dL (ref 6–20)
CALCIUM: 7.9 mg/dL — AB (ref 8.9–10.3)
CO2: 23 mmol/L (ref 22–32)
CREATININE: 0.91 mg/dL (ref 0.61–1.24)
Chloride: 107 mmol/L (ref 101–111)
GFR calc Af Amer: >60 mL/min (ref >60)
GFR calc non Af Amer: >60 mL/min (ref >60)
GLUCOSE: 133 mg/dL — AB (ref 65–99)
Potassium: 4.1 mmol/L (ref 3.5–5.1)
Sodium: 136 mmol/L (ref 135–145)

## 2017-06-10 LAB — CBC
HEMATOCRIT: 25.5 % — AB (ref 39.0–52.0)
Hemoglobin: 7.7 g/dL — ABNORMAL LOW (ref 13.0–17.0)
MCH: 23.8 pg — ABNORMAL LOW (ref 26.0–34.0)
MCHC: 30.2 g/dL (ref 30.0–36.0)
MCV: 78.7 fL (ref 78.0–100.0)
Platelets: 333 10*3/uL (ref 150–400)
RBC: 3.24 MIL/uL — ABNORMAL LOW (ref 4.22–5.81)
RDW: 16.6 % — AB (ref 11.5–15.5)
WBC: 13.5 10*3/uL — AB (ref 4.0–10.5)

## 2017-06-10 MED ORDER — ACETAMINOPHEN 325 MG PO TABS: 650.0000 mg | ORAL_TABLET | ORAL | Status: DC | PRN

## 2017-06-10 MED ORDER — OXYCODONE HCL 5 MG PO TABS: 5.0000 mg | ORAL_TABLET | ORAL | Status: DC | PRN

## 2017-06-10 NOTE — Progress Notes (Signed)
Physical Therapy Treatment Patient Details Name: [name] MRN: [id number] DOB: 31-Oct-1946 Today's Date: 06/10/2017    History of Present Illness 71 y.o. male s/p partial colectomy 2* colon cancer. PMH afib, HTN, anxiety.     PT Comments    Pt limited to therapy due to feeling dizzy and fatigued. Pt stated that his BP has been low. SPTA checked BP in supine (125/59) and EOB sitting (126/50), no abnormal findings. Pt performed all exercises well with max cues for pursed lip breathing. Plan of care remains appropriate. Focus for next tx on increasing ambulation distance to increase cardiovascular endurance.    Follow Up Recommendations  No PT follow up     Equipment Recommendations  Rolling walker with 5" wheels    Recommendations for Other Services       Precautions / Restrictions Precautions Precautions: Other (comment) Precaution Comments: abdominal incision Restrictions Weight Bearing Restrictions: No    Mobility  Bed Mobility Overal bed mobility: Modified Independent             General bed mobility comments: Pt able to supine to sit with HOB elevated using the bed railing with no cueing required.   Transfers                    Ambulation/Gait                 Stairs            Wheelchair Mobility    Modified Rankin (Stroke Patients Only)       Balance Overall balance assessment: Modified Independent                                          Cognition Arousal/Alertness: Awake/alert Behavior During Therapy: WFL for tasks assessed/performed Overall Cognitive Status: Within Functional Limits for tasks assessed                                 General Comments: Pt initially declined therapy, but agreed to do some bed exercises.       Exercises Total Joint Exercises Ankle Circles/Pumps: AROM;Both;Seated;20 reps Straight Leg Raises: AROM;Both;10 reps;Supine Long Arc Quad: AROM;10 reps;Left  (Couldn't tolerate R long arc quad due to abdominal discomfort. ) Other Exercises Other Exercises: x5 Incentive spirometer with max cueing for proper technique.     General Comments        Pertinent Vitals/Pain Pain Assessment: No/denies pain    Home Living                      Prior Function            PT Goals (current goals can now be found in the care plan section) Acute Rehab PT Goals Patient Stated Goal: Wants to go home with HHPT. PT Goal Formulation: With patient/family Potential to Achieve Goals: Good Progress towards PT goals: Progressing toward goals    Frequency    Min 3X/week      PT Plan      Co-evaluation              AM-PAC PT "6 Clicks" Daily Activity  Outcome Measure  Difficulty turning over in bed (including adjusting bedclothes, sheets and blankets)?: A Lot Difficulty moving from lying on back to sitting on the side of  the bed? : A Lot Difficulty sitting down on and standing up from a chair with arms (e.g., wheelchair, bedside commode, etc,.)?: Total Help needed moving to and from a bed to chair (including a wheelchair)?: A Little Help needed walking in hospital room?: A Little Help needed climbing 3-5 steps with a railing? : A Lot 6 Click Score: 13    End of Session Equipment Utilized During Treatment: Gait belt Activity Tolerance: Patient tolerated treatment well Patient left: in bed;with call bell/phone within reach;with family/visitor present Nurse Communication: Mobility status PT Visit Diagnosis: Pain;Difficulty in walking, not elsewhere classified (R26.2)     Time: 8979-1504 PT Time Calculation (min) (ACUTE ONLY): 27 min  Charges:  $Therapeutic Exercise: 23-37 mins                    G CodesRandalyn Rhea, [place] 06/10/2017, 5:31 PM

## 2017-06-10 NOTE — Progress Notes (Signed)
Patient ID: [name], male   DOB: 03/23/1946, 71 y.o.   MRN: [id number] 4 Days Post-Op   Subjective: Having firmer stools.  Passing lots of gas.  No n/v on reg diet.  Still weak and having issues moving around.    Objective: Vital signs in last 24 hours: Temp:  [97.8 F (36.6 C)-98 F (36.7 C)] 97.8 F (36.6 C) (08/06 1408) Pulse Rate:  [67-76] 67 (08/06 1408) Resp:  [17-18] 18 (08/06 0541) BP: (101-121)/(49-59) 101/49 (08/06 1408) SpO2:  [99 %-100 %] 99 % (08/06 1408) Weight:  [135.4 kg (298 lb 8 oz)] 135.4 kg (298 lb 8 oz) (08/06 0541) Last BM Date: 06/09/17  Intake/Output from previous day: 08/05 0701 - 08/06 0700 In: 680 [P.O.:680] Out: -  Intake/Output this shift: Total I/O In: 320 [P.O.:320] Out: -   General appearance: alert, cooperative and no distress GI: Mild tenderness right mid abdomen which is less than yesterday. No apparent distention. Incision/Wound: No erythema or drainage  Lab Results:   Recent Labs  06/09/17 0516 06/10/17 0531  WBC 16.4* 13.5*  HGB 8.6* 7.7*  HCT 27.8* 25.5*  PLT 350 333   BMET  Recent Labs  06/09/17 0516 06/10/17 0531  NA 136 136  K 4.7 4.1  CL 108 107  CO2 22 23  GLUCOSE 131* 133*  BUN 12 10  CREATININE 1.04 0.91  CALCIUM 8.3* 7.9*     Studies/Results: No results found.  Anti-infectives: Anti-infectives    Start     Dose/Rate Route Frequency Ordered Stop   06/06/17 2000  cefoTEtan (CEFOTAN) 2 g in dextrose 5 % 50 mL IVPB  Status:  Discontinued     2 g 100 mL/hr over 30 Minutes Intravenous Every 12 hours 06/06/17 1414 06/06/17 1419   06/06/17 1145  clindamycin (CLEOCIN) 900 mg, gentamicin (GARAMYCIN) 240 mg in sodium chloride 0.9 % 1,000 mL for intraperitoneal lavage  Status:  Discontinued      Intraperitoneal To Surgery 06/06/17 1132 06/06/17 1400   06/06/17 0605  cefoTEtan in Dextrose 5% (CEFOTAN) IVPB 2 g     2 g Intravenous On call to O.R. 06/06/17 [id number] 06/06/17 0839   06/06/17 0600  clindamycin  (CLEOCIN) 900 mg, gentamicin (GARAMYCIN) 240 mg in sodium chloride 0.9 % 1,000 mL for intraperitoneal lavage  Status:  Discontinued      Intraperitoneal To [facility] 06/05/17 0815 06/06/17 1132      Assessment/Plan: s/p Procedure(s): LAPAROSCOPIC PARTIAL COLECTOMY Continuing to improve.  SL IVF Mobilize more today. Home tomorrow if more mobile.  Will need HH PT> Oral pain meds.     LOS: 4 days    [name] 06/10/2017

## 2017-06-10 NOTE — Care Management Note (Signed)
Case Management Note  Patient Details  Name: [name] MRN: [id number] Date of Birth: 04/19/1946  Subjective/Objective:                    Action/Plan:  Consulted for home health needs. No needs identified at present. Will continue to follow. Expected Discharge Date:                  Expected Discharge Plan:  Home/Self Care  In-House Referral:     Discharge planning Services  CM Consult  Post Acute Care Choice:    Choice offered to:     DME Arranged:    DME Agency:     HH Arranged:    HH Agency:     Status of Service:  In process, will continue to follow  If discussed at Long Length of Stay Meetings, dates discussed:    Additional Comments:  Marilu Favre, RN 06/10/2017, 10:45 AM

## 2017-06-10 NOTE — Care Management Important Message (Signed)
Important Message  Patient Details  Name: [name] MRN: [id number] Date of Birth: [birth year]   Medicare Important Message Given:  Yes    Blue Ruggerio 06/10/2017, 12:10 PM

## 2017-06-11 LAB — CBC
HCT: 23.7 % — ABNORMAL LOW (ref 39.0–52.0)
Hemoglobin: 7.4 g/dL — ABNORMAL LOW (ref 13.0–17.0)
MCH: 24.7 pg — ABNORMAL LOW (ref 26.0–34.0)
MCHC: 31.2 g/dL (ref 30.0–36.0)
MCV: 79 fL (ref 78.0–100.0)
PLATELETS: 307 10*3/uL (ref 150–400)
RBC: 3 MIL/uL — AB (ref 4.22–5.81)
RDW: 17.4 % — ABNORMAL HIGH (ref 11.5–15.5)
WBC: 11 10*3/uL — AB (ref 4.0–10.5)

## 2017-06-11 LAB — BASIC METABOLIC PANEL
ANION GAP: 6 (ref 5–15)
BUN: 9 mg/dL (ref 6–20)
CO2: 25 mmol/L (ref 22–32)
Calcium: 8 mg/dL — ABNORMAL LOW (ref 8.9–10.3)
Chloride: 107 mmol/L (ref 101–111)
Creatinine, Ser: 0.93 mg/dL (ref 0.61–1.24)
GFR calc Af Amer: >60 mL/min (ref >60)
Glucose, Bld: 104 mg/dL — ABNORMAL HIGH (ref 65–99)
POTASSIUM: 4.1 mmol/L (ref 3.5–5.1)
SODIUM: 138 mmol/L (ref 135–145)

## 2017-06-11 MED ORDER — TRAMADOL HCL 50 MG PO TABS: 50.0000 mg | ORAL_TABLET | Freq: Four times a day (QID) | ORAL | 0 refills | Status: DC | PRN

## 2017-06-11 NOTE — Progress Notes (Signed)
Patient discharged to home with instructions and prescription, also sent 3 in 1 and a walker from home care.

## 2017-06-11 NOTE — Discharge Instructions (Signed)
[facility]      [facility] °[phone] ° °ABDOMINAL SURGERY: POST OP INSTRUCTIONS ° °Always review your discharge instruction sheet given to you by the facility where your surgery was performed. ° °IF YOU HAVE DISABILITY OR FAMILY LEAVE FORMS, YOU MUST BRING THEM TO THE OFFICE FOR PROCESSING.  PLEASE DO NOT GIVE THEM TO YOUR DOCTOR. ° °1. A prescription for pain medication may be given to you upon discharge.  Take your pain medication as prescribed, if needed.  If narcotic pain medicine is not needed, then you may take acetaminophen (Tylenol) or ibuprofen (Advil) as needed. °2. Take your usually prescribed medications unless otherwise directed. °3. If you need a refill on your pain medication, please contact your pharmacy. They will contact our office to request authorization.  Prescriptions will not be filled after 5pm or on week-ends. °4. You should follow a light diet the first few days after arrival home, such as soup and crackers, pudding, etc.unless your doctor has advised otherwise. A high-fiber, low fat diet can be resumed as tolerated.   Be sure to include lots of fluids daily. Most patients will experience some swelling and bruising on the chest and neck area.  Ice packs will help.  Swelling and bruising can take several days to resolve °5. Most patients will experience some swelling and bruising in the area of the incision. Ice pack will help. Swelling and bruising can take several days to resolve..  °6. It is common to experience some constipation if taking pain medication after surgery.  Increasing fluid intake and taking a stool softener will usually help or prevent this problem from occurring.  A mild laxative (Milk of Magnesia or Miralax) should be taken according to package directions if there are no bowel movements after 48 hours. °7.  You may have steri-strips (small skin tapes) in place directly over the incision.  These strips should be left on the skin for 10-14 days.  If your  surgeon used skin glue on the incision, you may shower in 48 hours.  The glue will flake off over the next 2-3 weeks.  Any sutures or staples will be removed at the office during your follow-up visit. You may find that a light gauze bandage over your incision may keep your staples from being rubbed or pulled. You may shower and replace the bandage daily. °8. ACTIVITIES:  You may resume regular (light) daily activities beginning the next day--such as daily self-care, walking, climbing stairs--gradually increasing activities as tolerated.  You may have sexual intercourse when it is comfortable.  Refrain from any heavy lifting or straining until approved by your doctor. °a. You may drive when you no longer are taking prescription pain medication, you can comfortably wear a seatbelt, and you can safely maneuver your car and apply brakes °b. Return to Work: __________8 weeks if applicable_________________________ °9. You should see your doctor in the office for a follow-up appointment approximately two weeks after your surgery.  Make sure that you call for this appointment within a day or two after you arrive home to insure a convenient appointment time. °OTHER INSTRUCTIONS:  °_____________________________________________________________ °_____________________________________________________________ ° °WHEN TO CALL YOUR DOCTOR: °1. Fever over 101.0 °2. Inability to urinate °3. Nausea and/or vomiting °4. Extreme swelling or bruising °5. Continued bleeding from incision. °6. Increased pain, redness, or drainage from the incision. °7. Difficulty swallowing or breathing °8. Muscle cramping or spasms. °9. Numbness or tingling in hands or feet or around lips. ° °The clinic staff is   available to answer your questions during regular business hours.  Please don’t hesitate to call and ask to speak to one of the nurses if you have concerns. ° °For further questions, please visit [email or web address] ° ° ° °

## 2017-06-11 NOTE — Discharge Summary (Signed)
Physician Discharge Summary  Patient ID: [name] MRN: [id number] DOB/AGE: Jun 02, 1946 71 y.o.  Admit date: 06/06/2017 Discharge date: 06/19/2017  Admission Diagnoses: Patient Active Problem List   Diagnosis Date Noted  . Colon cancer ([place]) 06/06/2017  . History of ventral hernia repair, 06/23/2013 07/10/2013  . Umbilical hernia 14/78/2956     Discharge Diagnoses:  Active Problems:   Colon cancer [facility])   Discharged Condition: stable  Hospital Course:  Pt was admitted to the floor following a lap assisted extended right hemicolectomy for 2 colon cancers.  He did well overnight with minimal discomfort and no nausea or vomiting on POD 1.  He started having some loose bowel movements which was not unexpected given the partial obstruction he had from his tumors and the amount of colon removed.  He had a drop in his hemoglobin and lovenox was held for 1-2 days.  His stools became firmer as his diet was advanced.  He did not require narcotics at that point and was only using tramadol for pain.  He was mobile with a walker.  He was able to void after foley removal.  He had no cardiac issues and was maintained on his rate control agents.    Consults: None  Significant Diagnostic Studies: labs: Cr 0.93 (baseline), WBCs down to 11, HCT 23.7 prior to d/c.    Treatments: surgery: see above.  Discharge Exam: Blood pressure 101/68, pulse 77, temperature 98.2 F (36.8 C), temperature source Oral, resp. rate 18, height 6\' 2"  (1.88 m), weight 135.4 kg (298 lb 8 oz), SpO2 99 %. General appearance: alert, cooperative and no distress Resp: breathing comfortably GI: soft, non tender, protuberant.  wounds c/d/i. Extremities: +1-2 edema (baseline)  Disposition: 01-Home or Self Care  Discharge Instructions    Call MD for:  difficulty breathing, headache or visual disturbances    Complete by:  As directed    Call MD for:  persistant nausea and vomiting    Complete by:  As directed    Call MD  for:  redness, tenderness, or signs of infection (pain, swelling, redness, odor or green/yellow discharge around incision site)    Complete by:  As directed    Call MD for:  severe uncontrolled pain    Complete by:  As directed    Call MD for:  temperature >100.4    Complete by:  As directed    Diet - low sodium heart healthy    Complete by:  As directed    Increase activity slowly    Complete by:  As directed      Allergies as of 06/11/2017   No Known Allergies     Medication List    TAKE these medications   ALPRAZolam 0.5 MG tablet Commonly known as:  XANAX Take 0.5 mg by mouth 3 (three) times daily as needed for anxiety.   atorvastatin 80 MG tablet Commonly known as:  LIPITOR Take 80 mg by mouth every evening.   diltiazem 300 MG 24 hr capsule Commonly known as:  CARDIZEM CD Take 300 mg by mouth every morning.   lisinopril 10 MG tablet Commonly known as:  PRINIVIL,ZESTRIL Take 10 mg by mouth every morning.   metoprolol tartrate 50 MG tablet Commonly known as:  LOPRESSOR Take 25 mg by mouth 2 (two) times daily.   traMADol 50 MG tablet Commonly known as:  ULTRAM Take 1-2 tablets (50-100 mg total) by mouth every 6 (six) hours as needed for moderate pain or severe pain.  warfarin 5 MG tablet Commonly known as:  COUMADIN Take 2.5-5 mg by mouth daily. Takes 1/2 tablet daily except for Thursday and Sunday he takes 1 tablet      Follow-up Information    Stark Klein, MD Follow up in 2 week(s).   Specialty:  General Surgery Contact information: [address] [place] [address] [phone]        [facility] [place] Follow up in 1 week(s).   Specialty:  General Surgery Why:  staple removal Contact information: [place] [address] [phone]           Signed: Stark Klein 06/19/2017, 12:28 PM

## 2017-06-11 NOTE — Care Management Note (Signed)
Case Management Note  Patient Details  Name: FREDIS MALKIEWICZ MRN: [id number] Date of Birth: 1946/08/17  Subjective/Objective:                    Action/Plan:   Expected Discharge Date:  06/11/17               Expected Discharge Plan:  Home/Self Care  In-House Referral:     Discharge planning Services  CM Consult  Post Acute Care Choice:  Durable Medical Equipment Choice offered to:     DME Arranged:  3-N-1, Walker rolling DME Agency:  [place]:    [facility] Agency:     Status of Service:  Completed, signed off  If discussed at [place] of Stay Meetings, dates discussed:    Additional Comments:  Marilu Favre, RN 06/11/2017, 11:10 AM

## 2017-06-17 ENCOUNTER — Other Ambulatory Visit: Payer: Self-pay | Admitting: *Deleted

## 2017-06-17 NOTE — Patient Outreach (Signed)
[facility]) Care Management  06/17/2017  [name] 1946-04-20 [id number]  EMMI- General Discharge RED ON EMMI ALERT DAY#: 1 DATE: 06/14/17 RED ALERT: Other questions  Outreach attempt # 1, spoke with patient. Reviewed and addressed red alert. Patient reported, he had extensive colon surgery. One of the seven surgical incisions was draining a minimum amount, per patient. He stated, he contacted the physician regarding the drainage. He was given directions or how to care for the incision by the MD. Patient stated, the incision is healing, without any drainage. He plans to have the sutures removed next week. Patient stated, his biopsy report showed "slight cancer cells". His Oncologist will decide if patient needs chemotherapy versus nothing. Patient's response to his current medical conditions were positive. He believes, his life has been rewarding, with no regrets.   Patient reported, he has an upcoming appointment with the Oncologist, Cardiologist, and his Ophthalmologist.   Plan: RN CM will notify [facility] CM administrative assistant regarding case closure.   Frederick Bells, RN, BSN, MHA/MSL, [place] Telephonic Care Manager Coordinator [facility] Direct Phone: [phone] Toll Free: [phone] Fax: [phone]

## 2017-06-18 ENCOUNTER — Other Ambulatory Visit: Payer: Self-pay | Admitting: Emergency Medicine

## 2017-06-18 DIAGNOSIS — R269 Unspecified abnormalities of gait and mobility: Secondary | ICD-10-CM | POA: Diagnosis not present

## 2017-06-19 ENCOUNTER — Telehealth: Payer: Self-pay

## 2017-06-19 ENCOUNTER — Other Ambulatory Visit: Payer: Medicare HMO

## 2017-06-19 ENCOUNTER — Ambulatory Visit (HOSPITAL_BASED_OUTPATIENT_CLINIC_OR_DEPARTMENT_OTHER): Payer: Medicare HMO | Admitting: Oncology

## 2017-06-19 ENCOUNTER — Encounter (INDEPENDENT_AMBULATORY_CARE_PROVIDER_SITE_OTHER): Payer: Self-pay

## 2017-06-19 VITALS — BP 116/51 | HR 85 | Temp 98.7°F | Resp 18 | Ht 74.0 in | Wt 291.5 lb

## 2017-06-19 DIAGNOSIS — I4891 Unspecified atrial fibrillation: Secondary | ICD-10-CM

## 2017-06-19 DIAGNOSIS — C184 Malignant neoplasm of transverse colon: Secondary | ICD-10-CM

## 2017-06-19 DIAGNOSIS — Z7901 Long term (current) use of anticoagulants: Secondary | ICD-10-CM | POA: Diagnosis not present

## 2017-06-19 DIAGNOSIS — C180 Malignant neoplasm of cecum: Secondary | ICD-10-CM | POA: Diagnosis not present

## 2017-06-19 DIAGNOSIS — C188 Malignant neoplasm of overlapping sites of colon: Secondary | ICD-10-CM

## 2017-06-19 DIAGNOSIS — D630 Anemia in neoplastic disease: Secondary | ICD-10-CM

## 2017-06-19 NOTE — Progress Notes (Signed)
START ON PATHWAY REGIMEN - Colorectal     A cycle is every 14 days:     Oxaliplatin      Leucovorin      5-Fluorouracil      5-Fluorouracil   **Always confirm dose/schedule in your pharmacy ordering system**    Patient Characteristics: Colon Adjuvant, Stage III, High Risk (T4 or N2) Current evidence of distant metastases<= No AJCC T Category: Staged < 8th Ed. AJCC N Category: Staged < 8th Ed. AJCC M Category: Staged < 8th Ed. AJCC 8 Stage Grouping: Staged < 8th Ed. Intent of Therapy: Curative Intent, Discussed with Patient 

## 2017-06-19 NOTE — Progress Notes (Signed)
Met with Mr. [name] and wife prior to appointment with Dr. Benay Spice. Contact Information for GI Medical Oncology sheet provided and reviewed with patient and wife. GI  Support Group introduced and encouraged. Mr. And Mrs. Tilghman invited to call me with any concerns or questions.

## 2017-06-19 NOTE — Progress Notes (Signed)
[place] OFFICE PROGRESS NOTE   Diagnosis: Colon cancer  INTERVAL HISTORY:   [name] returns as scheduled. He underwent an extended right hemicolectomy by Dr. Barry Dienes 06/06/2017. The transverse colon tumor was adherent to the abdominal wall. A portion of peritoneum was taken en bloc with tumor. A large mass was noted at the ileocecal valve and a large tumor at the distal transverse colon were noted.  He reports an uneventful operative recovery. He is having bowel movements.  Objective:  Vital signs in last 24 hours:  Blood pressure (!) 116/51, pulse 85, temperature 98.7 F (37.1 C), temperature source Oral, resp. rate 18, height 6' 2"  (1.88 m), weight 291 lb 8 oz (132.2 kg), SpO2 99 %.  Resp: Lungs clear bilaterally Cardio: Irregular GI: Soft, nontender, the midline wound has healed with staples in place, draining sites appear healed without evidence of infection Vascular: Trace ankle edema bilaterally   Lab Results:  Lab Results  Component Value Date   WBC 11.0 (H) 06/11/2017   HGB 7.4 (L) 06/11/2017   HCT 23.7 (L) 06/11/2017   MCV 79.0 06/11/2017   PLT 307 06/11/2017   NEUTROABS 7.1 06/06/2017    CMP     Component Value Date/Time   NA 138 06/11/2017 0428   K 4.1 06/11/2017 0428   CL 107 06/11/2017 0428   CO2 25 06/11/2017 0428   GLUCOSE 104 (H) 06/11/2017 0428   BUN 9 06/11/2017 0428   CREATININE 0.93 06/11/2017 0428   CALCIUM 8.0 (L) 06/11/2017 0428   PROT 5.9 (L) 06/06/2017 0624   ALBUMIN 2.4 (L) 06/06/2017 0624   AST 23 06/06/2017 0624   ALT 22 06/06/2017 0624   ALKPHOS 61 06/06/2017 0624   BILITOT 0.6 06/06/2017 0624   GFRNONAA >60 06/11/2017 0428   GFRAA >60 06/11/2017 0428    Lab Results  Component Value Date   CEA1 1.8 06/06/2017     Medications: I have reviewed the patient's current medications.  Assessment/Plan: 1. Colon cancer-synchronous transverse and ascending colon masses, and multiple polyps noted on colonoscopy  04/25/2017 ? Biopsies of the transverse and descending colon masses revealed adenocarcinoma with loss of MLH1 and PMS2 expression, positive BRAF V600E Mutation  ? Staging CT 04/25/2017 with synchronous masses at the cecum and transverse colon with evidence of locally metastatic lymph node/direct extension of tumor ? Extended right colectomy 06/06/2017, moderately differentiated adenocarcinoma of the cecum (pT3,pNo), G2-G3 adenocarcinoma of the transverse colon (pT4a,pN1a)  2. Multiple polyps noted in the descending, sigmoid, and transverse colon on colonoscopy 04/25/2017-biopsy of a transverse colon polyp revealed low and high-grade dysplasia, other polyps returned as hyperplastic polyps and fragments of a tubular adenoma  3.   History of iron deficiency anemia secondary to #1  4.   Anxiety  5.   Hypertension  6.   Glaucoma  7.   Atrial fibrillation-maintained on Coumadin  8.    Multiple low-density pancreas lesions noted on CT 04/25/2017-likely benign   Disposition:  [name] underwent a right colectomy on 06/06/2017. He was confirmed to have synchronous primary tumors in the cecum and transverse colon. The cecum tumor is a stage II lesion and the transverse colon tumor is stage IIIB.  I discussed the prognosis and adjuvant treatment options with [name] his wife. He has a significant chance of developing recurrent colon cancer over the next several years.  The biopsy from the colonoscopy specimen returned with loss of MLH1 and PMS2 expression and a BRAF mutation. He has  an MSI high tumor that does not appear to represent hereditary non-polyposis colon cancer syndrome.  I will request repeat MSI/IHC and BRAF testing on the resected tumors.  I explained the relative resistance of the MSI high tumors to single agent 5-fluorouracil. We discussed the benefit of adjuvant 5-FU and oxaliplatin-based chemotherapy in patients with resected stage III colon cancer. I recommended  adjuvant FOLFOX chemotherapy. We also discussed observation and single agent capecitabine.  I reviewed the potential toxicities of the FOLFOX regimen including the chance for nausea/vomiting, mucositis diarrhea, alopecia, and hematologic toxicity. We discussed the hyperpigmentation, rash, sun sensitivity, and hand/foot syndrome associated with 5-fluorouracil. We discussed the allergic reaction and various types of neuropathy seen with oxaliplatin. He agrees to proceed. He will attend a chemotherapy teaching class.  [name] will return for a chemotherapy teaching class and baseline CBC/chemistry panel on 06/27/2017. He will be scheduled for a first treatment with FOLFOX on 07/03/2017. He will return for an office visit and cycle 2 chemotherapy on 07/17/2017. The plan is to administer at least 3 months of adjuvant chemotherapy.  We reviewed the potential for interaction with Coumadin while on treatment with 5 fluorouracil. We will monitor the PT/INR while he is being treated with chemotherapy.  I will refer him to Dr. Barry Dienes for placement of a Port-A-Cath.  40 minutes were spent with the patient today. The majority of the time was used for counseling and coordination of care.  Donneta Romberg, MD  06/19/2017  10:33 AM

## 2017-06-19 NOTE — Telephone Encounter (Signed)
appts made and avs printed for patient 

## 2017-06-25 ENCOUNTER — Other Ambulatory Visit: Payer: Self-pay | Admitting: General Surgery

## 2017-06-25 ENCOUNTER — Encounter: Payer: Self-pay | Admitting: *Deleted

## 2017-06-27 ENCOUNTER — Other Ambulatory Visit (HOSPITAL_BASED_OUTPATIENT_CLINIC_OR_DEPARTMENT_OTHER): Payer: Medicare HMO

## 2017-06-27 ENCOUNTER — Encounter (HOSPITAL_COMMUNITY): Payer: Self-pay | Admitting: *Deleted

## 2017-06-27 ENCOUNTER — Encounter: Payer: Self-pay | Admitting: *Deleted

## 2017-06-27 ENCOUNTER — Other Ambulatory Visit: Payer: Medicare HMO

## 2017-06-27 DIAGNOSIS — C188 Malignant neoplasm of overlapping sites of colon: Secondary | ICD-10-CM

## 2017-06-27 DIAGNOSIS — I4891 Unspecified atrial fibrillation: Secondary | ICD-10-CM

## 2017-06-27 DIAGNOSIS — C184 Malignant neoplasm of transverse colon: Secondary | ICD-10-CM | POA: Diagnosis not present

## 2017-06-27 DIAGNOSIS — C180 Malignant neoplasm of cecum: Secondary | ICD-10-CM | POA: Diagnosis not present

## 2017-06-27 LAB — COMPREHENSIVE METABOLIC PANEL
ALBUMIN: 2.7 g/dL — AB (ref 3.5–5.0)
ALK PHOS: 97 U/L (ref 40–150)
ALT: 19 U/L (ref 0–55)
AST: 19 U/L (ref 5–34)
Anion Gap: 6 mEq/L (ref 3–11)
BUN: 8.4 mg/dL (ref 7.0–26.0)
CO2: 25 meq/L (ref 22–29)
Calcium: 8.8 mg/dL (ref 8.4–10.4)
Chloride: 108 mEq/L (ref 98–109)
Creatinine: 0.9 mg/dL (ref 0.7–1.3)
EGFR: 85 mL/min/{1.73_m2} — AB (ref >90)
GLUCOSE: 116 mg/dL (ref 70–140)
POTASSIUM: 4.3 meq/L (ref 3.5–5.1)
SODIUM: 138 meq/L (ref 136–145)
Total Bilirubin: 0.54 mg/dL (ref 0.20–1.20)
Total Protein: 6.1 g/dL — ABNORMAL LOW (ref 6.4–8.3)

## 2017-06-27 LAB — CBC WITH DIFFERENTIAL/PLATELET
BASO%: 1.2 % (ref 0.0–2.0)
BASOS ABS: 0.1 10*3/uL (ref 0.0–0.1)
EOS ABS: 0.5 10*3/uL (ref 0.0–0.5)
EOS%: 8.9 % — ABNORMAL HIGH (ref 0.0–7.0)
HCT: 28.7 % — ABNORMAL LOW (ref 38.4–49.9)
HGB: 8.9 g/dL — ABNORMAL LOW (ref 13.0–17.1)
LYMPH%: 26.4 % (ref 14.0–49.0)
MCH: 24.9 pg — AB (ref 27.2–33.4)
MCHC: 31.1 g/dL — ABNORMAL LOW (ref 32.0–36.0)
MCV: 80 fL (ref 79.3–98.0)
MONO#: 0.7 10*3/uL (ref 0.1–0.9)
MONO%: 11.9 % (ref 0.0–14.0)
NEUT#: 2.8 10*3/uL (ref 1.5–6.5)
NEUT%: 51.6 % (ref 39.0–75.0)
Platelets: 273 10*3/uL (ref 140–400)
RBC: 3.59 10*6/uL — AB (ref 4.20–5.82)
RDW: 19.7 % — ABNORMAL HIGH (ref 11.0–14.6)
WBC: 5.5 10*3/uL (ref 4.0–10.3)
lymph#: 1.4 10*3/uL (ref 0.9–3.3)

## 2017-06-27 NOTE — Progress Notes (Signed)
Pt denies SOB and chest pain. Pt under the care of Dr. Einar Gip, Cardiology. Pt stated that he was instructed to take last dose of Coumadin today. Pt made aware to stop taking  Aspirin, vitamins, fish oil, and herbal medications. Do not take any NSAIDs ie: Ibuprofen, Advil, Naproxen (Aleve), Motrin, BC and Goody Powder or any medication containing Aspirin. Pt verbalized understanding of all pre-op instructions.

## 2017-06-27 NOTE — Progress Notes (Signed)
   06/27/17 1858  OBSTRUCTIVE SLEEP APNEA  Have you ever been diagnosed with sleep apnea through a sleep study? No  Do you snore loudly (loud enough to be heard through closed doors)?  0  Do you often feel tired, fatigued, or sleepy during the daytime (such as falling asleep during driving or talking to someone)? 0  Has anyone observed you stop breathing during your sleep? 0  Do you have, or are you being treated for high blood pressure? 1  BMI more than 35 kg/m2? 1  Age > 50 (1-yes) 1  Neck circumference greater than:Male 16 inches or larger, Male 17inches or larger? 1  Male Gender (Yes=1) 1  Obstructive Sleep Apnea Score 5

## 2017-06-28 ENCOUNTER — Other Ambulatory Visit: Payer: Self-pay | Admitting: *Deleted

## 2017-06-28 MED ORDER — PROCHLORPERAZINE MALEATE 10 MG PO TABS: 10.0000 mg | ORAL_TABLET | Freq: Four times a day (QID) | ORAL | 1 refills | Status: AC | PRN

## 2017-06-28 MED ORDER — LIDOCAINE-PRILOCAINE 2.5-2.5 % EX CREA: TOPICAL_CREAM | CUTANEOUS | 0 refills | Status: AC

## 2017-06-30 ENCOUNTER — Other Ambulatory Visit: Payer: Self-pay | Admitting: Oncology

## 2017-07-01 MED ORDER — DEXTROSE 5 % IV SOLN
3.0000 g | INTRAVENOUS | Status: AC
  Administered 2017-07-02: 3 g via INTRAVENOUS
  Filled 2017-07-01: qty 3

## 2017-07-01 NOTE — H&P (Signed)
TRENTAN TRIPPE 06/25/2017 9:33 AM Location: [facility] Patient #: [id number] DOB: October 21, 1946 Widowed / Language: Cleophus Molt / Race: White Male   History of Present Illness Stark Klein MD; 06/25/2017 10:26 AM) The patient is a 71 year old male who presents for a follow-up for Colorectal cancer. Patient is a 71 year old male who presented with a history of anemia. He was referred by Dr. 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 He did have an MSI absent, but BRAF +, 95% excluding lynch syndrome. He has no family history of colon cancer. He states that in his family people either died at age 67-40 of a heart attack or lived to be 11 and died of old age. He denies any change in his bowel habits preceding the colonoscopy. The patient's has history of atrial fibrillation and is on Coumadin for this. He is followed by Dr. Einar Gip with cardiology. He had CT scaging which was negative for metastatic disease. He does have some cystic lesions in his pancreas. These do not appear to be consistent with malignancy.  He underwent lap assisted extended right hemicolectomy 06/06/17. He did very well post operatively. He had diarrhea initially which was not surprising given the amount of colon removed. This resolved. He is doing well at this time. He is eating, having generally normal bowel movements, denies fever/chills, no n/v. He is not taking pain medication. He had 1/31 LN positive and d/w Dr.Sherrill. Plan for chemotherapy.    pathology 06/06/17 Diagnosis Colon, segmental resection for tumor, Right Ascending and Proximal Transverse - MULTIFOCAL INVASIVE ADENOCARCINOMA. -CECAL TUMOR IS MODERATELY DIFFERENTIATED AND SPANS 10 CM. -TUMOR INVADES INTO PERICOLONIC TISSUE. -FIFTEEN OF FIFTEEN REGIONAL LYMPH NODES NEGATIVE FOR CARCINOMA (0/15). -PROXIMAL TRANSVERSE TUMOR IS  MODERATE TO POORLY DIFFERENTIATED AND SPANS 7 CM. -TUMOR INVADES THROUGH SEROSA AND IS ADHERENT TO OMENTUM AND PERITONEUM. -METASTATIC CARCINOMA IN ONE OF SIXTEEN LYMPH NODES (1/16). - RESECTION MARGINS ARE NEGATIVE.   Allergies (April Staton, CMA; 06/25/2017 9:34 AM) No Known Drug Allergies 05/13/2017  Medication History (April Staton, CMA; 06/25/2017 9:35 AM) ALPRAZolam (0.'5MG'$  Tablet, Oral) Active. Atorvastatin Calcium ('80MG'$  Tablet, Oral) Active. Cartia XT ('300MG'$  Capsule ER 24HR, Oral) Active. Ketorolac Tromethamine (0.4% Solution, Ophthalmic) Active. Lisinopril ('10MG'$  Tablet, Oral) Active. Metoprolol Tartrate ('50MG'$  Tablet, Oral) Active. Nystatin (1000000 UNIT Capsule, 200,000 units Oral daily as directed) Active. Warfarin Sodium ('5MG'$  Tablet, Oral) Active. Medications Reconciled    Review of Systems Stark Klein MD; 06/25/2017 10:26 AM) All other systems negative  Vitals (April Staton [place]; 06/25/2017 9:36 AM) 06/25/2017 9:35 AM Weight: 279.5 lb Height: 74.5in Body Surface Area: 2.52 m Body Mass Index: 35.41 kg/m  Temp.: 97.66F(Oral)  Pulse: 81 (Regular)  BP: 122/50 (Sitting, Left Arm, Standard)       Physical Exam Stark Klein MD; 06/25/2017 10:27 AM) General Mental Status-Alert. General Appearance-Consistent with stated age. Hydration-Well hydrated. Voice-Normal.  Chest and Lung Exam Chest and lung exam reveals -quiet, even and easy respiratory effort with no use of accessory muscles. Inspection Chest Wall - Normal. Back - normal.  Abdomen Note: soft, non distended but protuberant at baseline. staples removed. some irritation at staple site. small amount redness at LLQ incision. no drainage.     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  Doing well from surgical standpoint.  Discussed that LLQ incision may open and drain.  advised to  do warm compresses. CARCINOMA OF ASCENDING COLON (C18.2) Current Plans Follow up with [place] in the office in 3 MONTHS.  Call us sooner as needed.  Pt Education - ccs port insertion education   Signed by Stark Klein, MD (06/25/2017 10:28 AM)

## 2017-07-02 ENCOUNTER — Encounter (HOSPITAL_COMMUNITY)
Admission: RE | Disposition: A | Discharge status: Home / Self Care | Payer: Self-pay | Source: Ambulatory Visit | Attending: General Surgery

## 2017-07-02 ENCOUNTER — Ambulatory Visit (HOSPITAL_COMMUNITY): Payer: Medicare HMO | Admitting: Certified Registered Nurse Anesthetist

## 2017-07-02 ENCOUNTER — Ambulatory Visit (HOSPITAL_COMMUNITY)
Admission: RE | Admit: 2017-07-02 | Discharge: 2017-07-02 | Disposition: A | Discharge status: Home / Self Care | Payer: Medicare HMO | Source: Ambulatory Visit | Attending: General Surgery | Admitting: General Surgery

## 2017-07-02 ENCOUNTER — Ambulatory Visit (HOSPITAL_COMMUNITY): Payer: Medicare HMO

## 2017-07-02 ENCOUNTER — Encounter (HOSPITAL_COMMUNITY): Payer: Self-pay | Admitting: Certified Registered Nurse Anesthetist

## 2017-07-02 ENCOUNTER — Other Ambulatory Visit: Payer: Self-pay

## 2017-07-02 DIAGNOSIS — K429 Umbilical hernia without obstruction or gangrene: Secondary | ICD-10-CM | POA: Diagnosis not present

## 2017-07-02 DIAGNOSIS — I4891 Unspecified atrial fibrillation: Secondary | ICD-10-CM | POA: Diagnosis not present

## 2017-07-02 DIAGNOSIS — D649 Anemia, unspecified: Secondary | ICD-10-CM | POA: Diagnosis not present

## 2017-07-02 DIAGNOSIS — F419 Anxiety disorder, unspecified: Secondary | ICD-10-CM | POA: Insufficient documentation

## 2017-07-02 DIAGNOSIS — C189 Malignant neoplasm of colon, unspecified: Secondary | ICD-10-CM | POA: Diagnosis not present

## 2017-07-02 DIAGNOSIS — Z4682 Encounter for fitting and adjustment of non-vascular catheter: Secondary | ICD-10-CM | POA: Diagnosis not present

## 2017-07-02 DIAGNOSIS — Z79899 Other long term (current) drug therapy: Secondary | ICD-10-CM | POA: Insufficient documentation

## 2017-07-02 DIAGNOSIS — C182 Malignant neoplasm of ascending colon: Secondary | ICD-10-CM | POA: Diagnosis not present

## 2017-07-02 DIAGNOSIS — I10 Essential (primary) hypertension: Secondary | ICD-10-CM | POA: Insufficient documentation

## 2017-07-02 DIAGNOSIS — Z452 Encounter for adjustment and management of vascular access device: Secondary | ICD-10-CM | POA: Diagnosis not present

## 2017-07-02 DIAGNOSIS — Z95828 Presence of other vascular implants and grafts: Secondary | ICD-10-CM

## 2017-07-02 DIAGNOSIS — Z419 Encounter for procedure for purposes other than remedying health state, unspecified: Secondary | ICD-10-CM

## 2017-07-02 DIAGNOSIS — Z7901 Long term (current) use of anticoagulants: Secondary | ICD-10-CM | POA: Insufficient documentation

## 2017-07-02 HISTORY — PX: PORTACATH PLACEMENT: SHX2246

## 2017-07-02 LAB — BASIC METABOLIC PANEL
Anion gap: 7 (ref 5–15)
BUN: 9 mg/dL (ref 6–20)
CALCIUM: 8.3 mg/dL — AB (ref 8.9–10.3)
CHLORIDE: 108 mmol/L (ref 101–111)
CO2: 23 mmol/L (ref 22–32)
CREATININE: 0.88 mg/dL (ref 0.61–1.24)
GFR calc Af Amer: >60 mL/min (ref >60)
GFR calc non Af Amer: >60 mL/min (ref >60)
GLUCOSE: 113 mg/dL — AB (ref 65–99)
Potassium: 4.3 mmol/L (ref 3.5–5.1)
Sodium: 138 mmol/L (ref 135–145)

## 2017-07-02 LAB — CBC
HCT: 30.6 % — ABNORMAL LOW (ref 39.0–52.0)
Hemoglobin: 9.3 g/dL — ABNORMAL LOW (ref 13.0–17.0)
MCH: 24.7 pg — AB (ref 26.0–34.0)
MCHC: 30.4 g/dL (ref 30.0–36.0)
MCV: 81.4 fL (ref 78.0–100.0)
PLATELETS: 295 10*3/uL (ref 150–400)
RBC: 3.76 MIL/uL — ABNORMAL LOW (ref 4.22–5.81)
RDW: 18.2 % — ABNORMAL HIGH (ref 11.5–15.5)
WBC: 6.4 10*3/uL (ref 4.0–10.5)

## 2017-07-02 LAB — PROTIME-INR
INR: 1.36
PROTHROMBIN TIME: 16.6 s — AB (ref 11.4–15.2)

## 2017-07-02 LAB — APTT: aPTT: 31 seconds (ref 24–36)

## 2017-07-02 SURGERY — INSERTION, TUNNELED CENTRAL VENOUS DEVICE, WITH PORT
Anesthesia: General | Site: Chest

## 2017-07-02 MED ORDER — ONDANSETRON HCL 4 MG/2ML IJ SOLN
INTRAMUSCULAR | Status: DC | PRN
  Administered 2017-07-02: 4 mg via INTRAVENOUS

## 2017-07-02 MED ORDER — ACETAMINOPHEN 325 MG PO TABS: 325.0000 mg | ORAL_TABLET | ORAL | Status: DC | PRN

## 2017-07-02 MED ORDER — CHLORHEXIDINE GLUCONATE CLOTH 2 % EX PADS: 6.0000 | MEDICATED_PAD | Freq: Once | CUTANEOUS | Status: DC

## 2017-07-02 MED ORDER — MIDAZOLAM HCL 2 MG/2ML IJ SOLN
INTRAMUSCULAR | Status: AC
  Filled 2017-07-02: qty 2

## 2017-07-02 MED ORDER — LIDOCAINE 2% (20 MG/ML) 5 ML SYRINGE
INTRAMUSCULAR | Status: DC | PRN
  Administered 2017-07-02: 80 mg via INTRAVENOUS

## 2017-07-02 MED ORDER — FENTANYL CITRATE (PF) 100 MCG/2ML IJ SOLN
INTRAMUSCULAR | Status: DC | PRN
  Administered 2017-07-02 (×3): 25 ug via INTRAVENOUS

## 2017-07-02 MED ORDER — FENTANYL CITRATE (PF) 100 MCG/2ML IJ SOLN: 25.0000 ug | INTRAMUSCULAR | Status: DC | PRN

## 2017-07-02 MED ORDER — PROPOFOL 10 MG/ML IV BOLUS
INTRAVENOUS | Status: AC
  Filled 2017-07-02: qty 20

## 2017-07-02 MED ORDER — SODIUM CHLORIDE 0.9 % IV SOLN
INTRAVENOUS | Status: DC | PRN
  Administered 2017-07-02: 500 mL

## 2017-07-02 MED ORDER — FENTANYL CITRATE (PF) 250 MCG/5ML IJ SOLN
INTRAMUSCULAR | Status: AC
  Filled 2017-07-02: qty 5

## 2017-07-02 MED ORDER — MEPERIDINE HCL 25 MG/ML IJ SOLN: 6.2500 mg | INTRAMUSCULAR | Status: DC | PRN

## 2017-07-02 MED ORDER — ACETAMINOPHEN 160 MG/5ML PO SOLN: 325.0000 mg | ORAL | Status: DC | PRN

## 2017-07-02 MED ORDER — LIDOCAINE HCL (PF) 1 % IJ SOLN
INTRAMUSCULAR | Status: AC
  Filled 2017-07-02: qty 30

## 2017-07-02 MED ORDER — EPHEDRINE SULFATE-NACL 50-0.9 MG/10ML-% IV SOSY
PREFILLED_SYRINGE | INTRAVENOUS | Status: DC | PRN
  Administered 2017-07-02 (×2): 10 mg via INTRAVENOUS

## 2017-07-02 MED ORDER — HEPARIN SOD (PORK) LOCK FLUSH 100 UNIT/ML IV SOLN
INTRAVENOUS | Status: DC | PRN
  Administered 2017-07-02: 500 [IU]

## 2017-07-02 MED ORDER — PROPOFOL 10 MG/ML IV BOLUS
INTRAVENOUS | Status: DC | PRN
  Administered 2017-07-02: 140 mg via INTRAVENOUS

## 2017-07-02 MED ORDER — LACTATED RINGERS IV SOLN
INTRAVENOUS | Status: DC
  Administered 2017-07-02: 12:00:00 via INTRAVENOUS

## 2017-07-02 MED ORDER — 0.9 % SODIUM CHLORIDE (POUR BTL) OPTIME
TOPICAL | Status: DC | PRN
  Administered 2017-07-02: 1000 mL

## 2017-07-02 MED ORDER — LIDOCAINE HCL 1 % IJ SOLN
INTRAMUSCULAR | Status: DC | PRN
  Administered 2017-07-02: 20 mL via INTRAMUSCULAR

## 2017-07-02 MED ORDER — HEPARIN SOD (PORK) LOCK FLUSH 100 UNIT/ML IV SOLN
INTRAVENOUS | Status: AC
  Filled 2017-07-02: qty 5

## 2017-07-02 MED ORDER — TRAMADOL HCL 50 MG PO TABS: 50.0000 mg | ORAL_TABLET | Freq: Four times a day (QID) | ORAL | 0 refills | Status: DC | PRN

## 2017-07-02 MED ORDER — DEXAMETHASONE SODIUM PHOSPHATE 10 MG/ML IJ SOLN
INTRAMUSCULAR | Status: DC | PRN
  Administered 2017-07-02: 10 mg via INTRAVENOUS

## 2017-07-02 MED ORDER — BUPIVACAINE-EPINEPHRINE (PF) 0.25% -1:200000 IJ SOLN
INTRAMUSCULAR | Status: AC
  Filled 2017-07-02: qty 30

## 2017-07-02 SURGICAL SUPPLY — 51 items
ADH SKN CLS APL DERMABOND .7 (GAUZE/BANDAGES/DRESSINGS) ×1
BAG DECANTER FOR FLEXI CONT (MISCELLANEOUS) ×3 IMPLANT
BLADE HEX COATED 2.75 (ELECTRODE) ×3 IMPLANT
BLADE SURG 11 STRL SS (BLADE) ×3 IMPLANT
BLADE SURG 15 STRL LF DISP TIS (BLADE) ×1 IMPLANT
BLADE SURG 15 STRL SS (BLADE) ×3
CANISTER SUCT 3000ML PPV (MISCELLANEOUS) IMPLANT
CHLORAPREP W/TINT 10.5 ML (MISCELLANEOUS) ×3 IMPLANT
COVER SURGICAL LIGHT HANDLE (MISCELLANEOUS) ×3 IMPLANT
COVER TRANSDUCER ULTRASND GEL (DRAPE) IMPLANT
CRADLE DONUT ADULT HEAD (MISCELLANEOUS) ×3 IMPLANT
DECANTER SPIKE VIAL GLASS SM (MISCELLANEOUS) ×6 IMPLANT
DERMABOND ADVANCED (GAUZE/BANDAGES/DRESSINGS) ×2
DERMABOND ADVANCED .7 DNX12 (GAUZE/BANDAGES/DRESSINGS) ×1 IMPLANT
DRAPE C-ARM 42X72 X-RAY (DRAPES) ×3 IMPLANT
DRAPE CHEST BREAST 15X10 FENES (DRAPES) ×3 IMPLANT
DRAPE UTILITY XL STRL (DRAPES) ×6 IMPLANT
DRAPE WARM FLUID 44X44 (DRAPE) IMPLANT
DRSG TEGADERM 4X4.75 (GAUZE/BANDAGES/DRESSINGS) ×2 IMPLANT
ELECT REM PT RETURN 9FT ADLT (ELECTROSURGICAL) ×3
ELECTRODE REM PT RTRN 9FT ADLT (ELECTROSURGICAL) ×1 IMPLANT
GAUZE SPONGE 4X4 12PLY STRL (GAUZE/BANDAGES/DRESSINGS) ×2 IMPLANT
GAUZE SPONGE 4X4 16PLY XRAY LF (GAUZE/BANDAGES/DRESSINGS) ×3 IMPLANT
GEL ULTRASOUND 20GR AQUASONIC (MISCELLANEOUS) IMPLANT
GLOVE BIO SURGEON STRL SZ 6 (GLOVE) ×3 IMPLANT
GLOVE BIOGEL PI IND STRL 6.5 (GLOVE) ×1 IMPLANT
GLOVE BIOGEL PI INDICATOR 6.5 (GLOVE) ×2
GOWN STRL REUS W/ TWL LRG LVL3 (GOWN DISPOSABLE) ×1 IMPLANT
GOWN STRL REUS W/TWL 2XL LVL3 (GOWN DISPOSABLE) ×3 IMPLANT
GOWN STRL REUS W/TWL LRG LVL3 (GOWN DISPOSABLE) ×3
KIT BASIN OR (CUSTOM PROCEDURE TRAY) ×3 IMPLANT
KIT PORT POWER 8FR ISP CVUE (Miscellaneous) ×2 IMPLANT
KIT ROOM TURNOVER OR (KITS) ×3 IMPLANT
NDL HYPO 25GX1X1/2 BEV (NEEDLE) ×1 IMPLANT
NEEDLE HYPO 25GX1X1/2 BEV (NEEDLE) ×3 IMPLANT
NS IRRIG 1000ML POUR BTL (IV SOLUTION) ×3 IMPLANT
PACK SURGICAL SETUP 50X90 (CUSTOM PROCEDURE TRAY) ×3 IMPLANT
PAD ARMBOARD 7.5X6 YLW CONV (MISCELLANEOUS) ×3 IMPLANT
PENCIL BUTTON HOLSTER BLD 10FT (ELECTRODE) ×3 IMPLANT
SUT MON AB 4-0 PC3 18 (SUTURE) ×3 IMPLANT
SUT PROLENE 2 0 SH DA (SUTURE) ×6 IMPLANT
SUT VIC AB 3-0 SH 27 (SUTURE) ×3
SUT VIC AB 3-0 SH 27X BRD (SUTURE) ×1 IMPLANT
SYR 20ML ECCENTRIC (SYRINGE) ×6 IMPLANT
SYR 5ML LUER SLIP (SYRINGE) ×3 IMPLANT
SYR CONTROL 10ML LL (SYRINGE) ×3 IMPLANT
TOWEL OR 17X24 6PK STRL BLUE (TOWEL DISPOSABLE) ×3 IMPLANT
TOWEL OR 17X26 10 PK STRL BLUE (TOWEL DISPOSABLE) ×3 IMPLANT
TUBE CONNECTING 12'X1/4 (SUCTIONS)
TUBE CONNECTING 12X1/4 (SUCTIONS) IMPLANT
YANKAUER SUCT BULB TIP NO VENT (SUCTIONS) IMPLANT

## 2017-07-02 NOTE — Op Note (Signed)
PREOPERATIVE DIAGNOSIS:  Colon cancer     POSTOPERATIVE DIAGNOSIS:  Same     PROCEDURE: Left subclavian port placement, Bard ClearVue Power Port, MRI safe, 8-French.      SURGEON:  Stark Klein, MD      ANESTHESIA:  General   FINDINGS:  Good venous return, easy flush, and tip of the catheter and   SVC 25 cm.      SPECIMEN:  None.      ESTIMATED BLOOD LOSS:  Minimal.      COMPLICATIONS:  None known.      PROCEDURE:  Pt was identified in the holding area and taken to   the operating room, where patient was placed supine on the operating room   table.  General anesthesia was induced.  Patient's arms were tucked and the upper   chest and neck were prepped and draped in sterile fashion.  Time-out was   performed according to the surgical safety check list.  When all was   correct, we continued.   Local anesthetic was administered over this   area at the angle of the clavicle.  The vein was accessed with 1 pass of the needle. There was good venous return and the wire passed easily with no ectopy.   Fluoroscopy was used to confirm that the wire was in the vena cava.      The patient was placed back level and the area for the pocket was anethetized   with local anesthetic.  A 3-cm transverse incision was made with a #15   blade.  Cautery was used to divide the subcutaneous tissues down to the   pectoralis muscle.  An [employer] retractor was used to elevate the skin   while a pocket was created on top of the pectoralis fascia.  The port   was placed into the pocket to confirm that it was of adequate size.  The   catheter was preattached to the port.  The port was then secured to the   pectoralis fascia with four 2-0 Prolene sutures.  These were clamped and   not tied down yet.    The catheter was tunneled through to the wire exit   site.  The catheter was placed along the wire to determine what length it should be to be in the SVC.  The catheter was cut at 25 cm.  The tunneler sheath  and dilator were passed over the wire and the dilator and wire were removed.  The catheter was advanced through the tunneler sheath and the tunneler sheath was pulled away.  Care was taken to keep the catheter in the tunneler sheath as this occurred. This was advanced and the tunneler sheath was removed.  There was good venous   return and easy flush of the catheter.  The Prolene sutures were tied   down to the pectoral fascia.  The skin was reapproximated using 3-0   Vicryl interrupted deep dermal sutures.    Fluoroscopy was used to re-confirm good position of the catheter.  The skin   was then closed using 4-0 Monocryl in a subcuticular fashion.  The port was flushed with concentrated heparin flush as well.  The wounds were then cleaned, dried, and dressed with Dermabond.  The patient was awakened from anesthesia and taken to the PACU in stable condition.  Needle, sponge, and instrument counts were correct.               Stark Klein, MD

## 2017-07-02 NOTE — Anesthesia Procedure Notes (Signed)
Procedure Name: LMA Insertion Date/Time: 07/02/2017 2:08 PM Performed by: Myna Bright Pre-anesthesia Checklist: Patient identified, Emergency Drugs available, Suction available and Patient being monitored Patient Re-evaluated:Patient Re-evaluated prior to induction Oxygen Delivery Method: Circle system utilized Preoxygenation: Pre-oxygenation with 100% oxygen Induction Type: IV induction Ventilation: Mask ventilation without difficulty LMA: LMA inserted LMA Size: 4.0 Tube type: Oral Number of attempts: 1 Placement Confirmation: positive ETCO2 and breath sounds checked- equal and bilateral Tube secured with: Tape Dental Injury: Teeth and Oropharynx as per pre-operative assessment

## 2017-07-02 NOTE — Transfer of Care (Signed)
Immediate Anesthesia Transfer of Care Note  Patient: [name]  Procedure(s) Performed: Procedure(s): INSERTION PORT-A-CATH (N/A)  Patient Location: PACU  Anesthesia Type:General  Level of Consciousness: awake, patient cooperative and responds to stimulation  Airway & Oxygen Therapy: Patient Spontanous Breathing  Post-op Assessment: Report given to RN and Post -op Vital signs reviewed and stable  Post vital signs: Reviewed and stable  Last Vitals:  Vitals:   07/02/17 1130  BP: (!) 132/57  Pulse: 68  Resp: 18  Temp: 36.7 C  SpO2: 99%    Last Pain:  Vitals:   07/02/17 1130  TempSrc: Oral  PainSc:          Complications: No apparent anesthesia complications

## 2017-07-02 NOTE — Anesthesia Preprocedure Evaluation (Signed)
Anesthesia Evaluation  Patient identified by MRN, date of birth, ID band Patient awake    Reviewed: Allergy & Precautions, NPO status   Airway Mallampati: III  TM Distance: <3 FB Neck ROM: full    Dental  (+) Poor Dentition, Dental Advidsory Given   Pulmonary neg pulmonary ROS,    breath sounds clear to auscultation       Cardiovascular hypertension, Pt. on medications and Pt. on home beta blockers + dysrhythmias Atrial Fibrillation  Rhythm:Regular Rate:Normal  Echo 05/22/17 [facility] cardiovascular): 1. LV cavity normal in size. Mild concentric LVH. Normal global wall motion. Diastolic function could not be assessed properly due to A. fib. Calculated EF 66%. 2. LA cavity moderately dilated. 3. RA cavity mild to moderately dilated. 4. RV cavity slightly dilated. Normal RV function. 5. Mild aortic regurgitation. 6. Mild mitral regurgitation. Mild calcification of the mitral valve annulus. 7. Moderate tricuspid regurgitation. Mild pulmonary hypertension with approximate PA systolic pressure 39 mmHg  Nuclear stress test 05/20/17 [facility] cardiovascular): 1. Resting EKG demonstrates atrial fibrillation, PVCs and nonspecific T changes. Stress EKG is nondiagnostic for ischemia as is a pharmacologic stress. Patient did not develop symptoms. 2. Normal myocardial perfusion imaging study with a fixed defect suggesting an area of soft tissue attenuation. Overall LV systolic function normal without regional wall motion and modalities. LVEF calculated to be 77%. This is a low risk study.   Neuro/Psych negative neurological ROS     GI/Hepatic negative GI ROS, Neg liver ROS,   Endo/Other  negative endocrine ROS  Renal/GU negative Renal ROS     Musculoskeletal   Abdominal   Peds  Hematology On coumadin for afib   Anesthesia Other Findings   Reproductive/Obstetrics                             Lab Results   Component Value Date   WBC 6.4 07/02/2017   HGB 9.3 (L) 07/02/2017   HCT 30.6 (L) 07/02/2017   MCV 81.4 07/02/2017   PLT 295 07/02/2017   Lab Results  Component Value Date   CREATININE 0.88 07/02/2017   BUN 9 07/02/2017   NA 138 07/02/2017   K 4.3 07/02/2017   CL 108 07/02/2017   CO2 23 07/02/2017   Lab Results  Component Value Date   INR 1.36 07/02/2017   INR 1.87 06/07/2017   INR 1.91 06/06/2017    Anesthesia Physical  Anesthesia Plan  ASA: III  Anesthesia Plan: General and MAC   Post-op Pain Management:    Induction: Intravenous  PONV Risk Score and Plan: 3 and Ondansetron, Dexamethasone, Midazolam and Treatment may vary due to age or medical condition  Airway Management Planned: Oral ETT and LMA  Additional Equipment:   Intra-op Plan:   Post-operative Plan: Extubation in OR  Informed Consent: I have reviewed the patients History and Physical, chart, labs and discussed the procedure including the risks, benefits and alternatives for the proposed anesthesia with the patient or authorized representative who has indicated his/her understanding and acceptance.   Dental Advisory Given  Plan Discussed with: Anesthesiologist, CRNA and Surgeon  Anesthesia Plan Comments:         Anesthesia Quick Evaluation

## 2017-07-02 NOTE — Discharge Instructions (Addendum)
[place] Office Phone Number [phone]   POST OP INSTRUCTIONS  Always review your discharge instruction sheet given to you by the facility where your surgery was performed.  IF YOU HAVE DISABILITY OR FAMILY LEAVE FORMS, YOU MUST BRING THEM TO THE OFFICE FOR PROCESSING.  DO NOT GIVE THEM TO YOUR DOCTOR.  1. A prescription for pain medication may be given to you upon discharge.  Take your pain medication as prescribed, if needed.  If narcotic pain medicine is not needed, then you may take acetaminophen (Tylenol) or ibuprofen (Advil) as needed. 2. Take your usually prescribed medications unless otherwise directed 3. If you need a refill on your pain medication, please contact your pharmacy.  They will contact our office to request authorization.  Prescriptions will not be filled after 5pm or on week-ends. 4. You should eat very light the first 24 hours after surgery, such as soup, crackers, pudding, etc.  Resume your normal diet the day after surgery 5. It is common to experience some constipation if taking pain medication after surgery.  Increasing fluid intake and taking a stool softener will usually help or prevent this problem from occurring.  A mild laxative (Milk of Magnesia or Miralax) should be taken according to package directions if there are no bowel movements after 48 hours. 6. You may shower in 48 hours.  The surgical glue will flake off in 2-3 weeks.   7. ACTIVITIES:  No strenuous activity or heavy lifting for 1 week.   a. You may drive when you no longer are taking prescription pain medication, you can comfortably wear a seatbelt, and you can safely maneuver your car and apply brakes. b. RETURN TO WORK:  __________use restrictions from colon surgery._______________ [name] should see your doctor in the office for a follow-up appointment approximately three-four weeks after your surgery.    WHEN TO CALL YOUR DOCTOR: 1. Fever over 101.0 2. Nausea and/or  vomiting. 3. Extreme swelling or bruising. 4. Continued bleeding from incision. 5. Increased pain, redness, or drainage from the incision.  The clinic staff is available to answer your questions during regular business hours.  Please dont hesitate to call and ask to speak to one of the nurses for clinical concerns.  If you have a medical emergency, go to the nearest emergency room or call 911.  A surgeon from [facility] is always on call at the hospital.  For further questions, please visit [email or web address]

## 2017-07-02 NOTE — Interval H&P Note (Signed)
History and Physical Interval Note:  07/02/2017 11:26 AM  [name]  has presented today for surgery, with the diagnosis of COLON CANCER  The various methods of treatment have been discussed with the patient and family. After consideration of risks, benefits and other options for treatment, the patient has consented to  Procedure(s): INSERTION PORT-A-CATH (N/A) as a surgical intervention .  The patient's history has been reviewed, patient examined, no change in status, stable for surgery.  I have reviewed the patient's chart and labs.  Questions were answered to the patient's satisfaction.     Jaretssi Kraker

## 2017-07-02 NOTE — Anesthesia Preprocedure Evaluation (Signed)
Anesthesia Evaluation  Patient identified by MRN, date of birth, ID band Patient awake    Reviewed: Allergy & Precautions, NPO status , Patient's Chart, lab work & pertinent test results, reviewed documented beta blocker date and time   History of Anesthesia Complications Negative for: history of anesthetic complications  Airway Mallampati: III  TM Distance: >3 FB Neck ROM: Full    Dental  (+) Teeth Intact   Pulmonary neg pulmonary ROS,    breath sounds clear to auscultation       Cardiovascular hypertension, Pt. on medications + dysrhythmias Atrial Fibrillation  Rhythm:Irregular     Neuro/Psych PSYCHIATRIC DISORDERS Anxiety negative neurological ROS     GI/Hepatic negative GI ROS, Neg liver ROS,   Endo/Other  Morbid obesity  Renal/GU negative Renal ROS     Musculoskeletal   Abdominal   Peds  Hematology  (+) anemia ,   Anesthesia Other Findings   Reproductive/Obstetrics                             Anesthesia Physical Anesthesia Plan  ASA: III  Anesthesia Plan: General   Post-op Pain Management:    Induction: Intravenous  PONV Risk Score and Plan: 2 and Ondansetron and Dexamethasone  Airway Management Planned: LMA  Additional Equipment: None  Intra-op Plan:   Post-operative Plan: Extubation in OR  Informed Consent: I have reviewed the patients History and Physical, chart, labs and discussed the procedure including the risks, benefits and alternatives for the proposed anesthesia with the patient or authorized representative who has indicated his/her understanding and acceptance.   Dental advisory given  Plan Discussed with: CRNA and Surgeon  Anesthesia Plan Comments:         Anesthesia Quick Evaluation

## 2017-07-03 ENCOUNTER — Ambulatory Visit (HOSPITAL_BASED_OUTPATIENT_CLINIC_OR_DEPARTMENT_OTHER): Payer: Medicare HMO

## 2017-07-03 ENCOUNTER — Encounter: Payer: Self-pay | Admitting: *Deleted

## 2017-07-03 ENCOUNTER — Other Ambulatory Visit (HOSPITAL_BASED_OUTPATIENT_CLINIC_OR_DEPARTMENT_OTHER): Payer: Medicare HMO

## 2017-07-03 ENCOUNTER — Encounter (HOSPITAL_COMMUNITY): Payer: Self-pay | Admitting: General Surgery

## 2017-07-03 VITALS — BP 126/58 | HR 64 | Temp 98.0°F | Resp 16

## 2017-07-03 DIAGNOSIS — Z7901 Long term (current) use of anticoagulants: Secondary | ICD-10-CM | POA: Diagnosis not present

## 2017-07-03 DIAGNOSIS — C184 Malignant neoplasm of transverse colon: Secondary | ICD-10-CM

## 2017-07-03 DIAGNOSIS — Z5111 Encounter for antineoplastic chemotherapy: Secondary | ICD-10-CM

## 2017-07-03 DIAGNOSIS — I4891 Unspecified atrial fibrillation: Secondary | ICD-10-CM

## 2017-07-03 DIAGNOSIS — C188 Malignant neoplasm of overlapping sites of colon: Secondary | ICD-10-CM

## 2017-07-03 DIAGNOSIS — C189 Malignant neoplasm of colon, unspecified: Secondary | ICD-10-CM | POA: Diagnosis not present

## 2017-07-03 DIAGNOSIS — C180 Malignant neoplasm of cecum: Secondary | ICD-10-CM | POA: Diagnosis not present

## 2017-07-03 LAB — PROTIME-INR
INR: 1.2 — AB (ref 2.00–3.50)
PROTIME: 14.4 s — AB (ref 10.6–13.4)

## 2017-07-03 MED ORDER — DEXTROSE 5 % IV SOLN
85.0000 mg/m2 | Freq: Once | INTRAVENOUS | Status: AC
  Administered 2017-07-03: 225 mg via INTRAVENOUS
  Filled 2017-07-03: qty 40

## 2017-07-03 MED ORDER — FLUOROURACIL CHEMO INJECTION 5 GM/100ML
2400.0000 mg/m2 | INTRAVENOUS | Status: DC
  Administered 2017-07-03: 6300 mg via INTRAVENOUS
  Filled 2017-07-03: qty 126

## 2017-07-03 MED ORDER — DEXAMETHASONE SODIUM PHOSPHATE 10 MG/ML IJ SOLN
10.0000 mg | Freq: Once | INTRAMUSCULAR | Status: AC
  Administered 2017-07-03: 10 mg via INTRAVENOUS

## 2017-07-03 MED ORDER — DEXAMETHASONE SODIUM PHOSPHATE 10 MG/ML IJ SOLN
INTRAMUSCULAR | Status: AC
  Filled 2017-07-03: qty 1

## 2017-07-03 MED ORDER — FLUOROURACIL CHEMO INJECTION 2.5 GM/50ML
400.0000 mg/m2 | Freq: Once | INTRAVENOUS | Status: AC
  Administered 2017-07-03: 1050 mg via INTRAVENOUS
  Filled 2017-07-03: qty 21

## 2017-07-03 MED ORDER — PALONOSETRON HCL INJECTION 0.25 MG/5ML
0.2500 mg | Freq: Once | INTRAVENOUS | Status: AC
  Administered 2017-07-03: 0.25 mg via INTRAVENOUS

## 2017-07-03 MED ORDER — DEXTROSE 5 % IV SOLN
Freq: Once | INTRAVENOUS | Status: AC
  Administered 2017-07-03: 13:00:00 via INTRAVENOUS

## 2017-07-03 MED ORDER — PALONOSETRON HCL INJECTION 0.25 MG/5ML
INTRAVENOUS | Status: AC
  Filled 2017-07-03: qty 5

## 2017-07-03 MED ORDER — LEUCOVORIN CALCIUM INJECTION 350 MG
400.0000 mg/m2 | Freq: Once | INTRAMUSCULAR | Status: AC
  Administered 2017-07-03: 1052 mg via INTRAVENOUS
  Filled 2017-07-03: qty 52.6

## 2017-07-03 NOTE — Progress Notes (Signed)
  Oncology Nurse Navigator Documentation  Navigator Location: [facility] (07/03/17 1608)   )                    Treatment Initiated Date: 07/03/17 (07/03/17 1608) Patient Visit Type: MedOnc (07/03/17 1608) Treatment Phase: First Chemo Tx (07/03/17 1608) Barriers/Navigation Needs: No Needs (07/03/17 1608)   Interventions: Psycho-social support (07/03/17 1608)  Met with patient and his wife during his first chemo tx. Patient positive and upbeat. He voiced no concerns. I encouraged him to call with any questions or concerns.          Acuity: Level 1 (07/03/17 1608) Acuity Level 1: Minimal follow up required (07/03/17 1608)       Time Spent with Patient: 15 (07/03/17 1608)

## 2017-07-03 NOTE — Anesthesia Postprocedure Evaluation (Signed)
Anesthesia Post Note  Patient: [name]  Procedure(s) Performed: Procedure(s) (LRB): INSERTION PORT-A-CATH (N/A)     Patient location during evaluation: PACU Anesthesia Type: General Level of consciousness: awake and alert Pain management: pain level controlled Vital Signs Assessment: post-procedure vital signs reviewed and stable Respiratory status: spontaneous breathing, nonlabored ventilation, respiratory function stable and patient connected to nasal cannula oxygen Cardiovascular status: blood pressure returned to baseline and stable Postop Assessment: no signs of nausea or vomiting Anesthetic complications: no    Last Vitals:  Vitals:   07/02/17 1548 07/02/17 1557  BP: 119/69 137/70  Pulse: 68 68  Resp: 14 16  Temp:    SpO2: 100% 100%    Last Pain:  Vitals:   07/02/17 1557  TempSrc:   PainSc: 0-No pain                 [name]

## 2017-07-03 NOTE — Patient Instructions (Signed)
[facility] Discharge Instructions for Patients Receiving Chemotherapy  Today you received the following chemotherapy agents: Oxaliplatin, Leucovorin, 70fu   To help prevent nausea and vomiting after your treatment, we encourage you to take your nausea medication as prescribed. If you develop nausea and vomiting that is not controlled by your nausea medication, call the clinic.   BELOW ARE SYMPTOMS THAT SHOULD BE REPORTED IMMEDIATELY:  *FEVER GREATER THAN 100.5 F  *CHILLS WITH OR WITHOUT FEVER  NAUSEA AND VOMITING THAT IS NOT CONTROLLED WITH YOUR NAUSEA MEDICATION  *UNUSUAL SHORTNESS OF BREATH  *UNUSUAL BRUISING OR BLEEDING  TENDERNESS IN MOUTH AND THROAT WITH OR WITHOUT PRESENCE OF ULCERS  *URINARY PROBLEMS  *BOWEL PROBLEMS  UNUSUAL RASH Items with * indicate a potential emergency and should be followed up as soon as possible.  Feel free to call the clinic you have any questions or concerns. The clinic phone number is [phone].  Please show the [facility] at check-in to the Emergency Department and triage nurse.  Oxaliplatin Injection What is this medicine? OXALIPLATIN (ox AL i PLA tin) is a chemotherapy drug. It targets fast dividing cells, like cancer cells, and causes these cells to die. This medicine is used to treat cancers of the colon and rectum, and many other cancers. This medicine may be used for other purposes; ask your health care provider or pharmacist if you have questions. COMMON BRAND NAME(S): Eloxatin What should I tell my health care provider before I take this medicine? They need to know if you have any of these conditions: -kidney disease -an unusual or allergic reaction to oxaliplatin, other chemotherapy, other medicines, foods, dyes, or preservatives -pregnant or trying to get pregnant -breast-feeding How should I use this medicine? This drug is given as an infusion into a vein. It is administered in a hospital or clinic by  a specially trained health care professional. Talk to your pediatrician regarding the use of this medicine in children. Special care may be needed. Overdosage: If you think you have taken too much of this medicine contact a poison control center or emergency room at once. NOTE: This medicine is only for you. Do not share this medicine with others. What if I miss a dose? It is important not to miss a dose. Call your doctor or health care professional if you are unable to keep an appointment. What may interact with this medicine? -medicines to increase blood counts like filgrastim, pegfilgrastim, sargramostim -probenecid -some antibiotics like amikacin, gentamicin, neomycin, polymyxin B, streptomycin, tobramycin -zalcitabine Talk to your doctor or health care professional before taking any of these medicines: -acetaminophen -aspirin -ibuprofen -ketoprofen -naproxen This list may not describe all possible interactions. Give your health care provider a list of all the medicines, herbs, non-prescription drugs, or dietary supplements you use. Also tell them if you smoke, drink alcohol, or use illegal drugs. Some items may interact with your medicine. What should I watch for while using this medicine? Your condition will be monitored carefully while you are receiving this medicine. You will need important blood work done while you are taking this medicine. This medicine can make you more sensitive to cold. Do not drink cold drinks or use ice. Cover exposed skin before coming in contact with cold temperatures or cold objects. When out in cold weather wear warm clothing and cover your mouth and nose to warm the air that goes into your lungs. Tell your doctor if you get sensitive to the cold. This drug may  make you feel generally unwell. This is not uncommon, as chemotherapy can affect healthy cells as well as cancer cells. Report any side effects. Continue your course of treatment even though you feel  ill unless your doctor tells you to stop. In some cases, you may be given additional medicines to help with side effects. Follow all directions for their use. Call your doctor or health care professional for advice if you get a fever, chills or sore throat, or other symptoms of a cold or flu. Do not treat yourself. This drug decreases your body's ability to fight infections. Try to avoid being around people who are sick. This medicine may increase your risk to bruise or bleed. Call your doctor or health care professional if you notice any unusual bleeding. Be careful brushing and flossing your teeth or using a toothpick because you may get an infection or bleed more easily. If you have any dental work done, tell your dentist you are receiving this medicine. Avoid taking products that contain aspirin, acetaminophen, ibuprofen, naproxen, or ketoprofen unless instructed by your doctor. These medicines may hide a fever. Do not become pregnant while taking this medicine. Women should inform their doctor if they wish to become pregnant or think they might be pregnant. There is a potential for serious side effects to an unborn child. Talk to your health care professional or pharmacist for more information. Do not breast-feed an infant while taking this medicine. Call your doctor or health care professional if you get diarrhea. Do not treat yourself. What side effects may I notice from receiving this medicine? Side effects that you should report to your doctor or health care professional as soon as possible: -allergic reactions like skin rash, itching or hives, swelling of the face, lips, or tongue -low blood counts - This drug may decrease the number of white blood cells, red blood cells and platelets. You may be at increased risk for infections and bleeding. -signs of infection - fever or chills, cough, sore throat, pain or difficulty passing urine -signs of decreased platelets or bleeding - bruising,  pinpoint red spots on the skin, black, tarry stools, nosebleeds -signs of decreased red blood cells - unusually weak or tired, fainting spells, lightheadedness -breathing problems -chest pain, pressure -cough -diarrhea -jaw tightness -mouth sores -nausea and vomiting -pain, swelling, redness or irritation at the injection site -pain, tingling, numbness in the hands or feet -problems with balance, talking, walking -redness, blistering, peeling or loosening of the skin, including inside the mouth -trouble passing urine or change in the amount of urine Side effects that usually do not require medical attention (report to your doctor or health care professional if they continue or are bothersome): -changes in vision -constipation -hair loss -loss of appetite -metallic taste in the mouth or changes in taste -stomach pain This list may not describe all possible side effects. Call your doctor for medical advice about side effects. You may report side effects to FDA at 1-800-FDA-1088. Where should I keep my medicine? This drug is given in a hospital or clinic and will not be stored at home. NOTE: This sheet is a summary. It may not cover all possible information. If you have questions about this medicine, talk to your doctor, pharmacist, or health care provider.  2018 Elsevier/Gold Standard (2008-05-18 17:22:47) Leucovorin injection What is this medicine? LEUCOVORIN (loo koe VOR in) is used to prevent or treat the harmful effects of some medicines. This medicine is used to treat anemia caused by a  low amount of folic acid in the body. It is also used with 5-fluorouracil (5-FU) to treat colon cancer. This medicine may be used for other purposes; ask your health care provider or pharmacist if you have questions. What should I tell my health care provider before I take this medicine? They need to know if you have any of these conditions: -anemia from low levels of vitamin B-12 in the blood -an  unusual or allergic reaction to leucovorin, folic acid, other medicines, foods, dyes, or preservatives -pregnant or trying to get pregnant -breast-feeding How should I use this medicine? This medicine is for injection into a muscle or into a vein. It is given by a health care professional in a hospital or clinic setting. Talk to your pediatrician regarding the use of this medicine in children. Special care may be needed. Overdosage: If you think you have taken too much of this medicine contact a poison control center or emergency room at once. NOTE: This medicine is only for you. Do not share this medicine with others. What if I miss a dose? This does not apply. What may interact with this medicine? -capecitabine -fluorouracil -phenobarbital -phenytoin -primidone -trimethoprim-sulfamethoxazole This list may not describe all possible interactions. Give your health care provider a list of all the medicines, herbs, non-prescription drugs, or dietary supplements you use. Also tell them if you smoke, drink alcohol, or use illegal drugs. Some items may interact with your medicine. What should I watch for while using this medicine? Your condition will be monitored carefully while you are receiving this medicine. This medicine may increase the side effects of 5-fluorouracil, 5-FU. Tell your doctor or health care professional if you have diarrhea or mouth sores that do not get better or that get worse. What side effects may I notice from receiving this medicine? Side effects that you should report to your doctor or health care professional as soon as possible: -allergic reactions like skin rash, itching or hives, swelling of the face, lips, or tongue -breathing problems -fever, infection -mouth sores -unusual bleeding or bruising -unusually weak or tired Side effects that usually do not require medical attention (report to your doctor or health care professional if they continue or are  bothersome): -constipation or diarrhea -loss of appetite -nausea, vomiting This list may not describe all possible side effects. Call your doctor for medical advice about side effects. You may report side effects to FDA at 1-800-FDA-1088. Where should I keep my medicine? This drug is given in a hospital or clinic and will not be stored at home. NOTE: This sheet is a summary. It may not cover all possible information. If you have questions about this medicine, talk to your doctor, pharmacist, or health care provider.  2018 Elsevier/Gold Standard (2008-04-27 16:50:29) Fluorouracil, 5-FU injection What is this medicine? FLUOROURACIL, 5-FU (flure oh YOOR a sil) is a chemotherapy drug. It slows the growth of cancer cells. This medicine is used to treat many types of cancer like breast cancer, colon or rectal cancer, pancreatic cancer, and stomach cancer. This medicine may be used for other purposes; ask your health care provider or pharmacist if you have questions. COMMON BRAND NAME(S): Adrucil What should I tell my health care provider before I take this medicine? They need to know if you have any of these conditions: -blood disorders -dihydropyrimidine dehydrogenase (DPD) deficiency -infection (especially a virus infection such as chickenpox, cold sores, or herpes) -kidney disease -liver disease -malnourished, poor nutrition -recent or ongoing radiation therapy -an unusual  or allergic reaction to fluorouracil, other chemotherapy, other medicines, foods, dyes, or preservatives -pregnant or trying to get pregnant -breast-feeding How should I use this medicine? This drug is given as an infusion or injection into a vein. It is administered in a hospital or clinic by a specially trained health care professional. Talk to your pediatrician regarding the use of this medicine in children. Special care may be needed. Overdosage: If you think you have taken too much of this medicine contact a poison  control center or emergency room at once. NOTE: This medicine is only for you. Do not share this medicine with others. What if I miss a dose? It is important not to miss your dose. Call your doctor or health care professional if you are unable to keep an appointment. What may interact with this medicine? -allopurinol -cimetidine -dapsone -digoxin -hydroxyurea -leucovorin -levamisole -medicines for seizures like ethotoin, fosphenytoin, phenytoin -medicines to increase blood counts like filgrastim, pegfilgrastim, sargramostim -medicines that treat or prevent blood clots like warfarin, enoxaparin, and dalteparin -methotrexate -metronidazole -pyrimethamine -some other chemotherapy drugs like busulfan, cisplatin, estramustine, vinblastine -trimethoprim -trimetrexate -vaccines Talk to your doctor or health care professional before taking any of these medicines: -acetaminophen -aspirin -ibuprofen -ketoprofen -naproxen This list may not describe all possible interactions. Give your health care provider a list of all the medicines, herbs, non-prescription drugs, or dietary supplements you use. Also tell them if you smoke, drink alcohol, or use illegal drugs. Some items may interact with your medicine. What should I watch for while using this medicine? Visit your doctor for checks on your progress. This drug may make you feel generally unwell. This is not uncommon, as chemotherapy can affect healthy cells as well as cancer cells. Report any side effects. Continue your course of treatment even though you feel ill unless your doctor tells you to stop. In some cases, you may be given additional medicines to help with side effects. Follow all directions for their use. Call your doctor or health care professional for advice if you get a fever, chills or sore throat, or other symptoms of a cold or flu. Do not treat yourself. This drug decreases your body's ability to fight infections. Try to avoid  being around people who are sick. This medicine may increase your risk to bruise or bleed. Call your doctor or health care professional if you notice any unusual bleeding. Be careful brushing and flossing your teeth or using a toothpick because you may get an infection or bleed more easily. If you have any dental work done, tell your dentist you are receiving this medicine. Avoid taking products that contain aspirin, acetaminophen, ibuprofen, naproxen, or ketoprofen unless instructed by your doctor. These medicines may hide a fever. Do not become pregnant while taking this medicine. Women should inform their doctor if they wish to become pregnant or think they might be pregnant. There is a potential for serious side effects to an unborn child. Talk to your health care professional or pharmacist for more information. Do not breast-feed an infant while taking this medicine. Men should inform their doctor if they wish to father a child. This medicine may lower sperm counts. Do not treat diarrhea with over the counter products. Contact your doctor if you have diarrhea that lasts more than 2 days or if it is severe and watery. This medicine can make you more sensitive to the sun. Keep out of the sun. If you cannot avoid being in the sun, wear protective clothing and use  sunscreen. Do not use sun lamps or tanning beds/booths. What side effects may I notice from receiving this medicine? Side effects that you should report to your doctor or health care professional as soon as possible: -allergic reactions like skin rash, itching or hives, swelling of the face, lips, or tongue -low blood counts - this medicine may decrease the number of white blood cells, red blood cells and platelets. You may be at increased risk for infections and bleeding. -signs of infection - fever or chills, cough, sore throat, pain or difficulty passing urine -signs of decreased platelets or bleeding - bruising, pinpoint red spots on the  skin, black, tarry stools, blood in the urine -signs of decreased red blood cells - unusually weak or tired, fainting spells, lightheadedness -breathing problems -changes in vision -chest pain -mouth sores -nausea and vomiting -pain, swelling, redness at site where injected -pain, tingling, numbness in the hands or feet -redness, swelling, or sores on hands or feet -stomach pain -unusual bleeding Side effects that usually do not require medical attention (report to your doctor or health care professional if they continue or are bothersome): -changes in finger or toe nails -diarrhea -dry or itchy skin -hair loss -headache -loss of appetite -sensitivity of eyes to the light -stomach upset -unusually teary eyes This list may not describe all possible side effects. Call your doctor for medical advice about side effects. You may report side effects to FDA at 1-800-FDA-1088. Where should I keep my medicine? This drug is given in a hospital or clinic and will not be stored at home. NOTE: This sheet is a summary. It may not cover all possible information. If you have questions about this medicine, talk to your doctor, pharmacist, or health care provider.  2018 Elsevier/Gold Standard (2008-02-25 13:53:16)

## 2017-07-04 ENCOUNTER — Telehealth: Payer: Self-pay

## 2017-07-04 NOTE — Telephone Encounter (Signed)
-----   Message from Ladell Pier, MD sent at 07/03/2017  8:04 PM EDT ----- Regarding: RE: Coumadin  Resume coumadin same dose, ignore previous message I sent, I did not know he was holding the coumadin, check PT next visit ----- Message ----- From: Veverly Fells, RN Sent: 07/03/2017   2:39 PM To: Ladell Pier, MD Subject: Coumadin                                       Hi Dr. Benay Spice,  This patient is in infusion today. He wants to know if he should resume his coumadin with chemotherapy. He is currently holding it due to his recent port-a-cath placement.

## 2017-07-04 NOTE — Telephone Encounter (Signed)
Call placed to patient to inform him that it is ok to resume taking his coumadin per MD Sherrill at his previous dose. Pt verbalizes understanding and states that his chemo infusion went well yesterday and that he feels "great" this AM. Pt states that he knows when to take his anti-nausea medication and to call [facility] with any concerns. Pt appreciative of F/U.

## 2017-07-05 ENCOUNTER — Encounter: Payer: Self-pay | Admitting: *Deleted

## 2017-07-05 ENCOUNTER — Ambulatory Visit (HOSPITAL_BASED_OUTPATIENT_CLINIC_OR_DEPARTMENT_OTHER): Payer: Medicare HMO

## 2017-07-05 VITALS — BP 130/63 | HR 82 | Temp 97.0°F | Resp 18

## 2017-07-05 DIAGNOSIS — Z452 Encounter for adjustment and management of vascular access device: Secondary | ICD-10-CM

## 2017-07-05 DIAGNOSIS — C188 Malignant neoplasm of overlapping sites of colon: Secondary | ICD-10-CM

## 2017-07-05 DIAGNOSIS — C180 Malignant neoplasm of cecum: Secondary | ICD-10-CM | POA: Diagnosis not present

## 2017-07-05 MED ORDER — SODIUM CHLORIDE 0.9% FLUSH
10.0000 mL | INTRAVENOUS | Status: DC | PRN
  Administered 2017-07-05: 10 mL
  Filled 2017-07-05: qty 10

## 2017-07-05 MED ORDER — HEPARIN SOD (PORK) LOCK FLUSH 100 UNIT/ML IV SOLN
500.0000 [IU] | Freq: Once | INTRAVENOUS | Status: AC | PRN
  Administered 2017-07-05: 500 [IU]
  Filled 2017-07-05: qty 5

## 2017-07-05 NOTE — Patient Instructions (Signed)
Implanted Port Home Guide An implanted port is a type of central line that is placed under the skin. Central lines are used to provide IV access when treatment or nutrition needs to be given through a person's veins. Implanted ports are used for long-term IV access. An implanted port may be placed because:  You need IV medicine that would be irritating to the small veins in your hands or arms.  You need long-term IV medicines, such as antibiotics.  You need IV nutrition for a long period.  You need frequent blood draws for lab tests.  You need dialysis.  Implanted ports are usually placed in the chest area, but they can also be placed in the upper arm, the abdomen, or the leg. An implanted port has two main parts:  Reservoir. The reservoir is round and will appear as a small, raised area under your skin. The reservoir is the part where a needle is inserted to give medicines or draw blood.  Catheter. The catheter is a thin, flexible tube that extends from the reservoir. The catheter is placed into a large vein. Medicine that is inserted into the reservoir goes into the catheter and then into the vein.  How will I care for my incision site? Do not get the incision site wet. Bathe or shower as directed by your health care provider. How is my port accessed? Special steps must be taken to access the port:  Before the port is accessed, a numbing cream can be placed on the skin. This helps numb the skin over the port site.  Your health care provider uses a sterile technique to access the port. ? Your health care provider must put on a mask and sterile gloves. ? The skin over your port is cleaned carefully with an antiseptic and allowed to dry. ? The port is gently pinched between sterile gloves, and a needle is inserted into the port.  Only "non-coring" port needles should be used to access the port. Once the port is accessed, a blood return should be checked. This helps ensure that the port  is in the vein and is not clogged.  If your port needs to remain accessed for a constant infusion, a clear (transparent) bandage will be placed over the needle site. The bandage and needle will need to be changed every week, or as directed by your health care provider.  Keep the bandage covering the needle clean and dry. Do not get it wet. Follow your health care provider's instructions on how to take a shower or bath while the port is accessed.  If your port does not need to stay accessed, no bandage is needed over the port.  What is flushing? Flushing helps keep the port from getting clogged. Follow your health care provider's instructions on how and when to flush the port. Ports are usually flushed with saline solution or a medicine called heparin. The need for flushing will depend on how the port is used.  If the port is used for intermittent medicines or blood draws, the port will need to be flushed: ? After medicines have been given. ? After blood has been drawn. ? As part of routine maintenance.  If a constant infusion is running, the port may not need to be flushed.  How long will my port stay implanted? The port can stay in for as long as your health care provider thinks it is needed. When it is time for the port to come out, surgery will be   done to remove it. The procedure is similar to the one performed when the port was put in. When should I seek immediate medical care? When you have an implanted port, you should seek immediate medical care if:  You notice a bad smell coming from the incision site.  You have swelling, redness, or drainage at the incision site.  You have more swelling or pain at the port site or the surrounding area.  You have a fever that is not controlled with medicine.  This information is not intended to replace advice given to you by your health care provider. Make sure you discuss any questions you have with your health care provider. Document  Released: 10/22/2005 Document Revised: 03/29/2016 Document Reviewed: 06/29/2013 Elsevier Interactive Patient Education  2017 Elsevier Inc.  

## 2017-07-08 NOTE — Progress Notes (Signed)
[facility] Social Work  Clinical Social Work was referred by patient to review and complete healthcare advance directives.  Clinical Social Worker met with patient in [employer].  Patient shared he wanted to update his advance directives.  He had already completed paperwork and had no questions.  The patient designated Harrel Carina as their primary healthcare agent and Navjot Loera as their secondary agent.  Patient also completed healthcare living will.    Clinical Social Worker notarized documents and made copies for patient/family. Clinical Social Worker will send documents to medical records to be scanned into patient's chart. Clinical Social Worker encouraged patient/family to contact with any additional questions or concerns.  Maryjean Morn, MSW, LCSW Clinical Social Worker [facility] [phone]

## 2017-07-14 ENCOUNTER — Other Ambulatory Visit: Payer: Self-pay | Admitting: Oncology

## 2017-07-17 ENCOUNTER — Ambulatory Visit (HOSPITAL_BASED_OUTPATIENT_CLINIC_OR_DEPARTMENT_OTHER): Payer: Medicare HMO

## 2017-07-17 ENCOUNTER — Encounter: Payer: Medicare HMO | Admitting: Nutrition

## 2017-07-17 ENCOUNTER — Ambulatory Visit (HOSPITAL_BASED_OUTPATIENT_CLINIC_OR_DEPARTMENT_OTHER): Payer: Medicare HMO | Admitting: Nurse Practitioner

## 2017-07-17 VITALS — BP 123/49 | HR 84 | Temp 98.5°F | Resp 24 | Ht 74.0 in | Wt 282.0 lb

## 2017-07-17 DIAGNOSIS — C184 Malignant neoplasm of transverse colon: Secondary | ICD-10-CM

## 2017-07-17 DIAGNOSIS — C182 Malignant neoplasm of ascending colon: Secondary | ICD-10-CM

## 2017-07-17 DIAGNOSIS — Z5111 Encounter for antineoplastic chemotherapy: Secondary | ICD-10-CM

## 2017-07-17 DIAGNOSIS — Z7901 Long term (current) use of anticoagulants: Secondary | ICD-10-CM

## 2017-07-17 DIAGNOSIS — I10 Essential (primary) hypertension: Secondary | ICD-10-CM

## 2017-07-17 DIAGNOSIS — I4891 Unspecified atrial fibrillation: Secondary | ICD-10-CM

## 2017-07-17 DIAGNOSIS — Z452 Encounter for adjustment and management of vascular access device: Secondary | ICD-10-CM

## 2017-07-17 DIAGNOSIS — C188 Malignant neoplasm of overlapping sites of colon: Secondary | ICD-10-CM

## 2017-07-17 DIAGNOSIS — Z95828 Presence of other vascular implants and grafts: Secondary | ICD-10-CM

## 2017-07-17 DIAGNOSIS — C189 Malignant neoplasm of colon, unspecified: Secondary | ICD-10-CM

## 2017-07-17 LAB — CBC WITH DIFFERENTIAL/PLATELET
BASO%: 3.5 % — ABNORMAL HIGH (ref 0.0–2.0)
Basophils Absolute: 0.1 10*3/uL (ref 0.0–0.1)
EOS ABS: 0.2 10*3/uL (ref 0.0–0.5)
EOS%: 6.9 % (ref 0.0–7.0)
HCT: 29.2 % — ABNORMAL LOW (ref 38.4–49.9)
HEMOGLOBIN: 9.3 g/dL — AB (ref 13.0–17.1)
LYMPH%: 29.9 % (ref 14.0–49.0)
MCH: 24.5 pg — ABNORMAL LOW (ref 27.2–33.4)
MCHC: 31.8 g/dL — ABNORMAL LOW (ref 32.0–36.0)
MCV: 77.2 fL — ABNORMAL LOW (ref 79.3–98.0)
MONO#: 0.5 10*3/uL (ref 0.1–0.9)
MONO%: 15.2 % — AB (ref 0.0–14.0)
NEUT%: 44.5 % (ref 39.0–75.0)
NEUTROS ABS: 1.4 10*3/uL — AB (ref 1.5–6.5)
Platelets: 190 10*3/uL (ref 140–400)
RBC: 3.78 10*6/uL — ABNORMAL LOW (ref 4.20–5.82)
RDW: 18.5 % — ABNORMAL HIGH (ref 11.0–14.6)
WBC: 3.1 10*3/uL — ABNORMAL LOW (ref 4.0–10.3)
lymph#: 0.9 10*3/uL (ref 0.9–3.3)

## 2017-07-17 LAB — COMPREHENSIVE METABOLIC PANEL
ALBUMIN: 2.9 g/dL — AB (ref 3.5–5.0)
ALT: 15 U/L (ref 0–55)
AST: 19 U/L (ref 5–34)
Alkaline Phosphatase: 93 U/L (ref 40–150)
Anion Gap: 5 mEq/L (ref 3–11)
BUN: 8.5 mg/dL (ref 7.0–26.0)
CO2: 26 mEq/L (ref 22–29)
CREATININE: 0.8 mg/dL (ref 0.7–1.3)
Calcium: 8.9 mg/dL (ref 8.4–10.4)
Chloride: 108 mEq/L (ref 98–109)
EGFR: 90 mL/min/{1.73_m2} — ABNORMAL LOW (ref >90)
GLUCOSE: 104 mg/dL (ref 70–140)
Potassium: 3.9 mEq/L (ref 3.5–5.1)
SODIUM: 139 meq/L (ref 136–145)
TOTAL PROTEIN: 6.2 g/dL — AB (ref 6.4–8.3)
Total Bilirubin: 0.62 mg/dL (ref 0.20–1.20)

## 2017-07-17 LAB — PROTIME-INR
INR: 3.8 — AB (ref 2.00–3.50)
PROTIME: 45.6 s — AB (ref 10.6–13.4)

## 2017-07-17 MED ORDER — SODIUM CHLORIDE 0.9 % IV SOLN
2400.0000 mg/m2 | INTRAVENOUS | Status: DC
  Administered 2017-07-17: 6300 mg via INTRAVENOUS
  Filled 2017-07-17: qty 126

## 2017-07-17 MED ORDER — HEPARIN SOD (PORK) LOCK FLUSH 100 UNIT/ML IV SOLN
500.0000 [IU] | Freq: Once | INTRAVENOUS | Status: DC | PRN
  Filled 2017-07-17: qty 5

## 2017-07-17 MED ORDER — SODIUM CHLORIDE 0.9% FLUSH
10.0000 mL | Freq: Once | INTRAVENOUS | Status: AC
  Administered 2017-07-17: 10 mL
  Filled 2017-07-17: qty 10

## 2017-07-17 MED ORDER — SODIUM CHLORIDE 0.9% FLUSH
10.0000 mL | INTRAVENOUS | Status: DC | PRN
  Filled 2017-07-17: qty 10

## 2017-07-17 MED ORDER — DEXTROSE 5 % IV SOLN
Freq: Once | INTRAVENOUS | Status: AC
  Administered 2017-07-17: 13:00:00 via INTRAVENOUS

## 2017-07-17 MED ORDER — LEUCOVORIN CALCIUM INJECTION 350 MG
400.0000 mg/m2 | Freq: Once | INTRAVENOUS | Status: AC
  Administered 2017-07-17: 1052 mg via INTRAVENOUS
  Filled 2017-07-17: qty 52.6

## 2017-07-17 MED ORDER — FLUOROURACIL CHEMO INJECTION 2.5 GM/50ML
400.0000 mg/m2 | Freq: Once | INTRAVENOUS | Status: AC
  Administered 2017-07-17: 1050 mg via INTRAVENOUS
  Filled 2017-07-17: qty 21

## 2017-07-17 NOTE — Patient Instructions (Signed)
[facility] Discharge Instructions for Patients Receiving Chemotherapy  Today you received the following chemotherapy agents: Leucovorin and Adrucil  To help prevent nausea and vomiting after your treatment, we encourage you to take your nausea medication as directed.    If you develop nausea and vomiting that is not controlled by your nausea medication, call the clinic.   BELOW ARE SYMPTOMS THAT SHOULD BE REPORTED IMMEDIATELY:  *FEVER GREATER THAN 100.5 F  *CHILLS WITH OR WITHOUT FEVER  NAUSEA AND VOMITING THAT IS NOT CONTROLLED WITH YOUR NAUSEA MEDICATION  *UNUSUAL SHORTNESS OF BREATH  *UNUSUAL BRUISING OR BLEEDING  TENDERNESS IN MOUTH AND THROAT WITH OR WITHOUT PRESENCE OF ULCERS  *URINARY PROBLEMS  *BOWEL PROBLEMS  UNUSUAL RASH Items with * indicate a potential emergency and should be followed up as soon as possible.  Feel free to call the clinic you have any questions or concerns. The clinic phone number is [phone].  Please show the CHEMO ALERT CARD at check-in to the Emergency Department and triage nurse.   

## 2017-07-17 NOTE — Progress Notes (Signed)
  [name] OFFICE PROGRESS NOTE   Diagnosis:  Colon cancer  INTERVAL HISTORY:   [name] returns as scheduled. He completed cycle 1 FOLFOX 07/03/2017. He denies nausea/vomiting. No mouth sores. No diarrhea. He notes an alteration in taste. No numbness or tingling in his hands or feet. No bleeding.  Objective:  Vital signs in last 24 hours:  Blood pressure (!) 123/49, pulse 84, temperature 98.5 F (36.9 C), temperature source Oral, resp. rate (!) 24, height '6\' 2"'$  (1.88 m), weight 282 lb (127.9 kg), SpO2 99 %.    HEENT: No thrush or ulcers. Resp: Lungs clear bilaterally. Cardio: Regular rate and rhythm. GI: Abdomen soft and nontender. No hepatomegaly. Vascular: Trace lower leg edema bilaterally.  Skin: Palms without erythema.  Port-A-Cath without erythema.  Lab Results:  Lab Results  Component Value Date   WBC 6.4 07/02/2017   HGB 9.3 (L) 07/02/2017   HCT 30.6 (L) 07/02/2017   MCV 81.4 07/02/2017   PLT 295 07/02/2017   NEUTROABS 2.8 06/27/2017  PT/INR 45.6/3.8   Imaging:  No results found.  Medications: I have reviewed the patient's current medications.  Assessment/Plan: 1. Colon cancer-synchronous transverse and ascending colon masses, and multiple polyps noted on colonoscopy 04/25/2017 ? 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 Staging CT 04/25/2017 with synchronous masses at the cecum and transverse colon with evidence of locally metastatic lymph node/direct extension of tumor ? 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 Cycle 1 FOLFOX 07/03/2017 ? Cycle 2 FOLFOX 07/17/2017 (oxaliplatin held due to mild neutropenia)  2. Multiple polyps noted in the descending, sigmoid, and transverse colon on colonoscopy 04/25/2017-biopsy  of a transverse colon polyp revealed low and high-grade dysplasia, other polyps returned as hyperplastic polyps and fragments of a tubular adenoma  3. History of iron deficiency anemia secondary to #1  4. Anxiety  5. Hypertension  6. Glaucoma  7. Atrial fibrillation-maintained on Coumadin  8. Multiple low-density pancreas lesions noted on CT 04/25/2017-likely benign    Disposition: [name] appears stable. He has completed 1 cycle of FOLFOX. Plan to proceed with cycle 2 today as scheduled. Oxaliplatin will be held due to mild neutropenia. Neutropenic precautions reviewed. He understands to contact the office with fever, chills, other signs of infection. The plan is to add Neulasta with cycle 3 on the day of pump discontinuation.  The PT/INR is supratherapeutic. He will hold Coumadin for 2 days and resume on 07/19/2017 at a dose of 2.5 mg daily. He will return for a repeat PT/INR on 07/25/2017. He understands to contact the office with any bleeding.  We will see him in follow-up in 2 weeks with cycle 3 FOLFOX. He will contact the office in the interim as outlined above or with any other problems.  Plan reviewed with Dr. Benay Spice. 25 minutes were spent face-to-face at today's visit with the majority of that time involved and counseling/coordination of care.    [name], [name] ANP/GNP-BC   07/17/2017  11:55 AM

## 2017-07-19 ENCOUNTER — Ambulatory Visit (HOSPITAL_BASED_OUTPATIENT_CLINIC_OR_DEPARTMENT_OTHER): Payer: Medicare HMO

## 2017-07-19 VITALS — BP 132/60 | HR 82 | Temp 98.1°F | Resp 18

## 2017-07-19 DIAGNOSIS — C188 Malignant neoplasm of overlapping sites of colon: Secondary | ICD-10-CM

## 2017-07-19 DIAGNOSIS — C182 Malignant neoplasm of ascending colon: Secondary | ICD-10-CM | POA: Diagnosis not present

## 2017-07-19 DIAGNOSIS — C184 Malignant neoplasm of transverse colon: Secondary | ICD-10-CM | POA: Diagnosis not present

## 2017-07-19 MED ORDER — HEPARIN SOD (PORK) LOCK FLUSH 100 UNIT/ML IV SOLN
500.0000 [IU] | Freq: Once | INTRAVENOUS | Status: AC | PRN
  Administered 2017-07-19: 500 [IU]
  Filled 2017-07-19: qty 5

## 2017-07-19 MED ORDER — SODIUM CHLORIDE 0.9% FLUSH
10.0000 mL | INTRAVENOUS | Status: DC | PRN
  Administered 2017-07-19: 10 mL
  Filled 2017-07-19: qty 10

## 2017-07-22 ENCOUNTER — Telehealth: Payer: Self-pay | Admitting: Oncology

## 2017-07-22 NOTE — Telephone Encounter (Signed)
Scheduled appt per 9/12 los - left message with appt time and date. -

## 2017-07-25 ENCOUNTER — Telehealth: Payer: Self-pay | Admitting: *Deleted

## 2017-07-25 ENCOUNTER — Other Ambulatory Visit (HOSPITAL_BASED_OUTPATIENT_CLINIC_OR_DEPARTMENT_OTHER): Payer: Medicare HMO

## 2017-07-25 DIAGNOSIS — I4891 Unspecified atrial fibrillation: Secondary | ICD-10-CM

## 2017-07-25 DIAGNOSIS — C189 Malignant neoplasm of colon, unspecified: Secondary | ICD-10-CM

## 2017-07-25 DIAGNOSIS — Z7901 Long term (current) use of anticoagulants: Secondary | ICD-10-CM

## 2017-07-25 LAB — PROTIME-INR
INR: 3.2 (ref 2.00–3.50)
Protime: 38.4 Seconds — ABNORMAL HIGH (ref 10.6–13.4)

## 2017-07-25 MED ORDER — WARFARIN SODIUM 5 MG PO TABS: 2.5000 mg | ORAL_TABLET | Freq: Every day | ORAL | Status: DC

## 2017-07-25 NOTE — Telephone Encounter (Signed)
Confirmed Coumadin dose with pt, he reports he is taking 2.5 mg since 07/19/17.  PT/INR reviewed by Dr. Benay Spice: Continue same dose, will repeat next visit. Pt made aware, he voiced understanding.

## 2017-07-28 ENCOUNTER — Other Ambulatory Visit: Payer: Self-pay | Admitting: Oncology

## 2017-07-31 ENCOUNTER — Ambulatory Visit (HOSPITAL_BASED_OUTPATIENT_CLINIC_OR_DEPARTMENT_OTHER): Payer: Medicare HMO

## 2017-07-31 ENCOUNTER — Ambulatory Visit (HOSPITAL_BASED_OUTPATIENT_CLINIC_OR_DEPARTMENT_OTHER): Payer: Medicare HMO | Admitting: Oncology

## 2017-07-31 ENCOUNTER — Telehealth: Payer: Self-pay

## 2017-07-31 ENCOUNTER — Other Ambulatory Visit (HOSPITAL_BASED_OUTPATIENT_CLINIC_OR_DEPARTMENT_OTHER): Payer: Medicare HMO

## 2017-07-31 VITALS — BP 106/50 | HR 71 | Temp 98.2°F | Resp 17 | Ht 74.0 in | Wt 283.4 lb

## 2017-07-31 DIAGNOSIS — C180 Malignant neoplasm of cecum: Secondary | ICD-10-CM

## 2017-07-31 DIAGNOSIS — I4891 Unspecified atrial fibrillation: Secondary | ICD-10-CM

## 2017-07-31 DIAGNOSIS — Z5111 Encounter for antineoplastic chemotherapy: Secondary | ICD-10-CM | POA: Diagnosis not present

## 2017-07-31 DIAGNOSIS — D630 Anemia in neoplastic disease: Secondary | ICD-10-CM | POA: Diagnosis not present

## 2017-07-31 DIAGNOSIS — Z95828 Presence of other vascular implants and grafts: Secondary | ICD-10-CM

## 2017-07-31 DIAGNOSIS — Z7901 Long term (current) use of anticoagulants: Secondary | ICD-10-CM | POA: Diagnosis not present

## 2017-07-31 DIAGNOSIS — C184 Malignant neoplasm of transverse colon: Secondary | ICD-10-CM

## 2017-07-31 DIAGNOSIS — C189 Malignant neoplasm of colon, unspecified: Secondary | ICD-10-CM | POA: Diagnosis not present

## 2017-07-31 DIAGNOSIS — Z452 Encounter for adjustment and management of vascular access device: Secondary | ICD-10-CM

## 2017-07-31 DIAGNOSIS — C188 Malignant neoplasm of overlapping sites of colon: Secondary | ICD-10-CM

## 2017-07-31 DIAGNOSIS — I10 Essential (primary) hypertension: Secondary | ICD-10-CM

## 2017-07-31 LAB — COMPREHENSIVE METABOLIC PANEL
ALBUMIN: 2.9 g/dL — AB (ref 3.5–5.0)
ALK PHOS: 105 U/L (ref 40–150)
ALT: 11 U/L (ref 0–55)
AST: 18 U/L (ref 5–34)
Anion Gap: 7 mEq/L (ref 3–11)
BUN: 11 mg/dL (ref 7.0–26.0)
CALCIUM: 8.8 mg/dL (ref 8.4–10.4)
CO2: 25 mEq/L (ref 22–29)
CREATININE: 0.8 mg/dL (ref 0.7–1.3)
Chloride: 107 mEq/L (ref 98–109)
EGFR: 88 mL/min/{1.73_m2} — ABNORMAL LOW (ref >90)
Glucose: 133 mg/dl (ref 70–140)
POTASSIUM: 3.9 meq/L (ref 3.5–5.1)
Sodium: 140 mEq/L (ref 136–145)
Total Bilirubin: 0.73 mg/dL (ref 0.20–1.20)
Total Protein: 6.2 g/dL — ABNORMAL LOW (ref 6.4–8.3)

## 2017-07-31 LAB — CBC WITH DIFFERENTIAL/PLATELET
BASO%: 1.4 % (ref 0.0–2.0)
Basophils Absolute: 0.1 10*3/uL (ref 0.0–0.1)
EOS%: 3 % (ref 0.0–7.0)
Eosinophils Absolute: 0.2 10*3/uL (ref 0.0–0.5)
HCT: 31.3 % — ABNORMAL LOW (ref 38.4–49.9)
HGB: 10 g/dL — ABNORMAL LOW (ref 13.0–17.1)
LYMPH%: 19.6 % (ref 14.0–49.0)
MCH: 24.9 pg — ABNORMAL LOW (ref 27.2–33.4)
MCHC: 32.1 g/dL (ref 32.0–36.0)
MCV: 77.8 fL — ABNORMAL LOW (ref 79.3–98.0)
MONO#: 0.8 10*3/uL (ref 0.1–0.9)
MONO%: 13.4 % (ref 0.0–14.0)
NEUT%: 62.6 % (ref 39.0–75.0)
NEUTROS ABS: 3.6 10*3/uL (ref 1.5–6.5)
PLATELETS: 183 10*3/uL (ref 140–400)
RBC: 4.03 10*6/uL — AB (ref 4.20–5.82)
RDW: 19.1 % — ABNORMAL HIGH (ref 11.0–14.6)
WBC: 5.8 10*3/uL (ref 4.0–10.3)
lymph#: 1.1 10*3/uL (ref 0.9–3.3)

## 2017-07-31 LAB — PROTIME-INR
INR: 4.7 — ABNORMAL HIGH (ref 2.00–3.50)
PROTIME: 56.4 s — AB (ref 10.6–13.4)

## 2017-07-31 MED ORDER — DEXAMETHASONE SODIUM PHOSPHATE 10 MG/ML IJ SOLN
INTRAMUSCULAR | Status: AC
  Filled 2017-07-31: qty 1

## 2017-07-31 MED ORDER — FLUOROURACIL CHEMO INJECTION 5 GM/100ML
2400.0000 mg/m2 | INTRAVENOUS | Status: DC
  Administered 2017-07-31: 6300 mg via INTRAVENOUS
  Filled 2017-07-31: qty 126

## 2017-07-31 MED ORDER — DEXTROSE 5 % IV SOLN
Freq: Once | INTRAVENOUS | Status: AC
  Administered 2017-07-31: 10:00:00 via INTRAVENOUS

## 2017-07-31 MED ORDER — SODIUM CHLORIDE 0.9% FLUSH
10.0000 mL | Freq: Once | INTRAVENOUS | Status: AC
  Administered 2017-07-31: 10 mL
  Filled 2017-07-31: qty 10

## 2017-07-31 MED ORDER — PALONOSETRON HCL INJECTION 0.25 MG/5ML
INTRAVENOUS | Status: AC
  Filled 2017-07-31: qty 5

## 2017-07-31 MED ORDER — PALONOSETRON HCL INJECTION 0.25 MG/5ML
0.2500 mg | Freq: Once | INTRAVENOUS | Status: AC
  Administered 2017-07-31: 0.25 mg via INTRAVENOUS

## 2017-07-31 MED ORDER — DEXTROSE 5 % IV SOLN
400.0000 mg/m2 | Freq: Once | INTRAVENOUS | Status: AC
  Administered 2017-07-31: 1052 mg via INTRAVENOUS
  Filled 2017-07-31: qty 52.6

## 2017-07-31 MED ORDER — FLUOROURACIL CHEMO INJECTION 2.5 GM/50ML
400.0000 mg/m2 | Freq: Once | INTRAVENOUS | Status: AC
Start: 2017-07-31 — End: 2017-07-31
  Administered 2017-07-31: 1050 mg via INTRAVENOUS
  Filled 2017-07-31: qty 21

## 2017-07-31 MED ORDER — DEXAMETHASONE SODIUM PHOSPHATE 10 MG/ML IJ SOLN
10.0000 mg | Freq: Once | INTRAMUSCULAR | Status: AC
  Administered 2017-07-31: 10 mg via INTRAVENOUS

## 2017-07-31 MED ORDER — OXALIPLATIN CHEMO INJECTION 100 MG/20ML
85.0000 mg/m2 | Freq: Once | INTRAVENOUS | Status: AC
  Administered 2017-07-31: 225 mg via INTRAVENOUS
  Filled 2017-07-31: qty 40

## 2017-07-31 NOTE — Patient Instructions (Signed)
[facility] Discharge Instructions for Patients Receiving Chemotherapy  Today you received the following chemotherapy agents: Oxaliplatin, Leucovorin, 26fu   To help prevent nausea and vomiting after your treatment, we encourage you to take your nausea medication as prescribed. If you develop nausea and vomiting that is not controlled by your nausea medication, call the clinic.   BELOW ARE SYMPTOMS THAT SHOULD BE REPORTED IMMEDIATELY:  *FEVER GREATER THAN 100.5 F  *CHILLS WITH OR WITHOUT FEVER  NAUSEA AND VOMITING THAT IS NOT CONTROLLED WITH YOUR NAUSEA MEDICATION  *UNUSUAL SHORTNESS OF BREATH  *UNUSUAL BRUISING OR BLEEDING  TENDERNESS IN MOUTH AND THROAT WITH OR WITHOUT PRESENCE OF ULCERS  *URINARY PROBLEMS  *BOWEL PROBLEMS  UNUSUAL RASH Items with * indicate a potential emergency and should be followed up as soon as possible.  Feel free to call the clinic you have any questions or concerns. The clinic phone number is [phone].  Please show the [facility] at check-in to the Emergency Department and triage nurse.  Oxaliplatin Injection What is this medicine? OXALIPLATIN (ox AL i PLA tin) is a chemotherapy drug. It targets fast dividing cells, like cancer cells, and causes these cells to die. This medicine is used to treat cancers of the colon and rectum, and many other cancers. This medicine may be used for other purposes; ask your health care provider or pharmacist if you have questions. COMMON BRAND NAME(S): Eloxatin What should I tell my health care provider before I take this medicine? They need to know if you have any of these conditions: -kidney disease -an unusual or allergic reaction to oxaliplatin, other chemotherapy, other medicines, foods, dyes, or preservatives -pregnant or trying to get pregnant -breast-feeding How should I use this medicine? This drug is given as an infusion into a vein. It is administered in a hospital or clinic by  a specially trained health care professional. Talk to your pediatrician regarding the use of this medicine in children. Special care may be needed. Overdosage: If you think you have taken too much of this medicine contact a poison control center or emergency room at once. NOTE: This medicine is only for you. Do not share this medicine with others. What if I miss a dose? It is important not to miss a dose. Call your doctor or health care professional if you are unable to keep an appointment. What may interact with this medicine? -medicines to increase blood counts like filgrastim, pegfilgrastim, sargramostim -probenecid -some antibiotics like amikacin, gentamicin, neomycin, polymyxin B, streptomycin, tobramycin -zalcitabine Talk to your doctor or health care professional before taking any of these medicines: -acetaminophen -aspirin -ibuprofen -ketoprofen -naproxen This list may not describe all possible interactions. Give your health care provider a list of all the medicines, herbs, non-prescription drugs, or dietary supplements you use. Also tell them if you smoke, drink alcohol, or use illegal drugs. Some items may interact with your medicine. What should I watch for while using this medicine? Your condition will be monitored carefully while you are receiving this medicine. You will need important blood work done while you are taking this medicine. This medicine can make you more sensitive to cold. Do not drink cold drinks or use ice. Cover exposed skin before coming in contact with cold temperatures or cold objects. When out in cold weather wear warm clothing and cover your mouth and nose to warm the air that goes into your lungs. Tell your doctor if you get sensitive to the cold. This drug may  make you feel generally unwell. This is not uncommon, as chemotherapy can affect healthy cells as well as cancer cells. Report any side effects. Continue your course of treatment even though you feel  ill unless your doctor tells you to stop. In some cases, you may be given additional medicines to help with side effects. Follow all directions for their use. Call your doctor or health care professional for advice if you get a fever, chills or sore throat, or other symptoms of a cold or flu. Do not treat yourself. This drug decreases your body's ability to fight infections. Try to avoid being around people who are sick. This medicine may increase your risk to bruise or bleed. Call your doctor or health care professional if you notice any unusual bleeding. Be careful brushing and flossing your teeth or using a toothpick because you may get an infection or bleed more easily. If you have any dental work done, tell your dentist you are receiving this medicine. Avoid taking products that contain aspirin, acetaminophen, ibuprofen, naproxen, or ketoprofen unless instructed by your doctor. These medicines may hide a fever. Do not become pregnant while taking this medicine. Women should inform their doctor if they wish to become pregnant or think they might be pregnant. There is a potential for serious side effects to an unborn child. Talk to your health care professional or pharmacist for more information. Do not breast-feed an infant while taking this medicine. Call your doctor or health care professional if you get diarrhea. Do not treat yourself. What side effects may I notice from receiving this medicine? Side effects that you should report to your doctor or health care professional as soon as possible: -allergic reactions like skin rash, itching or hives, swelling of the face, lips, or tongue -low blood counts - This drug may decrease the number of white blood cells, red blood cells and platelets. You may be at increased risk for infections and bleeding. -signs of infection - fever or chills, cough, sore throat, pain or difficulty passing urine -signs of decreased platelets or bleeding - bruising,  pinpoint red spots on the skin, black, tarry stools, nosebleeds -signs of decreased red blood cells - unusually weak or tired, fainting spells, lightheadedness -breathing problems -chest pain, pressure -cough -diarrhea -jaw tightness -mouth sores -nausea and vomiting -pain, swelling, redness or irritation at the injection site -pain, tingling, numbness in the hands or feet -problems with balance, talking, walking -redness, blistering, peeling or loosening of the skin, including inside the mouth -trouble passing urine or change in the amount of urine Side effects that usually do not require medical attention (report to your doctor or health care professional if they continue or are bothersome): -changes in vision -constipation -hair loss -loss of appetite -metallic taste in the mouth or changes in taste -stomach pain This list may not describe all possible side effects. Call your doctor for medical advice about side effects. You may report side effects to FDA at 1-800-FDA-1088. Where should I keep my medicine? This drug is given in a hospital or clinic and will not be stored at home. NOTE: This sheet is a summary. It may not cover all possible information. If you have questions about this medicine, talk to your doctor, pharmacist, or health care provider.  2018 Elsevier/Gold Standard (2008-05-18 17:22:47) Leucovorin injection What is this medicine? LEUCOVORIN (loo koe VOR in) is used to prevent or treat the harmful effects of some medicines. This medicine is used to treat anemia caused by a  low amount of folic acid in the body. It is also used with 5-fluorouracil (5-FU) to treat colon cancer. This medicine may be used for other purposes; ask your health care provider or pharmacist if you have questions. What should I tell my health care provider before I take this medicine? They need to know if you have any of these conditions: -anemia from low levels of vitamin B-12 in the blood -an  unusual or allergic reaction to leucovorin, folic acid, other medicines, foods, dyes, or preservatives -pregnant or trying to get pregnant -breast-feeding How should I use this medicine? This medicine is for injection into a muscle or into a vein. It is given by a health care professional in a hospital or clinic setting. Talk to your pediatrician regarding the use of this medicine in children. Special care may be needed. Overdosage: If you think you have taken too much of this medicine contact a poison control center or emergency room at once. NOTE: This medicine is only for you. Do not share this medicine with others. What if I miss a dose? This does not apply. What may interact with this medicine? -capecitabine -fluorouracil -phenobarbital -phenytoin -primidone -trimethoprim-sulfamethoxazole This list may not describe all possible interactions. Give your health care provider a list of all the medicines, herbs, non-prescription drugs, or dietary supplements you use. Also tell them if you smoke, drink alcohol, or use illegal drugs. Some items may interact with your medicine. What should I watch for while using this medicine? Your condition will be monitored carefully while you are receiving this medicine. This medicine may increase the side effects of 5-fluorouracil, 5-FU. Tell your doctor or health care professional if you have diarrhea or mouth sores that do not get better or that get worse. What side effects may I notice from receiving this medicine? Side effects that you should report to your doctor or health care professional as soon as possible: -allergic reactions like skin rash, itching or hives, swelling of the face, lips, or tongue -breathing problems -fever, infection -mouth sores -unusual bleeding or bruising -unusually weak or tired Side effects that usually do not require medical attention (report to your doctor or health care professional if they continue or are  bothersome): -constipation or diarrhea -loss of appetite -nausea, vomiting This list may not describe all possible side effects. Call your doctor for medical advice about side effects. You may report side effects to FDA at 1-800-FDA-1088. Where should I keep my medicine? This drug is given in a hospital or clinic and will not be stored at home. NOTE: This sheet is a summary. It may not cover all possible information. If you have questions about this medicine, talk to your doctor, pharmacist, or health care provider.  2018 Elsevier/Gold Standard (2008-04-27 16:50:29) Fluorouracil, 5-FU injection What is this medicine? FLUOROURACIL, 5-FU (flure oh YOOR a sil) is a chemotherapy drug. It slows the growth of cancer cells. This medicine is used to treat many types of cancer like breast cancer, colon or rectal cancer, pancreatic cancer, and stomach cancer. This medicine may be used for other purposes; ask your health care provider or pharmacist if you have questions. COMMON BRAND NAME(S): Adrucil What should I tell my health care provider before I take this medicine? They need to know if you have any of these conditions: -blood disorders -dihydropyrimidine dehydrogenase (DPD) deficiency -infection (especially a virus infection such as chickenpox, cold sores, or herpes) -kidney disease -liver disease -malnourished, poor nutrition -recent or ongoing radiation therapy -an unusual  or allergic reaction to fluorouracil, other chemotherapy, other medicines, foods, dyes, or preservatives -pregnant or trying to get pregnant -breast-feeding How should I use this medicine? This drug is given as an infusion or injection into a vein. It is administered in a hospital or clinic by a specially trained health care professional. Talk to your pediatrician regarding the use of this medicine in children. Special care may be needed. Overdosage: If you think you have taken too much of this medicine contact a poison  control center or emergency room at once. NOTE: This medicine is only for you. Do not share this medicine with others. What if I miss a dose? It is important not to miss your dose. Call your doctor or health care professional if you are unable to keep an appointment. What may interact with this medicine? -allopurinol -cimetidine -dapsone -digoxin -hydroxyurea -leucovorin -levamisole -medicines for seizures like ethotoin, fosphenytoin, phenytoin -medicines to increase blood counts like filgrastim, pegfilgrastim, sargramostim -medicines that treat or prevent blood clots like warfarin, enoxaparin, and dalteparin -methotrexate -metronidazole -pyrimethamine -some other chemotherapy drugs like busulfan, cisplatin, estramustine, vinblastine -trimethoprim -trimetrexate -vaccines Talk to your doctor or health care professional before taking any of these medicines: -acetaminophen -aspirin -ibuprofen -ketoprofen -naproxen This list may not describe all possible interactions. Give your health care provider a list of all the medicines, herbs, non-prescription drugs, or dietary supplements you use. Also tell them if you smoke, drink alcohol, or use illegal drugs. Some items may interact with your medicine. What should I watch for while using this medicine? Visit your doctor for checks on your progress. This drug may make you feel generally unwell. This is not uncommon, as chemotherapy can affect healthy cells as well as cancer cells. Report any side effects. Continue your course of treatment even though you feel ill unless your doctor tells you to stop. In some cases, you may be given additional medicines to help with side effects. Follow all directions for their use. Call your doctor or health care professional for advice if you get a fever, chills or sore throat, or other symptoms of a cold or flu. Do not treat yourself. This drug decreases your body's ability to fight infections. Try to avoid  being around people who are sick. This medicine may increase your risk to bruise or bleed. Call your doctor or health care professional if you notice any unusual bleeding. Be careful brushing and flossing your teeth or using a toothpick because you may get an infection or bleed more easily. If you have any dental work done, tell your dentist you are receiving this medicine. Avoid taking products that contain aspirin, acetaminophen, ibuprofen, naproxen, or ketoprofen unless instructed by your doctor. These medicines may hide a fever. Do not become pregnant while taking this medicine. Women should inform their doctor if they wish to become pregnant or think they might be pregnant. There is a potential for serious side effects to an unborn child. Talk to your health care professional or pharmacist for more information. Do not breast-feed an infant while taking this medicine. Men should inform their doctor if they wish to father a child. This medicine may lower sperm counts. Do not treat diarrhea with over the counter products. Contact your doctor if you have diarrhea that lasts more than 2 days or if it is severe and watery. This medicine can make you more sensitive to the sun. Keep out of the sun. If you cannot avoid being in the sun, wear protective clothing and use  sunscreen. Do not use sun lamps or tanning beds/booths. What side effects may I notice from receiving this medicine? Side effects that you should report to your doctor or health care professional as soon as possible: -allergic reactions like skin rash, itching or hives, swelling of the face, lips, or tongue -low blood counts - this medicine may decrease the number of white blood cells, red blood cells and platelets. You may be at increased risk for infections and bleeding. -signs of infection - fever or chills, cough, sore throat, pain or difficulty passing urine -signs of decreased platelets or bleeding - bruising, pinpoint red spots on the  skin, black, tarry stools, blood in the urine -signs of decreased red blood cells - unusually weak or tired, fainting spells, lightheadedness -breathing problems -changes in vision -chest pain -mouth sores -nausea and vomiting -pain, swelling, redness at site where injected -pain, tingling, numbness in the hands or feet -redness, swelling, or sores on hands or feet -stomach pain -unusual bleeding Side effects that usually do not require medical attention (report to your doctor or health care professional if they continue or are bothersome): -changes in finger or toe nails -diarrhea -dry or itchy skin -hair loss -headache -loss of appetite -sensitivity of eyes to the light -stomach upset -unusually teary eyes This list may not describe all possible side effects. Call your doctor for medical advice about side effects. You may report side effects to FDA at 1-800-FDA-1088. Where should I keep my medicine? This drug is given in a hospital or clinic and will not be stored at home. NOTE: This sheet is a summary. It may not cover all possible information. If you have questions about this medicine, talk to your doctor, pharmacist, or health care provider.  2018 [employer]vier/Gold Standard (2008-02-25 13:53:16)

## 2017-07-31 NOTE — Telephone Encounter (Signed)
Left detailed voice mail for patient appointment time change. Per 9/26 message

## 2017-07-31 NOTE — Progress Notes (Signed)
[facility] OFFICE PROGRESS NOTE   Diagnosis: Colon cancer  INTERVAL HISTORY:   [name] returns as scheduled. He completed a cycle of 5-FU/leucovorin 07/17/2017. No nausea or diarrhea. He had one mouth ulcer that has resolved. This did not interfere with eating. He reports malaise. No other complaint. No neuropathy symptoms.  Objective:  Vital signs in last 24 hours:  Blood pressure (!) 106/50, pulse 71, temperature 98.2 F (36.8 C), temperature source Oral, resp. rate 17, height 6' 2"  (1.88 m), weight 283 lb 6.4 oz (128.5 kg), SpO2 98 %.    HEENT: No thrush or ulcers Resp: Lungs clear bilaterally Cardio: Irregular GI: No hepatosplenomegaly, nontender Vascular: Trace pretibial edema bilaterally  Skin: Multiple erythematous abrasions over the extremities   Portacath/PICC-without erythema  Lab Results:  Lab Results  Component Value Date   WBC 5.8 07/31/2017   HGB 10.0 (L) 07/31/2017   HCT 31.3 (L) 07/31/2017   MCV 77.8 (L) 07/31/2017   PLT 183 07/31/2017   NEUTROABS 3.6 07/31/2017    CMP     Component Value Date/Time   NA 139 07/17/2017 1149   K 3.9 07/17/2017 1149   CL 108 07/02/2017 1144   CO2 26 07/17/2017 1149   GLUCOSE 104 07/17/2017 1149   BUN 8.5 07/17/2017 1149   CREATININE 0.8 07/17/2017 1149   CALCIUM 8.9 07/17/2017 1149   PROT 6.2 (L) 07/17/2017 1149   ALBUMIN 2.9 (L) 07/17/2017 1149   AST 19 07/17/2017 1149   ALT 15 07/17/2017 1149   ALKPHOS 93 07/17/2017 1149   BILITOT 0.62 07/17/2017 1149   GFRNONAA >60 07/02/2017 1144   GFRAA >60 07/02/2017 1144    Lab Results  Component Value Date   CEA1 1.8 06/06/2017    Lab Results  Component Value Date   INR 4.70 (H) 07/31/2017     Medications: I have reviewed the patient's current medications.  Assessment/Plan: 1. Colon cancer-synchronous transverse and ascending colon masses, and multiple polyps noted on colonoscopy 04/25/2017 ? 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 Staging CT 04/25/2017 with synchronous masses at the cecum and transverse colon with evidence of locally metastatic lymph node/direct extension of tumor ? 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 Cycle 1 FOLFOX 07/03/2017 ? Cycle 2 FOLFOX 07/17/2017 (oxaliplatin held due to mild neutropenia) ? Cycle 3 FOLFOX 07/31/2017  2. Multiple polyps noted in the descending, sigmoid, and transverse colon on colonoscopy 04/25/2017-biopsy of a transverse colon polyp revealed low and high-grade dysplasia, other polyps returned as hyperplastic polyps and fragments of a tubular adenoma  3. History of iron deficiency anemia secondary to #1  4. Anxiety  5. Hypertension  6. Glaucoma  7. Atrial fibrillation-maintained on Coumadin  8. Multiple low-density pancreas lesions noted on CT 04/25/2017-likely benign    Disposition:  [name] appears well. The white count has recovered. Oxaliplatin will be added back into the chemotherapy regimen today. Neulasta will be added with the next cycle of chemotherapy if the white count is lower. We discussed the potential side effects of Neulasta and he agrees to proceed.  The PT/INR is supratherapeutic. He will hold Coumadin for the next 2 days. We will check the PT/INR when he returns for the pump disconnect on 08/02/2017.  [name] will return for an office visit and the next cycle of chemotherapy in 2  weeks.  Donneta Romberg, MD  07/31/2017  9:29 AM

## 2017-08-02 ENCOUNTER — Telehealth: Payer: Self-pay

## 2017-08-02 ENCOUNTER — Ambulatory Visit (HOSPITAL_BASED_OUTPATIENT_CLINIC_OR_DEPARTMENT_OTHER): Payer: Medicare HMO

## 2017-08-02 ENCOUNTER — Other Ambulatory Visit: Payer: Self-pay

## 2017-08-02 VITALS — BP 133/69 | HR 82 | Temp 97.9°F | Resp 18

## 2017-08-02 DIAGNOSIS — C189 Malignant neoplasm of colon, unspecified: Secondary | ICD-10-CM

## 2017-08-02 DIAGNOSIS — Z7901 Long term (current) use of anticoagulants: Secondary | ICD-10-CM | POA: Diagnosis not present

## 2017-08-02 DIAGNOSIS — C180 Malignant neoplasm of cecum: Secondary | ICD-10-CM

## 2017-08-02 DIAGNOSIS — C188 Malignant neoplasm of overlapping sites of colon: Secondary | ICD-10-CM

## 2017-08-02 DIAGNOSIS — I4891 Unspecified atrial fibrillation: Secondary | ICD-10-CM

## 2017-08-02 DIAGNOSIS — Z452 Encounter for adjustment and management of vascular access device: Secondary | ICD-10-CM | POA: Diagnosis not present

## 2017-08-02 LAB — PROTIME-INR
INR: 3.9 — ABNORMAL HIGH (ref 2.00–3.50)
PROTIME: 46.8 s — AB (ref 10.6–13.4)

## 2017-08-02 MED ORDER — HEPARIN SOD (PORK) LOCK FLUSH 100 UNIT/ML IV SOLN
500.0000 [IU] | Freq: Once | INTRAVENOUS | Status: AC | PRN
  Administered 2017-08-02: 500 [IU]
  Filled 2017-08-02: qty 5

## 2017-08-02 MED ORDER — SODIUM CHLORIDE 0.9% FLUSH
10.0000 mL | INTRAVENOUS | Status: DC | PRN
  Administered 2017-08-02: 10 mL
  Filled 2017-08-02: qty 10

## 2017-08-02 NOTE — Telephone Encounter (Signed)
VM left for patient to let him know to hold his coumadin dose 9/28 and 9/29 and then to resume take 2.5mg  every other day. Left message that scheduling would call with a lab appointment on 08/08/17.

## 2017-08-02 NOTE — Patient Instructions (Signed)
Implanted Port Home Guide An implanted port is a type of central line that is placed under the skin. Central lines are used to provide IV access when treatment or nutrition needs to be given through a person's veins. Implanted ports are used for long-term IV access. An implanted port may be placed because:  You need IV medicine that would be irritating to the small veins in your hands or arms.  You need long-term IV medicines, such as antibiotics.  You need IV nutrition for a long period.  You need frequent blood draws for lab tests.  You need dialysis.  Implanted ports are usually placed in the chest area, but they can also be placed in the upper arm, the abdomen, or the leg. An implanted port has two main parts:  Reservoir. The reservoir is round and will appear as a small, raised area under your skin. The reservoir is the part where a needle is inserted to give medicines or draw blood.  Catheter. The catheter is a thin, flexible tube that extends from the reservoir. The catheter is placed into a large vein. Medicine that is inserted into the reservoir goes into the catheter and then into the vein.  How will I care for my incision site? Do not get the incision site wet. Bathe or shower as directed by your health care provider. How is my port accessed? Special steps must be taken to access the port:  Before the port is accessed, a numbing cream can be placed on the skin. This helps numb the skin over the port site.  Your health care provider uses a sterile technique to access the port. ? Your health care provider must put on a mask and sterile gloves. ? The skin over your port is cleaned carefully with an antiseptic and allowed to dry. ? The port is gently pinched between sterile gloves, and a needle is inserted into the port.  Only "non-coring" port needles should be used to access the port. Once the port is accessed, a blood return should be checked. This helps ensure that the port  is in the vein and is not clogged.  If your port needs to remain accessed for a constant infusion, a clear (transparent) bandage will be placed over the needle site. The bandage and needle will need to be changed every week, or as directed by your health care provider.  Keep the bandage covering the needle clean and dry. Do not get it wet. Follow your health care provider's instructions on how to take a shower or bath while the port is accessed.  If your port does not need to stay accessed, no bandage is needed over the port.  What is flushing? Flushing helps keep the port from getting clogged. Follow your health care provider's instructions on how and when to flush the port. Ports are usually flushed with saline solution or a medicine called heparin. The need for flushing will depend on how the port is used.  If the port is used for intermittent medicines or blood draws, the port will need to be flushed: ? After medicines have been given. ? After blood has been drawn. ? As part of routine maintenance.  If a constant infusion is running, the port may not need to be flushed.  How long will my port stay implanted? The port can stay in for as long as your health care provider thinks it is needed. When it is time for the port to come out, surgery will be   done to remove it. The procedure is similar to the one performed when the port was put in. When should I seek immediate medical care? When you have an implanted port, you should seek immediate medical care if:  You notice a bad smell coming from the incision site.  You have swelling, redness, or drainage at the incision site.  You have more swelling or pain at the port site or the surrounding area.  You have a fever that is not controlled with medicine.  This information is not intended to replace advice given to you by your health care provider. Make sure you discuss any questions you have with your health care provider. Document  Released: 10/22/2005 Document Revised: 03/29/2016 Document Reviewed: 06/29/2013 Elsevier Interactive Patient Education  2017 Elsevier Inc.  

## 2017-08-03 DIAGNOSIS — C189 Malignant neoplasm of colon, unspecified: Secondary | ICD-10-CM | POA: Diagnosis not present

## 2017-08-05 ENCOUNTER — Telehealth: Payer: Self-pay | Admitting: Oncology

## 2017-08-05 NOTE — Telephone Encounter (Signed)
Left voicemail for patient regarding the changes in their appt.

## 2017-08-05 NOTE — Telephone Encounter (Signed)
Scheduled appt per 9/26 los - patient to get new schedule next visit.  

## 2017-08-08 ENCOUNTER — Other Ambulatory Visit (HOSPITAL_BASED_OUTPATIENT_CLINIC_OR_DEPARTMENT_OTHER): Payer: Medicare HMO

## 2017-08-08 ENCOUNTER — Telehealth: Payer: Self-pay | Admitting: Emergency Medicine

## 2017-08-08 ENCOUNTER — Telehealth: Payer: Self-pay | Admitting: *Deleted

## 2017-08-08 DIAGNOSIS — I4891 Unspecified atrial fibrillation: Secondary | ICD-10-CM | POA: Diagnosis not present

## 2017-08-08 DIAGNOSIS — Z7901 Long term (current) use of anticoagulants: Secondary | ICD-10-CM | POA: Diagnosis not present

## 2017-08-08 DIAGNOSIS — C189 Malignant neoplasm of colon, unspecified: Secondary | ICD-10-CM

## 2017-08-08 LAB — PROTIME-INR
INR: 1.8 — ABNORMAL LOW (ref 2.00–3.50)
Protime: 21.6 Seconds — ABNORMAL HIGH (ref 10.6–13.4)

## 2017-08-08 NOTE — Telephone Encounter (Signed)
-----   Message from Ladell Pier, MD sent at 08/08/2017  1:53 PM EDT ----- Please call patient, same coumadin,f/u as scheduled

## 2017-08-08 NOTE — Telephone Encounter (Signed)
Left message on voicemail for pt to continue same dose of Coumadin. Will repeat lab at next visit.

## 2017-08-08 NOTE — Telephone Encounter (Addendum)
Left pt a VM to call back regarding lab work.   ----- Message from Ladell Pier, MD sent at 08/08/2017  1:53 PM EDT ----- Please call patient, same coumadin,f/u as scheduled

## 2017-08-09 ENCOUNTER — Other Ambulatory Visit: Payer: Self-pay | Admitting: Oncology

## 2017-08-14 ENCOUNTER — Ambulatory Visit (HOSPITAL_BASED_OUTPATIENT_CLINIC_OR_DEPARTMENT_OTHER): Payer: Medicare HMO | Admitting: Oncology

## 2017-08-14 ENCOUNTER — Telehealth: Payer: Self-pay | Admitting: Oncology

## 2017-08-14 ENCOUNTER — Ambulatory Visit (HOSPITAL_BASED_OUTPATIENT_CLINIC_OR_DEPARTMENT_OTHER): Payer: Medicare HMO

## 2017-08-14 ENCOUNTER — Ambulatory Visit: Payer: Medicare HMO

## 2017-08-14 ENCOUNTER — Other Ambulatory Visit (HOSPITAL_BASED_OUTPATIENT_CLINIC_OR_DEPARTMENT_OTHER): Payer: Medicare HMO

## 2017-08-14 VITALS — BP 109/58 | HR 70 | Temp 97.9°F | Ht 74.0 in | Wt 280.8 lb

## 2017-08-14 DIAGNOSIS — Z95828 Presence of other vascular implants and grafts: Secondary | ICD-10-CM

## 2017-08-14 DIAGNOSIS — C184 Malignant neoplasm of transverse colon: Secondary | ICD-10-CM

## 2017-08-14 DIAGNOSIS — Z5111 Encounter for antineoplastic chemotherapy: Secondary | ICD-10-CM | POA: Diagnosis not present

## 2017-08-14 DIAGNOSIS — C182 Malignant neoplasm of ascending colon: Secondary | ICD-10-CM

## 2017-08-14 DIAGNOSIS — Z452 Encounter for adjustment and management of vascular access device: Secondary | ICD-10-CM

## 2017-08-14 DIAGNOSIS — C189 Malignant neoplasm of colon, unspecified: Secondary | ICD-10-CM

## 2017-08-14 DIAGNOSIS — D630 Anemia in neoplastic disease: Secondary | ICD-10-CM | POA: Diagnosis not present

## 2017-08-14 DIAGNOSIS — I10 Essential (primary) hypertension: Secondary | ICD-10-CM

## 2017-08-14 DIAGNOSIS — C188 Malignant neoplasm of overlapping sites of colon: Secondary | ICD-10-CM

## 2017-08-14 DIAGNOSIS — I4891 Unspecified atrial fibrillation: Secondary | ICD-10-CM | POA: Diagnosis not present

## 2017-08-14 LAB — COMPREHENSIVE METABOLIC PANEL
ALT: 18 U/L (ref 0–55)
ANION GAP: 8 meq/L (ref 3–11)
AST: 23 U/L (ref 5–34)
Albumin: 2.9 g/dL — ABNORMAL LOW (ref 3.5–5.0)
Alkaline Phosphatase: 91 U/L (ref 40–150)
BILIRUBIN TOTAL: 0.8 mg/dL (ref 0.20–1.20)
BUN: 9.8 mg/dL (ref 7.0–26.0)
CHLORIDE: 107 meq/L (ref 98–109)
CO2: 24 meq/L (ref 22–29)
CREATININE: 0.8 mg/dL (ref 0.7–1.3)
Calcium: 8.6 mg/dL (ref 8.4–10.4)
EGFR: >60 mL/min/{1.73_m2} (ref >60)
GLUCOSE: 116 mg/dL (ref 70–140)
Potassium: 3.8 mEq/L (ref 3.5–5.1)
SODIUM: 140 meq/L (ref 136–145)
TOTAL PROTEIN: 6.2 g/dL — AB (ref 6.4–8.3)

## 2017-08-14 LAB — CBC WITH DIFFERENTIAL/PLATELET
BASO%: 2.4 % — AB (ref 0.0–2.0)
Basophils Absolute: 0.1 10*3/uL (ref 0.0–0.1)
EOS ABS: 0.1 10*3/uL (ref 0.0–0.5)
EOS%: 4.1 % (ref 0.0–7.0)
HCT: 31.4 % — ABNORMAL LOW (ref 38.4–49.9)
HGB: 10 g/dL — ABNORMAL LOW (ref 13.0–17.1)
LYMPH%: 27.2 % (ref 14.0–49.0)
MCH: 24.8 pg — ABNORMAL LOW (ref 27.2–33.4)
MCHC: 31.8 g/dL — ABNORMAL LOW (ref 32.0–36.0)
MCV: 78 fL — ABNORMAL LOW (ref 79.3–98.0)
MONO#: 0.6 10*3/uL (ref 0.1–0.9)
MONO%: 16.2 % — AB (ref 0.0–14.0)
NEUT%: 50.1 % (ref 39.0–75.0)
NEUTROS ABS: 1.7 10*3/uL (ref 1.5–6.5)
Platelets: 141 10*3/uL (ref 140–400)
RBC: 4.03 10*6/uL — ABNORMAL LOW (ref 4.20–5.82)
RDW: 20.2 % — AB (ref 11.0–14.6)
WBC: 3.5 10*3/uL — AB (ref 4.0–10.3)
lymph#: 0.9 10*3/uL (ref 0.9–3.3)

## 2017-08-14 LAB — PROTIME-INR
INR: 4 — AB (ref 2.00–3.50)
Protime: 48 Seconds — ABNORMAL HIGH (ref 10.6–13.4)

## 2017-08-14 MED ORDER — SODIUM CHLORIDE 0.9% FLUSH
10.0000 mL | Freq: Once | INTRAVENOUS | Status: AC
  Administered 2017-08-14: 10 mL
  Filled 2017-08-14: qty 10

## 2017-08-14 MED ORDER — LEUCOVORIN CALCIUM INJECTION 350 MG
400.0000 mg/m2 | Freq: Once | INTRAMUSCULAR | Status: AC
  Administered 2017-08-14: 1036 mg via INTRAVENOUS
  Filled 2017-08-14: qty 51.8

## 2017-08-14 MED ORDER — DEXTROSE 5 % IV SOLN
Freq: Once | INTRAVENOUS | Status: AC
  Administered 2017-08-14: 12:00:00 via INTRAVENOUS

## 2017-08-14 MED ORDER — PALONOSETRON HCL INJECTION 0.25 MG/5ML
INTRAVENOUS | Status: AC
  Filled 2017-08-14: qty 5

## 2017-08-14 MED ORDER — PALONOSETRON HCL INJECTION 0.25 MG/5ML
0.2500 mg | Freq: Once | INTRAVENOUS | Status: AC
  Administered 2017-08-14: 0.25 mg via INTRAVENOUS

## 2017-08-14 MED ORDER — DEXAMETHASONE SODIUM PHOSPHATE 10 MG/ML IJ SOLN
10.0000 mg | Freq: Once | INTRAMUSCULAR | Status: AC
  Administered 2017-08-14: 10 mg via INTRAVENOUS

## 2017-08-14 MED ORDER — DEXAMETHASONE SODIUM PHOSPHATE 10 MG/ML IJ SOLN
INTRAMUSCULAR | Status: AC
Start: 2017-08-14 — End: ?
  Filled 2017-08-14: qty 1

## 2017-08-14 MED ORDER — FLUOROURACIL CHEMO INJECTION 2.5 GM/50ML
400.0000 mg/m2 | Freq: Once | INTRAVENOUS | Status: AC
  Administered 2017-08-14: 1050 mg via INTRAVENOUS
  Filled 2017-08-14: qty 21

## 2017-08-14 MED ORDER — FLUOROURACIL CHEMO INJECTION 5 GM/100ML
2400.0000 mg/m2 | INTRAVENOUS | Status: DC
  Administered 2017-08-14: 6200 mg via INTRAVENOUS
  Filled 2017-08-14: qty 124

## 2017-08-14 MED ORDER — HEPARIN SOD (PORK) LOCK FLUSH 100 UNIT/ML IV SOLN
500.0000 [IU] | Freq: Once | INTRAVENOUS | Status: AC
  Administered 2017-08-14: 500 [IU]
  Filled 2017-08-14: qty 5

## 2017-08-14 MED ORDER — DEXTROSE 5 % IV SOLN
86.0000 mg/m2 | Freq: Once | INTRAVENOUS | Status: AC
  Administered 2017-08-14: 225 mg via INTRAVENOUS
  Filled 2017-08-14: qty 45

## 2017-08-14 NOTE — Patient Instructions (Signed)
[facility] Discharge Instructions for Patients Receiving Chemotherapy  Today you received the following chemotherapy agents: Oxaliplatin, Leucovorin, 82fu   To help prevent nausea and vomiting after your treatment, we encourage you to take your nausea medication as prescribed. If you develop nausea and vomiting that is not controlled by your nausea medication, call the clinic.   BELOW ARE SYMPTOMS THAT SHOULD BE REPORTED IMMEDIATELY:  *FEVER GREATER THAN 100.5 F  *CHILLS WITH OR WITHOUT FEVER  NAUSEA AND VOMITING THAT IS NOT CONTROLLED WITH YOUR NAUSEA MEDICATION  *UNUSUAL SHORTNESS OF BREATH  *UNUSUAL BRUISING OR BLEEDING  TENDERNESS IN MOUTH AND THROAT WITH OR WITHOUT PRESENCE OF ULCERS  *URINARY PROBLEMS  *BOWEL PROBLEMS  UNUSUAL RASH Items with * indicate a potential emergency and should be followed up as soon as possible.  Feel free to call the clinic you have any questions or concerns. The clinic phone number is [phone].  Please show the [place] at check-in to the Emergency Department and triage nurse.  Oxaliplatin Injection What is this medicine? OXALIPLATIN (ox AL i PLA tin) is a chemotherapy drug. It targets fast dividing cells, like cancer cells, and causes these cells to die. This medicine is used to treat cancers of the colon and rectum, and many other cancers. This medicine may be used for other purposes; ask your health care provider or pharmacist if you have questions. COMMON BRAND NAME(S): Eloxatin What should I tell my health care provider before I take this medicine? They need to know if you have any of these conditions: -kidney disease -an unusual or allergic reaction to oxaliplatin, other chemotherapy, other medicines, foods, dyes, or preservatives -pregnant or trying to get pregnant -breast-feeding How should I use this medicine? This drug is given as an infusion into a vein. It is administered in a hospital or clinic by  a specially trained health care professional. Talk to your pediatrician regarding the use of this medicine in children. Special care may be needed. Overdosage: If you think you have taken too much of this medicine contact a poison control center or emergency room at once. NOTE: This medicine is only for you. Do not share this medicine with others. What if I miss a dose? It is important not to miss a dose. Call your doctor or health care professional if you are unable to keep an appointment. What may interact with this medicine? -medicines to increase blood counts like filgrastim, pegfilgrastim, sargramostim -probenecid -some antibiotics like amikacin, gentamicin, neomycin, polymyxin B, streptomycin, tobramycin -zalcitabine Talk to your doctor or health care professional before taking any of these medicines: -acetaminophen -aspirin -ibuprofen -ketoprofen -naproxen This list may not describe all possible interactions. Give your health care provider a list of all the medicines, herbs, non-prescription drugs, or dietary supplements you use. Also tell them if you smoke, drink alcohol, or use illegal drugs. Some items may interact with your medicine. What should I watch for while using this medicine? Your condition will be monitored carefully while you are receiving this medicine. You will need important blood work done while you are taking this medicine. This medicine can make you more sensitive to cold. Do not drink cold drinks or use ice. Cover exposed skin before coming in contact with cold temperatures or cold objects. When out in cold weather wear warm clothing and cover your mouth and nose to warm the air that goes into your lungs. Tell your doctor if you get sensitive to the cold. This drug may  make you feel generally unwell. This is not uncommon, as chemotherapy can affect healthy cells as well as cancer cells. Report any side effects. Continue your course of treatment even though you feel  ill unless your doctor tells you to stop. In some cases, you may be given additional medicines to help with side effects. Follow all directions for their use. Call your doctor or health care professional for advice if you get a fever, chills or sore throat, or other symptoms of a cold or flu. Do not treat yourself. This drug decreases your body's ability to fight infections. Try to avoid being around people who are sick. This medicine may increase your risk to bruise or bleed. Call your doctor or health care professional if you notice any unusual bleeding. Be careful brushing and flossing your teeth or using a toothpick because you may get an infection or bleed more easily. If you have any dental work done, tell your dentist you are receiving this medicine. Avoid taking products that contain aspirin, acetaminophen, ibuprofen, naproxen, or ketoprofen unless instructed by your doctor. These medicines may hide a fever. Do not become pregnant while taking this medicine. Women should inform their doctor if they wish to become pregnant or think they might be pregnant. There is a potential for serious side effects to an unborn child. Talk to your health care professional or pharmacist for more information. Do not breast-feed an infant while taking this medicine. Call your doctor or health care professional if you get diarrhea. Do not treat yourself. What side effects may I notice from receiving this medicine? Side effects that you should report to your doctor or health care professional as soon as possible: -allergic reactions like skin rash, itching or hives, swelling of the face, lips, or tongue -low blood counts - This drug may decrease the number of white blood cells, red blood cells and platelets. You may be at increased risk for infections and bleeding. -signs of infection - fever or chills, cough, sore throat, pain or difficulty passing urine -signs of decreased platelets or bleeding - bruising,  pinpoint red spots on the skin, black, tarry stools, nosebleeds -signs of decreased red blood cells - unusually weak or tired, fainting spells, lightheadedness -breathing problems -chest pain, pressure -cough -diarrhea -jaw tightness -mouth sores -nausea and vomiting -pain, swelling, redness or irritation at the injection site -pain, tingling, numbness in the hands or feet -problems with balance, talking, walking -redness, blistering, peeling or loosening of the skin, including inside the mouth -trouble passing urine or change in the amount of urine Side effects that usually do not require medical attention (report to your doctor or health care professional if they continue or are bothersome): -changes in vision -constipation -hair loss -loss of appetite -metallic taste in the mouth or changes in taste -stomach pain This list may not describe all possible side effects. Call your doctor for medical advice about side effects. You may report side effects to FDA at 1-800-FDA-1088. Where should I keep my medicine? This drug is given in a hospital or clinic and will not be stored at home. NOTE: This sheet is a summary. It may not cover all possible information. If you have questions about this medicine, talk to your doctor, pharmacist, or health care provider.  2018 Elsevier/Gold Standard (2008-05-18 17:22:47) Leucovorin injection What is this medicine? LEUCOVORIN (loo koe VOR in) is used to prevent or treat the harmful effects of some medicines. This medicine is used to treat anemia caused by a  low amount of folic acid in the body. It is also used with 5-fluorouracil (5-FU) to treat colon cancer. This medicine may be used for other purposes; ask your health care provider or pharmacist if you have questions. What should I tell my health care provider before I take this medicine? They need to know if you have any of these conditions: -anemia from low levels of vitamin B-12 in the blood -an  unusual or allergic reaction to leucovorin, folic acid, other medicines, foods, dyes, or preservatives -pregnant or trying to get pregnant -breast-feeding How should I use this medicine? This medicine is for injection into a muscle or into a vein. It is given by a health care professional in a hospital or clinic setting. Talk to your pediatrician regarding the use of this medicine in children. Special care may be needed. Overdosage: If you think you have taken too much of this medicine contact a poison control center or emergency room at once. NOTE: This medicine is only for you. Do not share this medicine with others. What if I miss a dose? This does not apply. What may interact with this medicine? -capecitabine -fluorouracil -phenobarbital -phenytoin -primidone -trimethoprim-sulfamethoxazole This list may not describe all possible interactions. Give your health care provider a list of all the medicines, herbs, non-prescription drugs, or dietary supplements you use. Also tell them if you smoke, drink alcohol, or use illegal drugs. Some items may interact with your medicine. What should I watch for while using this medicine? Your condition will be monitored carefully while you are receiving this medicine. This medicine may increase the side effects of 5-fluorouracil, 5-FU. Tell your doctor or health care professional if you have diarrhea or mouth sores that do not get better or that get worse. What side effects may I notice from receiving this medicine? Side effects that you should report to your doctor or health care professional as soon as possible: -allergic reactions like skin rash, itching or hives, swelling of the face, lips, or tongue -breathing problems -fever, infection -mouth sores -unusual bleeding or bruising -unusually weak or tired Side effects that usually do not require medical attention (report to your doctor or health care professional if they continue or are  bothersome): -constipation or diarrhea -loss of appetite -nausea, vomiting This list may not describe all possible side effects. Call your doctor for medical advice about side effects. You may report side effects to FDA at 1-800-FDA-1088. Where should I keep my medicine? This drug is given in a hospital or clinic and will not be stored at home. NOTE: This sheet is a summary. It may not cover all possible information. If you have questions about this medicine, talk to your doctor, pharmacist, or health care provider.  2018 Elsevier/Gold Standard (2008-04-27 16:50:29) Fluorouracil, 5-FU injection What is this medicine? FLUOROURACIL, 5-FU (flure oh YOOR a sil) is a chemotherapy drug. It slows the growth of cancer cells. This medicine is used to treat many types of cancer like breast cancer, colon or rectal cancer, pancreatic cancer, and stomach cancer. This medicine may be used for other purposes; ask your health care provider or pharmacist if you have questions. COMMON BRAND NAME(S): Adrucil What should I tell my health care provider before I take this medicine? They need to know if you have any of these conditions: -blood disorders -dihydropyrimidine dehydrogenase (DPD) deficiency -infection (especially a virus infection such as chickenpox, cold sores, or herpes) -kidney disease -liver disease -malnourished, poor nutrition -recent or ongoing radiation therapy -an unusual  or allergic reaction to fluorouracil, other chemotherapy, other medicines, foods, dyes, or preservatives -pregnant or trying to get pregnant -breast-feeding How should I use this medicine? This drug is given as an infusion or injection into a vein. It is administered in a hospital or clinic by a specially trained health care professional. Talk to your pediatrician regarding the use of this medicine in children. Special care may be needed. Overdosage: If you think you have taken too much of this medicine contact a poison  control center or emergency room at once. NOTE: This medicine is only for you. Do not share this medicine with others. What if I miss a dose? It is important not to miss your dose. Call your doctor or health care professional if you are unable to keep an appointment. What may interact with this medicine? -allopurinol -cimetidine -dapsone -digoxin -hydroxyurea -leucovorin -levamisole -medicines for seizures like ethotoin, fosphenytoin, phenytoin -medicines to increase blood counts like filgrastim, pegfilgrastim, sargramostim -medicines that treat or prevent blood clots like warfarin, enoxaparin, and dalteparin -methotrexate -metronidazole -pyrimethamine -some other chemotherapy drugs like busulfan, cisplatin, estramustine, vinblastine -trimethoprim -trimetrexate -vaccines Talk to your doctor or health care professional before taking any of these medicines: -acetaminophen -aspirin -ibuprofen -ketoprofen -naproxen This list may not describe all possible interactions. Give your health care provider a list of all the medicines, herbs, non-prescription drugs, or dietary supplements you use. Also tell them if you smoke, drink alcohol, or use illegal drugs. Some items may interact with your medicine. What should I watch for while using this medicine? Visit your doctor for checks on your progress. This drug may make you feel generally unwell. This is not uncommon, as chemotherapy can affect healthy cells as well as cancer cells. Report any side effects. Continue your course of treatment even though you feel ill unless your doctor tells you to stop. In some cases, you may be given additional medicines to help with side effects. Follow all directions for their use. Call your doctor or health care professional for advice if you get a fever, chills or sore throat, or other symptoms of a cold or flu. Do not treat yourself. This drug decreases your body's ability to fight infections. Try to avoid  being around people who are sick. This medicine may increase your risk to bruise or bleed. Call your doctor or health care professional if you notice any unusual bleeding. Be careful brushing and flossing your teeth or using a toothpick because you may get an infection or bleed more easily. If you have any dental work done, tell your dentist you are receiving this medicine. Avoid taking products that contain aspirin, acetaminophen, ibuprofen, naproxen, or ketoprofen unless instructed by your doctor. These medicines may hide a fever. Do not become pregnant while taking this medicine. Women should inform their doctor if they wish to become pregnant or think they might be pregnant. There is a potential for serious side effects to an unborn child. Talk to your health care professional or pharmacist for more information. Do not breast-feed an infant while taking this medicine. Men should inform their doctor if they wish to father a child. This medicine may lower sperm counts. Do not treat diarrhea with over the counter products. Contact your doctor if you have diarrhea that lasts more than 2 days or if it is severe and watery. This medicine can make you more sensitive to the sun. Keep out of the sun. If you cannot avoid being in the sun, wear protective clothing and use  sunscreen. Do not use sun lamps or tanning beds/booths. What side effects may I notice from receiving this medicine? Side effects that you should report to your doctor or health care professional as soon as possible: -allergic reactions like skin rash, itching or hives, swelling of the face, lips, or tongue -low blood counts - this medicine may decrease the number of white blood cells, red blood cells and platelets. You may be at increased risk for infections and bleeding. -signs of infection - fever or chills, cough, sore throat, pain or difficulty passing urine -signs of decreased platelets or bleeding - bruising, pinpoint red spots on the  skin, black, tarry stools, blood in the urine -signs of decreased red blood cells - unusually weak or tired, fainting spells, lightheadedness -breathing problems -changes in vision -chest pain -mouth sores -nausea and vomiting -pain, swelling, redness at site where injected -pain, tingling, numbness in the hands or feet -redness, swelling, or sores on hands or feet -stomach pain -unusual bleeding Side effects that usually do not require medical attention (report to your doctor or health care professional if they continue or are bothersome): -changes in finger or toe nails -diarrhea -dry or itchy skin -hair loss -headache -loss of appetite -sensitivity of eyes to the light -stomach upset -unusually teary eyes This list may not describe all possible side effects. Call your doctor for medical advice about side effects. You may report side effects to FDA at 1-800-FDA-1088. Where should I keep my medicine? This drug is given in a hospital or clinic and will not be stored at home. NOTE: This sheet is a summary. It may not cover all possible information. If you have questions about this medicine, talk to your doctor, pharmacist, or health care provider.  2018 Elsevier/Gold Standard (2008-02-25 13:53:16)

## 2017-08-14 NOTE — Patient Instructions (Signed)
Implanted Port Home Guide An implanted port is a type of central line that is placed under the skin. Central lines are used to provide IV access when treatment or nutrition needs to be given through a person's veins. Implanted ports are used for long-term IV access. An implanted port may be placed because:  You need IV medicine that would be irritating to the small veins in your hands or arms.  You need long-term IV medicines, such as antibiotics.  You need IV nutrition for a long period.  You need frequent blood draws for lab tests.  You need dialysis.  Implanted ports are usually placed in the chest area, but they can also be placed in the upper arm, the abdomen, or the leg. An implanted port has two main parts:  Reservoir. The reservoir is round and will appear as a small, raised area under your skin. The reservoir is the part where a needle is inserted to give medicines or draw blood.  Catheter. The catheter is a thin, flexible tube that extends from the reservoir. The catheter is placed into a large vein. Medicine that is inserted into the reservoir goes into the catheter and then into the vein.  How will I care for my incision site? Do not get the incision site wet. Bathe or shower as directed by your health care provider. How is my port accessed? Special steps must be taken to access the port:  Before the port is accessed, a numbing cream can be placed on the skin. This helps numb the skin over the port site.  Your health care provider uses a sterile technique to access the port. ? Your health care provider must put on a mask and sterile gloves. ? The skin over your port is cleaned carefully with an antiseptic and allowed to dry. ? The port is gently pinched between sterile gloves, and a needle is inserted into the port.  Only "non-coring" port needles should be used to access the port. Once the port is accessed, a blood return should be checked. This helps ensure that the port  is in the vein and is not clogged.  If your port needs to remain accessed for a constant infusion, a clear (transparent) bandage will be placed over the needle site. The bandage and needle will need to be changed every week, or as directed by your health care provider.  Keep the bandage covering the needle clean and dry. Do not get it wet. Follow your health care provider's instructions on how to take a shower or bath while the port is accessed.  If your port does not need to stay accessed, no bandage is needed over the port.  What is flushing? Flushing helps keep the port from getting clogged. Follow your health care provider's instructions on how and when to flush the port. Ports are usually flushed with saline solution or a medicine called heparin. The need for flushing will depend on how the port is used.  If the port is used for intermittent medicines or blood draws, the port will need to be flushed: ? After medicines have been given. ? After blood has been drawn. ? As part of routine maintenance.  If a constant infusion is running, the port may not need to be flushed.  How long will my port stay implanted? The port can stay in for as long as your health care provider thinks it is needed. When it is time for the port to come out, surgery will be   done to remove it. The procedure is similar to the one performed when the port was put in. When should I seek immediate medical care? When you have an implanted port, you should seek immediate medical care if:  You notice a bad smell coming from the incision site.  You have swelling, redness, or drainage at the incision site.  You have more swelling or pain at the port site or the surrounding area.  You have a fever that is not controlled with medicine.  This information is not intended to replace advice given to you by your health care provider. Make sure you discuss any questions you have with your health care provider. Document  Released: 10/22/2005 Document Revised: 03/29/2016 Document Reviewed: 06/29/2013 Elsevier Interactive Patient Education  2017 Elsevier Inc.  

## 2017-08-14 NOTE — Progress Notes (Signed)
[place] OFFICE PROGRESS NOTE   Diagnosis: Colon cancer  INTERVAL HISTORY:   [name] returns as scheduled. He completed another cycle of FOLFOX 07/31/2017. He tolerated the chemotherapy well. No nausea, diarrhea, or neuropathy symptoms. He reports 1 mouth ulcer resolved in a day when he took Biotene. He continues Coumadin anticoagulation.  Objective:  Vital signs in last 24 hours:  Blood pressure (!) 109/58, pulse 70, temperature 97.9 F (36.6 C), temperature source Oral, height 6' 2"  (1.88 m), weight 280 lb 12.8 oz (127.4 kg), SpO2 99 %.    HEENT: No thrush or ulcers Resp: Lungs clear bilaterally Cardio: Irregular GI: No hepatosplenomegaly, nontender Vascular: Trace edema at the low leg bilaterally  Skin: Multiple abrasions at the lower legs   Portacath/PICC-without erythema  Lab Results:  Lab Results  Component Value Date   WBC 3.5 (L) 08/14/2017   HGB 10.0 (L) 08/14/2017   HCT 31.4 (L) 08/14/2017   MCV 78.0 (L) 08/14/2017   PLT 141 08/14/2017   NEUTROABS 1.7 08/14/2017    CMP     Component Value Date/Time   NA 140 07/31/2017 0846   K 3.9 07/31/2017 0846   CL 108 07/02/2017 1144   CO2 25 07/31/2017 0846   GLUCOSE 133 07/31/2017 0846   BUN 11.0 07/31/2017 0846   CREATININE 0.8 07/31/2017 0846   CALCIUM 8.8 07/31/2017 0846   PROT 6.2 (L) 07/31/2017 0846   ALBUMIN 2.9 (L) 07/31/2017 0846   AST 18 07/31/2017 0846   ALT 11 07/31/2017 0846   ALKPHOS 105 07/31/2017 0846   BILITOT 0.73 07/31/2017 0846   GFRNONAA >60 07/02/2017 1144   GFRAA >60 07/02/2017 1144      Lab Results  Component Value Date   INR 4.00 (H) 08/14/2017    Medications: I have reviewed the patient's current medications.  Assessment/Plan: 1. Colon cancer-synchronous transverse and ascending colon masses, and multiple polyps noted on colonoscopy 04/25/2017 ? 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 Staging CT 04/25/2017 with synchronous masses at the cecum and transverse colon with evidence of locally metastatic lymph node/direct extension of tumor ? 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 Cycle 1 FOLFOX 07/03/2017 ? Cycle 2 FOLFOX 07/17/2017 (oxaliplatin held due to mild neutropenia) ? Cycle 3 FOLFOX 07/31/2017 ? Cycle 4 FOLFOX 08/14/2017 (Neulasta added) disease  2. Multiple polyps noted in the descending, sigmoid, and transverse colon on colonoscopy 04/25/2017-biopsy of a transverse colon polyp revealed low and high-grade dysplasia, other polyps returned as hyperplastic polyps and fragments of a tubular adenoma  3. History of iron deficiency anemia secondary to #1  4. Anxiety  5. Hypertension  6. Glaucoma  7. Atrial fibrillation-maintained on Coumadin  8. Multiple low-density pancreas lesions noted on CT 04/25/2017-likely benign    Disposition:  [name] has completed 3 cycles of adjuvant therapy. The plan is to proceed with cycle 4 FOLFOX today. The white count is borderline low. He will receive Neulasta with this cycle. We reviewed the potential toxicities associated with Neulasta and he agrees to proceed.  The PT/INR is supratherapeutic. He will hold Coumadin today and return for a PT/INR on 08/16/2017. We will adjust the Coumadin dose based on the PT/INR 08/16/2017.  [name] will return for an office visit and chemotherapy in 2 weeks.  25 minutes were spent with the patient today.  The majority of the time was used for counseling and coordination of care.  Donneta Romberg, MD  08/14/2017  11:02 AM

## 2017-08-14 NOTE — Telephone Encounter (Signed)
Left voicemail for patient regarding appt that was added per 10/10 sch msg

## 2017-08-15 ENCOUNTER — Telehealth: Payer: Self-pay | Admitting: *Deleted

## 2017-08-15 NOTE — Telephone Encounter (Signed)
Message from pt requesting pump DC appt to be changed to 1PM. His pump was connected at 3:15. Left message informing pt appt will be changed. Come in at 1:15.

## 2017-08-16 ENCOUNTER — Other Ambulatory Visit: Payer: Self-pay

## 2017-08-16 ENCOUNTER — Telehealth: Payer: Self-pay | Admitting: Oncology

## 2017-08-16 ENCOUNTER — Other Ambulatory Visit (HOSPITAL_BASED_OUTPATIENT_CLINIC_OR_DEPARTMENT_OTHER): Payer: Medicare HMO

## 2017-08-16 ENCOUNTER — Ambulatory Visit (HOSPITAL_BASED_OUTPATIENT_CLINIC_OR_DEPARTMENT_OTHER): Payer: Medicare HMO

## 2017-08-16 ENCOUNTER — Telehealth: Payer: Self-pay

## 2017-08-16 VITALS — BP 136/72 | HR 84 | Temp 97.6°F | Resp 18

## 2017-08-16 DIAGNOSIS — C184 Malignant neoplasm of transverse colon: Secondary | ICD-10-CM | POA: Diagnosis not present

## 2017-08-16 DIAGNOSIS — Z7901 Long term (current) use of anticoagulants: Secondary | ICD-10-CM | POA: Diagnosis not present

## 2017-08-16 DIAGNOSIS — I4891 Unspecified atrial fibrillation: Secondary | ICD-10-CM

## 2017-08-16 DIAGNOSIS — C189 Malignant neoplasm of colon, unspecified: Secondary | ICD-10-CM

## 2017-08-16 DIAGNOSIS — Z95828 Presence of other vascular implants and grafts: Secondary | ICD-10-CM

## 2017-08-16 DIAGNOSIS — Z452 Encounter for adjustment and management of vascular access device: Secondary | ICD-10-CM | POA: Diagnosis not present

## 2017-08-16 DIAGNOSIS — Z5189 Encounter for other specified aftercare: Secondary | ICD-10-CM

## 2017-08-16 DIAGNOSIS — C182 Malignant neoplasm of ascending colon: Secondary | ICD-10-CM

## 2017-08-16 DIAGNOSIS — C188 Malignant neoplasm of overlapping sites of colon: Secondary | ICD-10-CM

## 2017-08-16 LAB — PROTIME-INR
INR: 3.9 — AB (ref 2.00–3.50)
PROTIME: 46.8 s — AB (ref 10.6–13.4)

## 2017-08-16 MED ORDER — HEPARIN SOD (PORK) LOCK FLUSH 100 UNIT/ML IV SOLN
500.0000 [IU] | Freq: Once | INTRAVENOUS | Status: AC | PRN
  Administered 2017-08-16: 500 [IU]
  Filled 2017-08-16: qty 5

## 2017-08-16 MED ORDER — PEGFILGRASTIM INJECTION 6 MG/0.6ML ~~LOC~~
6.0000 mg | PREFILLED_SYRINGE | Freq: Once | SUBCUTANEOUS | Status: AC
  Administered 2017-08-16: 6 mg via SUBCUTANEOUS
  Filled 2017-08-16: qty 0.6

## 2017-08-16 MED ORDER — SODIUM CHLORIDE 0.9% FLUSH
10.0000 mL | INTRAVENOUS | Status: DC | PRN
  Administered 2017-08-16: 10 mL
  Filled 2017-08-16: qty 10

## 2017-08-16 NOTE — Telephone Encounter (Signed)
Scheduled appt per 10/12 sch message - left message with appt date and time.

## 2017-08-16 NOTE — Telephone Encounter (Signed)
VM left for patient with instructions per MD Benay Spice on when to take his coumadin.

## 2017-08-19 ENCOUNTER — Telehealth: Payer: Self-pay | Admitting: Nurse Practitioner

## 2017-08-19 NOTE — Telephone Encounter (Signed)
Scheduled appt per 10/10 los - patient to get an updated schedule next scheduled appt.

## 2017-08-20 DIAGNOSIS — C184 Malignant neoplasm of transverse colon: Secondary | ICD-10-CM | POA: Diagnosis not present

## 2017-08-20 DIAGNOSIS — K635 Polyp of colon: Secondary | ICD-10-CM | POA: Diagnosis not present

## 2017-08-20 DIAGNOSIS — K317 Polyp of stomach and duodenum: Secondary | ICD-10-CM | POA: Diagnosis not present

## 2017-08-20 DIAGNOSIS — D509 Iron deficiency anemia, unspecified: Secondary | ICD-10-CM | POA: Diagnosis not present

## 2017-08-23 ENCOUNTER — Other Ambulatory Visit: Payer: Self-pay | Admitting: Oncology

## 2017-08-23 ENCOUNTER — Other Ambulatory Visit (HOSPITAL_BASED_OUTPATIENT_CLINIC_OR_DEPARTMENT_OTHER): Payer: Medicare HMO

## 2017-08-23 ENCOUNTER — Telehealth: Payer: Self-pay | Admitting: Emergency Medicine

## 2017-08-23 DIAGNOSIS — Z7901 Long term (current) use of anticoagulants: Secondary | ICD-10-CM

## 2017-08-23 DIAGNOSIS — C189 Malignant neoplasm of colon, unspecified: Secondary | ICD-10-CM

## 2017-08-23 DIAGNOSIS — I4891 Unspecified atrial fibrillation: Secondary | ICD-10-CM | POA: Diagnosis not present

## 2017-08-23 LAB — PROTIME-INR
INR: 1.5 — ABNORMAL LOW (ref 2.00–3.50)
Protime: 18 Seconds — ABNORMAL HIGH (ref 10.6–13.4)

## 2017-08-23 NOTE — Telephone Encounter (Signed)
Called pt per Ned Card request to see how much coumadin patient is taking. Pt did not answer. vm left for patient to call back.

## 2017-08-26 ENCOUNTER — Telehealth: Payer: Self-pay | Admitting: *Deleted

## 2017-08-26 NOTE — Telephone Encounter (Signed)
Telephone call to patient to verify coumadin dose is 2.5 mg every other day. Asked patient to return call.

## 2017-08-27 ENCOUNTER — Telehealth: Payer: Self-pay | Admitting: *Deleted

## 2017-08-27 NOTE — Telephone Encounter (Signed)
Left message for pt to call office re: Coumadin dose.

## 2017-08-27 NOTE — Telephone Encounter (Signed)
-----   Message from Ladell Pier, MD sent at 08/25/2017  2:02 PM EDT ----- Please call patient Resume coumadin 2.5 mg daily, plan  To decrease to 2mg  daily start 10/24

## 2017-08-27 NOTE — Telephone Encounter (Signed)
Spoke with patient. Verified existing coumadin dose of 1/2 tab every other day. Per DR - dose is changed to 1/2 tab every day. Patient voiced understanding.

## 2017-08-28 ENCOUNTER — Ambulatory Visit (HOSPITAL_BASED_OUTPATIENT_CLINIC_OR_DEPARTMENT_OTHER): Payer: Medicare HMO

## 2017-08-28 ENCOUNTER — Telehealth: Payer: Self-pay | Admitting: Nurse Practitioner

## 2017-08-28 ENCOUNTER — Ambulatory Visit (HOSPITAL_BASED_OUTPATIENT_CLINIC_OR_DEPARTMENT_OTHER): Payer: Medicare HMO | Admitting: Nurse Practitioner

## 2017-08-28 ENCOUNTER — Other Ambulatory Visit (HOSPITAL_BASED_OUTPATIENT_CLINIC_OR_DEPARTMENT_OTHER): Payer: Medicare HMO

## 2017-08-28 ENCOUNTER — Ambulatory Visit: Payer: Medicare HMO

## 2017-08-28 VITALS — BP 145/70 | HR 97 | Temp 97.8°F | Resp 20 | Ht 74.0 in

## 2017-08-28 DIAGNOSIS — C184 Malignant neoplasm of transverse colon: Secondary | ICD-10-CM

## 2017-08-28 DIAGNOSIS — C180 Malignant neoplasm of cecum: Secondary | ICD-10-CM

## 2017-08-28 DIAGNOSIS — Z5111 Encounter for antineoplastic chemotherapy: Secondary | ICD-10-CM | POA: Diagnosis not present

## 2017-08-28 DIAGNOSIS — Z7901 Long term (current) use of anticoagulants: Secondary | ICD-10-CM | POA: Diagnosis not present

## 2017-08-28 DIAGNOSIS — I4891 Unspecified atrial fibrillation: Secondary | ICD-10-CM | POA: Diagnosis not present

## 2017-08-28 DIAGNOSIS — C189 Malignant neoplasm of colon, unspecified: Secondary | ICD-10-CM

## 2017-08-28 DIAGNOSIS — C188 Malignant neoplasm of overlapping sites of colon: Secondary | ICD-10-CM

## 2017-08-28 LAB — CBC WITH DIFFERENTIAL/PLATELET
BASO%: 1.3 % (ref 0.0–2.0)
BASOS ABS: 0.1 10*3/uL (ref 0.0–0.1)
EOS ABS: 0.1 10*3/uL (ref 0.0–0.5)
EOS%: 1 % (ref 0.0–7.0)
HCT: 34.5 % — ABNORMAL LOW (ref 38.4–49.9)
HGB: 10.8 g/dL — ABNORMAL LOW (ref 13.0–17.1)
LYMPH%: 18.8 % (ref 14.0–49.0)
MCH: 24.7 pg — AB (ref 27.2–33.4)
MCHC: 31.4 g/dL — AB (ref 32.0–36.0)
MCV: 78.5 fL — AB (ref 79.3–98.0)
MONO#: 0.8 10*3/uL (ref 0.1–0.9)
MONO%: 11.6 % (ref 0.0–14.0)
NEUT#: 4.8 10*3/uL (ref 1.5–6.5)
NEUT%: 67.3 % (ref 39.0–75.0)
Platelets: 114 10*3/uL — ABNORMAL LOW (ref 140–400)
RBC: 4.39 10*6/uL (ref 4.20–5.82)
RDW: 21.3 % — ABNORMAL HIGH (ref 11.0–14.6)
WBC: 7.2 10*3/uL (ref 4.0–10.3)
lymph#: 1.4 10*3/uL (ref 0.9–3.3)

## 2017-08-28 LAB — COMPREHENSIVE METABOLIC PANEL
ALT: 24 U/L (ref 0–55)
AST: 30 U/L (ref 5–34)
Albumin: 2.9 g/dL — ABNORMAL LOW (ref 3.5–5.0)
Alkaline Phosphatase: 92 U/L (ref 40–150)
Anion Gap: 8 mEq/L (ref 3–11)
BUN: 9.1 mg/dL (ref 7.0–26.0)
CO2: 23 meq/L (ref 22–29)
Calcium: 8.5 mg/dL (ref 8.4–10.4)
Chloride: 108 mEq/L (ref 98–109)
Creatinine: 0.9 mg/dL (ref 0.7–1.3)
GLUCOSE: 112 mg/dL (ref 70–140)
POTASSIUM: 3.6 meq/L (ref 3.5–5.1)
SODIUM: 138 meq/L (ref 136–145)
Total Bilirubin: 0.69 mg/dL (ref 0.20–1.20)
Total Protein: 6.2 g/dL — ABNORMAL LOW (ref 6.4–8.3)

## 2017-08-28 LAB — PROTIME-INR
INR: 3.8 — AB (ref 2.00–3.50)
Protime: 45.6 Seconds — ABNORMAL HIGH (ref 10.6–13.4)

## 2017-08-28 MED ORDER — SODIUM CHLORIDE 0.9 % IV SOLN
2400.0000 mg/m2 | INTRAVENOUS | Status: DC
  Administered 2017-08-28: 6200 mg via INTRAVENOUS
  Filled 2017-08-28: qty 100

## 2017-08-28 MED ORDER — OXALIPLATIN CHEMO INJECTION 100 MG/20ML
86.0000 mg/m2 | Freq: Once | INTRAVENOUS | Status: AC
  Administered 2017-08-28: 225 mg via INTRAVENOUS
  Filled 2017-08-28: qty 45

## 2017-08-28 MED ORDER — DEXTROSE 5 % IV SOLN
Freq: Once | INTRAVENOUS | Status: AC
  Administered 2017-08-28: 12:00:00 via INTRAVENOUS

## 2017-08-28 MED ORDER — DEXAMETHASONE SODIUM PHOSPHATE 10 MG/ML IJ SOLN
10.0000 mg | Freq: Once | INTRAMUSCULAR | Status: AC
  Administered 2017-08-28: 10 mg via INTRAVENOUS

## 2017-08-28 MED ORDER — FLUOROURACIL CHEMO INJECTION 2.5 GM/50ML
400.0000 mg/m2 | Freq: Once | INTRAVENOUS | Status: AC
  Administered 2017-08-28: 1050 mg via INTRAVENOUS
  Filled 2017-08-28: qty 21

## 2017-08-28 MED ORDER — DEXAMETHASONE SODIUM PHOSPHATE 10 MG/ML IJ SOLN
INTRAMUSCULAR | Status: AC
  Filled 2017-08-28: qty 1

## 2017-08-28 MED ORDER — DEXTROSE 5 % IV SOLN
400.0000 mg/m2 | Freq: Once | INTRAVENOUS | Status: AC
  Administered 2017-08-28: 1036 mg via INTRAVENOUS
  Filled 2017-08-28: qty 51.8

## 2017-08-28 MED ORDER — SODIUM CHLORIDE 0.9% FLUSH
10.0000 mL | INTRAVENOUS | Status: DC | PRN
  Filled 2017-08-28: qty 10

## 2017-08-28 MED ORDER — PALONOSETRON HCL INJECTION 0.25 MG/5ML
0.2500 mg | Freq: Once | INTRAVENOUS | Status: AC
  Administered 2017-08-28: 0.25 mg via INTRAVENOUS

## 2017-08-28 MED ORDER — PALONOSETRON HCL INJECTION 0.25 MG/5ML
INTRAVENOUS | Status: AC
  Filled 2017-08-28: qty 5

## 2017-08-28 MED ORDER — HEPARIN SOD (PORK) LOCK FLUSH 100 UNIT/ML IV SOLN
500.0000 [IU] | Freq: Once | INTRAVENOUS | Status: DC | PRN
  Filled 2017-08-28: qty 5

## 2017-08-28 NOTE — Telephone Encounter (Signed)
Scheduled appt per 10/24 los - Gave patient AVS and calender per los.  

## 2017-08-28 NOTE — Progress Notes (Addendum)
  [place] OFFICE PROGRESS NOTE   Diagnosis:  Colon cancer  INTERVAL HISTORY:   Mr. Schwandt returns as scheduled. He completed cycle 4 FOLFOX 08/14/2017. He denies nausea. No mouth sores. No diarrhea. Cold sensitivity lasted about 7 days. No persistent neuropathy symptoms. No bone pain following Neulasta. No bleeding. He has noted a small bruise at the dorsal aspect of the right hand.  Objective:  Vital signs in last 24 hours:  Blood pressure (!) 145/70, pulse 97, temperature 97.8 F (36.6 C), temperature source Oral, resp. rate 20, height _0  (1.88 m), SpO2 98 %.    HEENT: no thrush or ulcers. Resp: lungs clear bilaterally. Cardio: irregular. GI: abdomen soft and nontender. No hepatomegaly. Vascular: trace edema at the lower legs bilaterally. Skin: multiple small scattered abrasions at the lower legs.  Portacath without erythema.  Lab Results:  Lab Results  Component Value Date   WBC 7.2 08/28/2017   HGB 10.8 (L) 08/28/2017   HCT 34.5 (L) 08/28/2017   MCV 78.5 (L) 08/28/2017   PLT 114 (L) 08/28/2017   NEUTROABS 4.8 08/28/2017   INR 3.8 Imaging:  No results found.  Medications: I have reviewed the patient's current medications.  Assessment/Plan: 1. Colon cancer-synchronous transverse and ascending colon masses, and multiple polyps noted on colonoscopy 04/25/2017 ? 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 Staging CT 04/25/2017 with synchronous masses at the cecum and transverse colon with evidence of locally metastatic lymph node/direct extension of tumor ? 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 Cycle 1 FOLFOX 07/03/2017 ? Cycle 2 FOLFOX 07/17/2017 (oxaliplatin held due to mild neutropenia) ? Cycle 3 FOLFOX  07/31/2017 ? Cycle 4 FOLFOX 08/14/2017 (Neulasta added)  ? Cycle 5 FOLFOX 08/28/2017  2. Multiple polyps noted in the descending, sigmoid, and transverse colon on colonoscopy 04/25/2017-biopsy of a transverse colon polyp revealed low and high-grade dysplasia, other polyps returned as hyperplastic polyps and fragments of a tubular adenoma  3. History of iron deficiency anemia secondary to #1  4. Anxiety  5. Hypertension  6. Glaucoma  7. Atrial fibrillation-maintained on Coumadin  8. Multiple low-density pancreas lesions noted on CT 04/25/2017-likely benign    Disposition: Mr. Tiemann appears stable. He has completed 4 cycles of FOLFOX. Plan to proceed with cycle 5 today as scheduled.   PT/INR is supratherapeutic. He will hold Coumadin today and tomorrow. We will repeat the PT/INR on 08/30/2017.  He will return for a follow-up visit and cycle FOLFOX in 2 weeks. He will contact the office in the interim with any problems.  Patient seen with Dr. Benay Spice. 25 minutes were spent face-to-face at today's visit with the majority of that time involved in counseling/coordination of care.  Averi, Cacioppo ANP/GNP-BC   08/28/2017  12:51 PM This was a shared visit with Ned Card. We discussed adjuvant treatment options with Mr. Goulette. The plan is to change to weekly 5-FU/leucovorin after he completed 6 cycles of FOLFOX.  The PT/INR is supratherapeutic again today. He will hold Coumadin and return for a PT/INR check on 08/30/2017. We will adjust the Coumadin dose on 08/30/2017 as indicated.  Julieanne Manson, M.D.

## 2017-08-30 ENCOUNTER — Telehealth: Payer: Self-pay

## 2017-08-30 ENCOUNTER — Other Ambulatory Visit (HOSPITAL_BASED_OUTPATIENT_CLINIC_OR_DEPARTMENT_OTHER): Payer: Medicare HMO

## 2017-08-30 ENCOUNTER — Other Ambulatory Visit: Payer: Self-pay | Admitting: Nurse Practitioner

## 2017-08-30 ENCOUNTER — Ambulatory Visit (HOSPITAL_BASED_OUTPATIENT_CLINIC_OR_DEPARTMENT_OTHER): Payer: Medicare HMO

## 2017-08-30 DIAGNOSIS — Z7901 Long term (current) use of anticoagulants: Secondary | ICD-10-CM | POA: Diagnosis not present

## 2017-08-30 DIAGNOSIS — I4891 Unspecified atrial fibrillation: Secondary | ICD-10-CM | POA: Diagnosis not present

## 2017-08-30 DIAGNOSIS — C189 Malignant neoplasm of colon, unspecified: Secondary | ICD-10-CM

## 2017-08-30 DIAGNOSIS — Z95828 Presence of other vascular implants and grafts: Secondary | ICD-10-CM

## 2017-08-30 DIAGNOSIS — Z452 Encounter for adjustment and management of vascular access device: Secondary | ICD-10-CM

## 2017-08-30 DIAGNOSIS — C180 Malignant neoplasm of cecum: Secondary | ICD-10-CM

## 2017-08-30 LAB — PROTIME-INR
INR: 3.2 (ref 2.00–3.50)
PROTIME: 38.4 s — AB (ref 10.6–13.4)

## 2017-08-30 MED ORDER — HEPARIN SOD (PORK) LOCK FLUSH 100 UNIT/ML IV SOLN
500.0000 [IU] | Freq: Once | INTRAVENOUS | Status: AC
  Administered 2017-08-30: 500 [IU]
  Filled 2017-08-30: qty 5

## 2017-08-30 MED ORDER — SODIUM CHLORIDE 0.9% FLUSH
10.0000 mL | Freq: Once | INTRAVENOUS | Status: AC
  Administered 2017-08-30: 10 mL
  Filled 2017-08-30: qty 10

## 2017-08-30 NOTE — Progress Notes (Signed)
Neulasta 6 mg given in left arm

## 2017-08-30 NOTE — Telephone Encounter (Signed)
VM left for patient instructing him to hold his coumadin today (10/26), then to resume taking the coumadin starting on 10/27 with 2.5 mg/day. Pt instructed to come back on 10/31 for a lab appointment and to expect a call from the scheduling department with appointment times.

## 2017-08-30 NOTE — Patient Instructions (Addendum)
Central Line, Adult A central line is a thin, flexible tube (catheter) that is put in your vein. It can be used to:  Take blood for lab tests.  Give you medicine.  Give you food and nutrients.  The procedure may vary among doctors and hospitals. Follow these instructions at home: Caring for the tube  Follow instructions from your doctor about: ? Flushing the tube with saline solution. ? Cleaning the tube and the area around it.  Only flush with clean (sterile) supplies. The supplies should be from your doctor, a pharmacy, or another place that your doctor recommends.  Before you flush the tube or clean the area around the tube: ? Wash your hands with soap and water. If you cannot use soap and water, use hand sanitizer. ? Clean the central line hub with rubbing alcohol. Caring for your skin  Keep the area where the tube was put in clean and dry.  Every day, and when changing the bandage, check the skin around the central line for: ? Redness, swelling, or pain. ? Fluid or blood. ? Warmth. ? Pus. ? A bad smell. General instructions  Keep the tube clamped, unless it is being used.  Keep your supplies in a clean, dry location.  If you or someone else accidentally pulls on the tube, make sure: ? The bandage (dressing) is okay. ? There is no bleeding. ? The tube has not been pulled out.  Do not use scissors or sharp objects near the tube.  Do not swim or let the tube soak in a tub.  Ask your doctor what activities are safe for you. Your doctor may tell you not to lift anything or move your arm too much.  Take over-the-counter and prescription medicines only as told by your doctor.  Change bandages as told by your doctor.  Keep your bandage dry. If a bandage gets wet, have it changed right away.  Keep all follow-up visits as told by your doctor. This is important. Throwing away supplies  Throw away any syringes in a trash (disposal) container that is only for sharp  items (sharps container). You can buy a sharps container from a pharmacy, or you can make one by using an empty hard plastic bottle with a cover.  Place any used bandages or infusion bags into a plastic bag. Throw that bag in the trash. Contact a doctor if:  You have any of these where the tube was put in: ? Redness, swelling, or pain. ? Fluid or blood. ? A warm feeling. ? Pus or a bad smell. Get help right away if:  You have: ? A fever. ? Chills. ? Trouble getting enough air (shortness of breath). ? Trouble breathing. ? Pain in your chest. ? Swelling in your neck, face, chest, or arm.  You are coughing.  You feel your heart beating fast or skipping beats.  You feel dizzy or you pass out (faint).  There are red lines coming from where the tube was put in.  The area where the tube was put in is bleeding and the bleeding will not stop.  Your tube is hard to flush.  You do not get a blood return from the tube.  The tube gets loose or comes out.  The tube has a hole or a tear.  The tube leaks. Summary  A central line is a thin, flexible tube (catheter) that is put in your vein. It can be used to take blood for lab tests or to   give you medicine.  Follow instructions from your doctor about flushing and cleaning the tube.  Keep the area where the tube was put in clean and dry.  Ask your doctor what activities are safe for you. This information is not intended to replace advice given to you by your health care provider. Make sure you discuss any questions you have with your health care provider. Document Released: 10/08/2012 Document Revised: 11/08/2016 Document Reviewed: 11/08/2016 Elsevier Interactive Patient Education  2017 Elsevier Inc.   Pegfilgrastim injection What is this medicine? PEGFILGRASTIM (PEG fil gra stim) is a long-acting granulocyte colony-stimulating factor that stimulates the growth of neutrophils, a type of white blood cell important in the body's  fight against infection. It is used to reduce the incidence of fever and infection in patients with certain types of cancer who are receiving chemotherapy that affects the bone marrow, and to increase survival after being exposed to high doses of radiation. This medicine may be used for other purposes; ask your health care provider or pharmacist if you have questions. COMMON BRAND NAME(S): Neulasta What should I tell my health care provider before I take this medicine? They need to know if you have any of these conditions: -kidney disease -latex allergy -ongoing radiation therapy -sickle cell disease -skin reactions to acrylic adhesives (On-Body Injector only) -an unusual or allergic reaction to pegfilgrastim, filgrastim, other medicines, foods, dyes, or preservatives -pregnant or trying to get pregnant -breast-feeding How should I use this medicine? This medicine is for injection under the skin. If you get this medicine at home, you will be taught how to prepare and give the pre-filled syringe or how to use the On-body Injector. Refer to the patient Instructions for Use for detailed instructions. Use exactly as directed. Tell your healthcare provider immediately if you suspect that the On-body Injector may not have performed as intended or if you suspect the use of the On-body Injector resulted in a missed or partial dose. It is important that you put your used needles and syringes in a special sharps container. Do not put them in a trash can. If you do not have a sharps container, call your pharmacist or healthcare provider to get one. Talk to your pediatrician regarding the use of this medicine in children. While this drug may be prescribed for selected conditions, precautions do apply. Overdosage: If you think you have taken too much of this medicine contact a poison control center or emergency room at once. NOTE: This medicine is only for you. Do not share this medicine with others. What if I  miss a dose? It is important not to miss your dose. Call your doctor or health care professional if you miss your dose. If you miss a dose due to an On-body Injector failure or leakage, a new dose should be administered as soon as possible using a single prefilled syringe for manual use. What may interact with this medicine? Interactions have not been studied. Give your health care provider a list of all the medicines, herbs, non-prescription drugs, or dietary supplements you use. Also tell them if you smoke, drink alcohol, or use illegal drugs. Some items may interact with your medicine. This list may not describe all possible interactions. Give your health care provider a list of all the medicines, herbs, non-prescription drugs, or dietary supplements you use. Also tell them if you smoke, drink alcohol, or use illegal drugs. Some items may interact with your medicine. What should I watch for while using this medicine?   You may need blood work done while you are taking this medicine. If you are going to need a MRI, CT scan, or other procedure, tell your doctor that you are using this medicine (On-Body Injector only). What side effects may I notice from receiving this medicine? Side effects that you should report to your doctor or health care professional as soon as possible: -allergic reactions like skin rash, itching or hives, swelling of the face, lips, or tongue -dizziness -fever -pain, redness, or irritation at site where injected -pinpoint red spots on the skin -red or dark-brown urine -shortness of breath or breathing problems -stomach or side pain, or pain at the shoulder -swelling -tiredness -trouble passing urine or change in the amount of urine Side effects that usually do not require medical attention (report to your doctor or health care professional if they continue or are bothersome): -bone pain -muscle pain This list may not describe all possible side effects. Call your doctor  for medical advice about side effects. You may report side effects to FDA at 1-800-FDA-1088. Where should I keep my medicine? Keep out of the reach of children. Store pre-filled syringes in a refrigerator between 2 and 8 degrees C (36 and 46 degrees F). Do not freeze. Keep in carton to protect from light. Throw away this medicine if it is left out of the refrigerator for more than 48 hours. Throw away any unused medicine after the expiration date. NOTE: This sheet is a summary. It may not cover all possible information. If you have questions about this medicine, talk to your doctor, pharmacist, or health care provider.  2018 Elsevier/Gold Standard (2016-10-18 12:58:03)  

## 2017-09-02 ENCOUNTER — Telehealth: Payer: Self-pay | Admitting: *Deleted

## 2017-09-02 ENCOUNTER — Telehealth: Payer: Self-pay | Admitting: Oncology

## 2017-09-02 MED ORDER — WARFARIN SODIUM 2 MG PO TABS: 2.0000 mg | ORAL_TABLET | Freq: Every day | ORAL | 0 refills | Status: DC

## 2017-09-02 NOTE — Telephone Encounter (Signed)
Call from pt reporting he will be out of town on 10/31 and will not be able to come to office for labs. Pt states he can have lab INR checked and call us with result.  Reviewed with Dr. Benay Spice: MD recommends checking INR before pt leaves town. Change Coumadin dose to 2mg  daily and repeat lab when he returns. Unable to reach pt, left message with above instructions. Requested he call back.

## 2017-09-02 NOTE — Telephone Encounter (Signed)
Left message for patient regarding appts that were added per 10/26 sch msg.

## 2017-09-02 NOTE — Telephone Encounter (Signed)
Pt returned call, left message stating he is already out of town. He can not come in for lab until next week.   Returned call to pt, he reports his friend can bring the warfarin 2mg  to him. Requested we send script to his regular pharmacy. Friend is a home Doctor, general practice and will bring a meter as well.  Pt stated he will decrease dose to 2mg  tonight. He's planning to check INR himself everyday.  Will make MD aware.

## 2017-09-03 ENCOUNTER — Telehealth: Payer: Self-pay

## 2017-09-03 NOTE — Telephone Encounter (Signed)
lvm on identified. Pt had asked about heparin on his bill, and he is taking warfarin. Explained that the heparin is for the port flush to keep it from clotting. Not an amount to affect blood thinning, and OK with the warfarin.

## 2017-09-04 ENCOUNTER — Other Ambulatory Visit: Payer: Medicare HMO

## 2017-09-04 ENCOUNTER — Telehealth: Payer: Self-pay | Admitting: *Deleted

## 2017-09-04 NOTE — Telephone Encounter (Signed)
Call from pt: He checked INR today with a (borrowed) home monitor. INR 2.9. He has been on Coumadin 2mg  for 2 days.  (2.5mg  on 10/27 and 10/28 after holding dose for 10/26.) Pt is requesting dosing instructions.

## 2017-09-04 NOTE — Telephone Encounter (Signed)
Left message on voicemail for pt to continue Coumadin 2mg . Check INR on 11/2 and call with result, per Dr. Benay Spice.

## 2017-09-06 ENCOUNTER — Telehealth: Payer: Self-pay

## 2017-09-06 NOTE — Telephone Encounter (Signed)
Pt lvm his INR this am was 2.2. Requesting warfarin instruction.  S/w Lisa lvm to continue current dose ( 2 mg daily)  and recheck on 11-5 and call with result.

## 2017-09-08 ENCOUNTER — Other Ambulatory Visit: Payer: Self-pay | Admitting: Oncology

## 2017-09-09 ENCOUNTER — Telehealth: Payer: Self-pay | Admitting: *Deleted

## 2017-09-09 MED ORDER — ONDANSETRON HCL 8 MG PO TABS: 8.0000 mg | ORAL_TABLET | Freq: Three times a day (TID) | ORAL | 1 refills | Status: AC | PRN

## 2017-09-09 NOTE — Telephone Encounter (Signed)
Reviewed call with Dr. Benay Spice: Order received for Zofran 8mg  Q8h PRN. Janne Lab with instructions, she will call in the AM with an update.

## 2017-09-09 NOTE — Telephone Encounter (Signed)
"  [name] [phone]. calling for advice on appetite help for [facility].  He ate one cup yogurt yesterday.  No vomiting, diarrhea, fever.  Weak, in bed feeling queasy.  Taking compazine every six hours.  Weighs himself daily and weighed 255 or 260 pounds at home yesterday.  Weighed 270 lbs when there October 24 th.tried ensure and boost but these make him queasy as well.  Hasn't felt well since last treatment, 08-28-2017.  Last BM yesterday loose, not diarrhea.    Routing call information to collaborative nurse and provider for review.  Further patient communication through collaborative nurse.

## 2017-09-10 ENCOUNTER — Telehealth: Payer: Self-pay | Admitting: *Deleted

## 2017-09-10 NOTE — Telephone Encounter (Signed)
Attempted to reach pt to follow up symptoms on 11/5. No answer.

## 2017-09-11 ENCOUNTER — Inpatient Hospital Stay (HOSPITAL_COMMUNITY): Payer: Medicare HMO

## 2017-09-11 ENCOUNTER — Other Ambulatory Visit: Payer: Self-pay

## 2017-09-11 ENCOUNTER — Other Ambulatory Visit: Payer: Self-pay | Admitting: Emergency Medicine

## 2017-09-11 ENCOUNTER — Ambulatory Visit: Payer: Medicare HMO

## 2017-09-11 ENCOUNTER — Other Ambulatory Visit (HOSPITAL_BASED_OUTPATIENT_CLINIC_OR_DEPARTMENT_OTHER): Payer: Medicare HMO

## 2017-09-11 ENCOUNTER — Ambulatory Visit (HOSPITAL_BASED_OUTPATIENT_CLINIC_OR_DEPARTMENT_OTHER): Payer: Medicare HMO | Admitting: Nurse Practitioner

## 2017-09-11 ENCOUNTER — Other Ambulatory Visit: Payer: Self-pay | Admitting: Medical

## 2017-09-11 ENCOUNTER — Ambulatory Visit (HOSPITAL_BASED_OUTPATIENT_CLINIC_OR_DEPARTMENT_OTHER): Payer: Medicare HMO

## 2017-09-11 ENCOUNTER — Encounter (HOSPITAL_COMMUNITY): Payer: Self-pay

## 2017-09-11 ENCOUNTER — Ambulatory Visit (HOSPITAL_BASED_OUTPATIENT_CLINIC_OR_DEPARTMENT_OTHER): Payer: Medicare HMO | Admitting: Medical

## 2017-09-11 ENCOUNTER — Other Ambulatory Visit (HOSPITAL_COMMUNITY)
Admission: RE | Admit: 2017-09-11 | Discharge: 2017-09-11 | Disposition: A | Discharge status: Home / Self Care | Payer: Medicare HMO | Source: Ambulatory Visit | Attending: Oncology | Admitting: Oncology

## 2017-09-11 ENCOUNTER — Encounter: Payer: Self-pay | Admitting: Nurse Practitioner

## 2017-09-11 ENCOUNTER — Inpatient Hospital Stay (HOSPITAL_COMMUNITY)
Admission: AD | Admit: 2017-09-11 | Discharge: 2017-09-13 | DRG: 394 | Disposition: A | Discharge status: Home / Self Care | Payer: Medicare HMO | Source: Ambulatory Visit | Attending: Internal Medicine | Admitting: Internal Medicine

## 2017-09-11 DIAGNOSIS — I481 Persistent atrial fibrillation: Secondary | ICD-10-CM | POA: Diagnosis not present

## 2017-09-11 DIAGNOSIS — Y92230 Patient room in hospital as the place of occurrence of the external cause: Secondary | ICD-10-CM | POA: Diagnosis present

## 2017-09-11 DIAGNOSIS — C184 Malignant neoplasm of transverse colon: Secondary | ICD-10-CM

## 2017-09-11 DIAGNOSIS — J189 Pneumonia, unspecified organism: Secondary | ICD-10-CM | POA: Diagnosis not present

## 2017-09-11 DIAGNOSIS — Z7901 Long term (current) use of anticoagulants: Secondary | ICD-10-CM

## 2017-09-11 DIAGNOSIS — R197 Diarrhea, unspecified: Secondary | ICD-10-CM | POA: Diagnosis not present

## 2017-09-11 DIAGNOSIS — D509 Iron deficiency anemia, unspecified: Secondary | ICD-10-CM | POA: Diagnosis present

## 2017-09-11 DIAGNOSIS — Z79899 Other long term (current) drug therapy: Secondary | ICD-10-CM

## 2017-09-11 DIAGNOSIS — E871 Hypo-osmolality and hyponatremia: Secondary | ICD-10-CM | POA: Diagnosis present

## 2017-09-11 DIAGNOSIS — C180 Malignant neoplasm of cecum: Secondary | ICD-10-CM

## 2017-09-11 DIAGNOSIS — D72829 Elevated white blood cell count, unspecified: Secondary | ICD-10-CM

## 2017-09-11 DIAGNOSIS — J90 Pleural effusion, not elsewhere classified: Secondary | ICD-10-CM | POA: Diagnosis not present

## 2017-09-11 DIAGNOSIS — C772 Secondary and unspecified malignant neoplasm of intra-abdominal lymph nodes: Secondary | ICD-10-CM | POA: Diagnosis not present

## 2017-09-11 DIAGNOSIS — R0600 Dyspnea, unspecified: Secondary | ICD-10-CM | POA: Diagnosis not present

## 2017-09-11 DIAGNOSIS — K1230 Oral mucositis (ulcerative), unspecified: Secondary | ICD-10-CM | POA: Diagnosis present

## 2017-09-11 DIAGNOSIS — C189 Malignant neoplasm of colon, unspecified: Secondary | ICD-10-CM | POA: Diagnosis present

## 2017-09-11 DIAGNOSIS — R5381 Other malaise: Secondary | ICD-10-CM | POA: Diagnosis not present

## 2017-09-11 DIAGNOSIS — F5089 Other specified eating disorder: Secondary | ICD-10-CM | POA: Diagnosis present

## 2017-09-11 DIAGNOSIS — J181 Lobar pneumonia, unspecified organism: Secondary | ICD-10-CM | POA: Diagnosis present

## 2017-09-11 DIAGNOSIS — R269 Unspecified abnormalities of gait and mobility: Secondary | ICD-10-CM | POA: Diagnosis not present

## 2017-09-11 DIAGNOSIS — B3782 Candidal enteritis: Secondary | ICD-10-CM | POA: Diagnosis not present

## 2017-09-11 DIAGNOSIS — R6520 Severe sepsis without septic shock: Secondary | ICD-10-CM | POA: Diagnosis not present

## 2017-09-11 DIAGNOSIS — N179 Acute kidney failure, unspecified: Secondary | ICD-10-CM | POA: Diagnosis present

## 2017-09-11 DIAGNOSIS — I509 Heart failure, unspecified: Secondary | ICD-10-CM | POA: Diagnosis present

## 2017-09-11 DIAGNOSIS — R6521 Severe sepsis with septic shock: Secondary | ICD-10-CM | POA: Diagnosis present

## 2017-09-11 DIAGNOSIS — F419 Anxiety disorder, unspecified: Secondary | ICD-10-CM | POA: Diagnosis present

## 2017-09-11 DIAGNOSIS — Y95 Nosocomial condition: Secondary | ICD-10-CM | POA: Diagnosis present

## 2017-09-11 DIAGNOSIS — I071 Rheumatic tricuspid insufficiency: Secondary | ICD-10-CM | POA: Diagnosis present

## 2017-09-11 DIAGNOSIS — E860 Dehydration: Secondary | ICD-10-CM

## 2017-09-11 DIAGNOSIS — A419 Sepsis, unspecified organism: Secondary | ICD-10-CM | POA: Diagnosis present

## 2017-09-11 DIAGNOSIS — I959 Hypotension, unspecified: Secondary | ICD-10-CM | POA: Diagnosis not present

## 2017-09-11 DIAGNOSIS — I4891 Unspecified atrial fibrillation: Secondary | ICD-10-CM

## 2017-09-11 DIAGNOSIS — D630 Anemia in neoplastic disease: Secondary | ICD-10-CM | POA: Diagnosis present

## 2017-09-11 DIAGNOSIS — K521 Toxic gastroenteritis and colitis: Principal | ICD-10-CM | POA: Diagnosis present

## 2017-09-11 DIAGNOSIS — J9601 Acute respiratory failure with hypoxia: Secondary | ICD-10-CM | POA: Diagnosis present

## 2017-09-11 DIAGNOSIS — J9811 Atelectasis: Secondary | ICD-10-CM | POA: Diagnosis not present

## 2017-09-11 DIAGNOSIS — I482 Chronic atrial fibrillation: Secondary | ICD-10-CM | POA: Diagnosis not present

## 2017-09-11 DIAGNOSIS — T451X5A Adverse effect of antineoplastic and immunosuppressive drugs, initial encounter: Secondary | ICD-10-CM | POA: Diagnosis present

## 2017-09-11 DIAGNOSIS — E46 Unspecified protein-calorie malnutrition: Secondary | ICD-10-CM | POA: Diagnosis present

## 2017-09-11 DIAGNOSIS — X58XXXA Exposure to other specified factors, initial encounter: Secondary | ICD-10-CM | POA: Diagnosis present

## 2017-09-11 DIAGNOSIS — B370 Candidal stomatitis: Secondary | ICD-10-CM | POA: Diagnosis present

## 2017-09-11 DIAGNOSIS — E785 Hyperlipidemia, unspecified: Secondary | ICD-10-CM | POA: Diagnosis present

## 2017-09-11 DIAGNOSIS — E876 Hypokalemia: Secondary | ICD-10-CM

## 2017-09-11 DIAGNOSIS — C186 Malignant neoplasm of descending colon: Secondary | ICD-10-CM | POA: Diagnosis not present

## 2017-09-11 DIAGNOSIS — G4733 Obstructive sleep apnea (adult) (pediatric): Secondary | ICD-10-CM | POA: Diagnosis present

## 2017-09-11 DIAGNOSIS — R9431 Abnormal electrocardiogram [ECG] [EKG]: Secondary | ICD-10-CM | POA: Diagnosis not present

## 2017-09-11 DIAGNOSIS — R404 Transient alteration of awareness: Secondary | ICD-10-CM | POA: Diagnosis not present

## 2017-09-11 DIAGNOSIS — I110 Hypertensive heart disease with heart failure: Secondary | ICD-10-CM | POA: Diagnosis present

## 2017-09-11 DIAGNOSIS — N19 Unspecified kidney failure: Secondary | ICD-10-CM

## 2017-09-11 DIAGNOSIS — J989 Respiratory disorder, unspecified: Secondary | ICD-10-CM | POA: Diagnosis not present

## 2017-09-11 DIAGNOSIS — I10 Essential (primary) hypertension: Secondary | ICD-10-CM

## 2017-09-11 DIAGNOSIS — R0602 Shortness of breath: Secondary | ICD-10-CM | POA: Diagnosis not present

## 2017-09-11 DIAGNOSIS — Z8719 Personal history of other diseases of the digestive system: Secondary | ICD-10-CM | POA: Diagnosis not present

## 2017-09-11 DIAGNOSIS — R0902 Hypoxemia: Secondary | ICD-10-CM | POA: Diagnosis not present

## 2017-09-11 DIAGNOSIS — Z6836 Body mass index (BMI) 36.0-36.9, adult: Secondary | ICD-10-CM | POA: Diagnosis not present

## 2017-09-11 DIAGNOSIS — R609 Edema, unspecified: Secondary | ICD-10-CM | POA: Diagnosis not present

## 2017-09-11 DIAGNOSIS — R0603 Acute respiratory distress: Secondary | ICD-10-CM | POA: Diagnosis not present

## 2017-09-11 DIAGNOSIS — R579 Shock, unspecified: Secondary | ICD-10-CM | POA: Diagnosis not present

## 2017-09-11 DIAGNOSIS — E669 Obesity, unspecified: Secondary | ICD-10-CM | POA: Diagnosis present

## 2017-09-11 DIAGNOSIS — R531 Weakness: Secondary | ICD-10-CM | POA: Diagnosis not present

## 2017-09-11 DIAGNOSIS — D638 Anemia in other chronic diseases classified elsewhere: Secondary | ICD-10-CM | POA: Diagnosis present

## 2017-09-11 DIAGNOSIS — Z9889 Other specified postprocedural states: Secondary | ICD-10-CM | POA: Diagnosis not present

## 2017-09-11 DIAGNOSIS — E872 Acidosis: Secondary | ICD-10-CM | POA: Diagnosis present

## 2017-09-11 DIAGNOSIS — R05 Cough: Secondary | ICD-10-CM | POA: Diagnosis not present

## 2017-09-11 LAB — CBC WITH DIFFERENTIAL/PLATELET
BASO%: 0.2 % (ref 0.0–2.0)
Basophils Absolute: 0.1 10*3/uL (ref 0.0–0.1)
EOS%: 0 % (ref 0.0–7.0)
Eosinophils Absolute: 0 10*3/uL (ref 0.0–0.5)
HCT: 40.9 % (ref 38.4–49.9)
HEMOGLOBIN: 13.6 g/dL (ref 13.0–17.1)
LYMPH#: 1.1 10*3/uL (ref 0.9–3.3)
LYMPH%: 4.7 % — ABNORMAL LOW (ref 14.0–49.0)
MCH: 25.4 pg — AB (ref 27.2–33.4)
MCHC: 33.3 g/dL (ref 32.0–36.0)
MCV: 76.4 fL — ABNORMAL LOW (ref 79.3–98.0)
MONO#: 3.8 10*3/uL — ABNORMAL HIGH (ref 0.1–0.9)
MONO%: 16.1 % — ABNORMAL HIGH (ref 0.0–14.0)
NEUT#: 18.6 10*3/uL — ABNORMAL HIGH (ref 1.5–6.5)
NEUT%: 79 % — AB (ref 39.0–75.0)
Platelets: 181 10*3/uL (ref 140–400)
RBC: 5.35 10*6/uL (ref 4.20–5.82)
RDW: 20.5 % — AB (ref 11.0–14.6)
WBC: 23.6 10*3/uL — AB (ref 4.0–10.3)
nRBC: 0 % (ref 0–0)

## 2017-09-11 LAB — URINALYSIS, ROUTINE W REFLEX MICROSCOPIC
BILIRUBIN URINE: NEGATIVE
Glucose, UA: NEGATIVE mg/dL
Hgb urine dipstick: NEGATIVE
KETONES UR: NEGATIVE mg/dL
Leukocytes, UA: NEGATIVE
Nitrite: NEGATIVE
PH: 5 (ref 5.0–8.0)
Protein, ur: 100 mg/dL — AB
SQUAMOUS EPITHELIAL / LPF: NONE SEEN
Specific Gravity, Urine: 1.025 (ref 1.005–1.030)

## 2017-09-11 LAB — COMPREHENSIVE METABOLIC PANEL
ALBUMIN: 2.5 g/dL — AB (ref 3.5–5.0)
ALK PHOS: 93 U/L (ref 40–150)
ALT: 27 U/L (ref 0–55)
ANION GAP: 14 meq/L — AB (ref 3–11)
AST: 24 U/L (ref 5–34)
BILIRUBIN TOTAL: 0.77 mg/dL (ref 0.20–1.20)
BUN: 27.1 mg/dL — ABNORMAL HIGH (ref 7.0–26.0)
CALCIUM: 8.8 mg/dL (ref 8.4–10.4)
CO2: 13 mEq/L — ABNORMAL LOW (ref 22–29)
Chloride: 104 mEq/L (ref 98–109)
Creatinine: 1.9 mg/dL — ABNORMAL HIGH (ref 0.7–1.3)
EGFR: 33 mL/min/{1.73_m2} — AB (ref >60)
GLUCOSE: 183 mg/dL — AB (ref 70–140)
Potassium: 2.7 mEq/L — CL (ref 3.5–5.1)
Sodium: 131 mEq/L — ABNORMAL LOW (ref 136–145)
Total Protein: 5.9 g/dL — ABNORMAL LOW (ref 6.4–8.3)

## 2017-09-11 LAB — PROTIME-INR
INR: 8.3 — AB
PROTHROMBIN TIME: 68.4 s — AB (ref 11.4–15.2)

## 2017-09-11 MED ORDER — ORAL CARE MOUTH RINSE
15.0000 mL | Freq: Two times a day (BID) | OROMUCOSAL | Status: DC
  Administered 2017-09-12: 15 mL via OROMUCOSAL

## 2017-09-11 MED ORDER — ACETAMINOPHEN 650 MG RE SUPP: 650.0000 mg | Freq: Four times a day (QID) | RECTAL | Status: DC | PRN

## 2017-09-11 MED ORDER — SODIUM CHLORIDE 0.9 % IV SOLN
Freq: Once | INTRAVENOUS | Status: AC
  Administered 2017-09-11: 12:00:00 via INTRAVENOUS
  Filled 2017-09-11: qty 1000

## 2017-09-11 MED ORDER — METOPROLOL TARTRATE 25 MG PO TABS
25.0000 mg | ORAL_TABLET | Freq: Two times a day (BID) | ORAL | Status: DC
  Administered 2017-09-11 – 2017-09-13 (×4): 25 mg via ORAL
  Filled 2017-09-11 (×4): qty 1

## 2017-09-11 MED ORDER — CHLORHEXIDINE GLUCONATE 0.12 % MT SOLN
15.0000 mL | Freq: Two times a day (BID) | OROMUCOSAL | Status: DC
  Administered 2017-09-11 – 2017-09-13 (×4): 15 mL via OROMUCOSAL
  Filled 2017-09-11 (×3): qty 15

## 2017-09-11 MED ORDER — ATORVASTATIN CALCIUM 40 MG PO TABS
80.0000 mg | ORAL_TABLET | Freq: Every evening | ORAL | Status: DC
  Filled 2017-09-11 (×2): qty 2

## 2017-09-11 MED ORDER — SODIUM CHLORIDE 0.9 % IV SOLN
INTRAVENOUS | Status: DC
  Administered 2017-09-11: 19:00:00 via INTRAVENOUS

## 2017-09-11 MED ORDER — ACETAMINOPHEN 325 MG PO TABS: 650.0000 mg | ORAL_TABLET | Freq: Four times a day (QID) | ORAL | Status: DC | PRN

## 2017-09-11 MED ORDER — DILTIAZEM HCL ER COATED BEADS 180 MG PO CP24
300.0000 mg | ORAL_CAPSULE | Freq: Every evening | ORAL | Status: DC
  Filled 2017-09-11 (×2): qty 1

## 2017-09-11 MED ORDER — ONDANSETRON HCL 4 MG PO TABS
8.0000 mg | ORAL_TABLET | Freq: Three times a day (TID) | ORAL | Status: DC | PRN
  Administered 2017-09-11 – 2017-09-12 (×2): 8 mg via ORAL
  Filled 2017-09-11 (×2): qty 2

## 2017-09-11 MED ORDER — ALPRAZOLAM 0.5 MG PO TABS
0.5000 mg | ORAL_TABLET | Freq: Three times a day (TID) | ORAL | Status: DC | PRN
  Administered 2017-09-11: 0.5 mg via ORAL
  Filled 2017-09-11: qty 1

## 2017-09-11 MED ORDER — POTASSIUM CHLORIDE CRYS ER 20 MEQ PO TBCR
40.0000 meq | EXTENDED_RELEASE_TABLET | Freq: Two times a day (BID) | ORAL | Status: DC
  Administered 2017-09-11 – 2017-09-13 (×4): 40 meq via ORAL
  Filled 2017-09-11 (×4): qty 2

## 2017-09-11 MED ORDER — SODIUM CHLORIDE 0.9% FLUSH
10.0000 mL | INTRAVENOUS | Status: DC | PRN
  Administered 2017-09-13: 10 mL
  Filled 2017-09-11: qty 40

## 2017-09-11 NOTE — H&P (Signed)
History and Physical    [name] DGL:[id number] DOB: Oct 15, 1946 DOA: 09/11/2017  PCP: [name], MD  Patient coming from: Home  Chief Complaint: Weakness, tiredness  HPI: [name] is a 71 y.o. male with medical history significant of colon cancer currently undergoing chemotherapy, htn, hld. When seen in Oncology clinic today, patient was noted to markedly weak and dehydrated. On further questioning, patient noted to have markedly poor PO intake over the past 1-2 weeks. In the office, patient was noted to be hyponatremic with Cr of 1.9 and BUN of over 20. WBC was noted to be over 23k. Patient was also found to be hypokalemic with K of 2.7.  Patient was given 49meq of K in the office and patient started on IVF hydration. Patient subsequently referred to [facility] for direct admission. On further questioning, pt denies coughing, sputum production, dysuria or fevers. No rashes or open sores  Review of Systems:  Review of Systems  Constitutional: Positive for malaise/fatigue. Negative for chills, fever and weight loss.  HENT: Negative for congestion, ear discharge, nosebleeds and tinnitus.   Eyes: Negative for double vision, photophobia and pain.  Respiratory: Negative for hemoptysis, sputum production and shortness of breath.   Cardiovascular: Negative for palpitations, orthopnea and claudication.  Gastrointestinal: Positive for diarrhea. Negative for abdominal pain, constipation, nausea and vomiting.  Genitourinary: Negative for dysuria, frequency and urgency.  Musculoskeletal: Negative for back pain, joint pain and neck pain.  Neurological: Positive for weakness. Negative for tremors, sensory change, seizures and loss of consciousness.  Psychiatric/Behavioral: Negative for hallucinations and memory loss. The patient is not nervous/anxious.     Past Medical History:  Diagnosis Date  . Anxiety   . Dysrhythmia     a fib  . Hyperlipidemia   . Hypertension    managed  . Pneumonia  1950    Past Surgical History:  Procedure Laterality Date  . APPENDECTOMY  1950  . CARDIOVERSION    . EYE SURGERY Bilateral    catract surgery     reports that  has never smoked. he has never used smokeless tobacco. He reports that he drinks alcohol. He reports that he does not use drugs.  No Known Allergies  Family History  Problem Relation Age of Onset  . Heart disease Father     Prior to Admission medications   Medication Sig Start Date End Date Taking? Authorizing Provider  ALPRAZolam Duanne Moron) 0.5 MG tablet Take 0.5 mg by mouth 3 (three) times daily as needed for anxiety.  05/01/13   [provider]  atorvastatin (LIPITOR) 80 MG tablet Take 80 mg by mouth every evening.  04/16/13   [provider]  diltiazem (CARDIZEM CD) 300 MG 24 hr capsule Take 300 mg by mouth every evening.     [provider]  dorzolamidel-timolol (COSOPT) 22.3-6.8 MG/ML SOLN ophthalmic solution Place 1 drop into both eyes 2 (two) times daily.    [provider]  lidocaine-prilocaine (EMLA) cream Apply to port site one hour prior to use. Do not rub in. Cover with plastic. 06/28/17   Ladell Pier, MD  lisinopril (PRINIVIL,ZESTRIL) 10 MG tablet Take 10 mg by mouth daily.  04/16/13   [provider]  metoprolol (LOPRESSOR) 50 MG tablet Take 25 mg by mouth 2 (two) times daily.     [provider]  nystatin (MYCOSTATIN) 100000 UNIT/ML suspension Take 5 mLs by mouth 4 (four) times daily as needed (for oral thrush).     [provider]  ondansetron (ZOFRAN) 8 MG tablet Take 1 tablet (8 mg total) every 8 (eight) hours as needed by mouth for nausea or vomiting. 09/09/17   Ladell Pier, MD  prochlorperazine (COMPAZINE) 10 MG tablet Take 1 tablet (10 mg total) by mouth every 6 (six) hours as needed for nausea or vomiting. Patient not taking: Reported on 07/31/2017 06/28/17   Ladell Pier, MD  traMADol (ULTRAM) 50 MG tablet Take 1-2 tablets (50-100 mg  total) by mouth every 6 (six) hours as needed for moderate pain or severe pain. Patient not taking: Reported on 07/17/2017 07/02/17   Stark Klein, MD  warfarin (COUMADIN) 2 MG tablet Take 1 tablet (2 mg total) by mouth daily. 09/02/17   Ladell Pier, MD    Physical Exam: There were no vitals filed for this visit.  Constitutional: NAD, calm, comfortable There were no vitals filed for this visit. Eyes: PERRL, lids and conjunctivae normal ENMT: Mucous membranes are dry. Posterior pharynx clear of any exudate or lesions.Normal dentition.  Neck: normal, supple, no masses, no thyromegaly Respiratory: clear to auscultation bilaterally, no wheezing, no crackles. Normal respiratory effort. No accessory muscle use.  Cardiovascular: Regular rate and rhythm, no murmurs / rubs / gallops. No extremity edema. 2+ pedal pulses. No carotid bruits.  Abdomen: no tenderness, no masses palpated. No hepatosplenomegaly. Bowel sounds positive.  Musculoskeletal: no clubbing / cyanosis. No joint deformity upper and lower extremities. Good ROM, no contractures. Normal muscle tone.  Skin: no rashes, lesions, ulcers. No induration Neurologic: CN 2-12 grossly intact. Sensation intact, DTR normal. Strength 5/5 in all 4.  Psychiatric: Normal judgment and insight. Alert and oriented x 3. Normal mood.    Labs on Admission: I have personally reviewed following labs and imaging studies  CBC: Recent Labs  Lab 09/11/17 0947  WBC 23.6*  NEUTROABS 18.6*  HGB 13.6  HCT 40.9  MCV 76.4*  PLT 734   Basic Metabolic Panel: Recent Labs  Lab 09/11/17 0949  NA 131*  K 2.7 Repeated and Verified*  CO2 13 Repeated and Verified*  GLUCOSE 183*  BUN 27.1*  CREATININE 1.9*  CALCIUM 8.8   GFR: CrCl cannot be calculated (Unknown ideal weight.). Liver Function Tests: Recent Labs  Lab 09/11/17 0949  AST 24  ALT 27  ALKPHOS 93  BILITOT 0.77  PROT 5.9*  ALBUMIN 2.5*   No results for input(s): LIPASE, AMYLASE in the  last 168 hours. No results for input(s): AMMONIA in the last 168 hours. Coagulation Profile: Recent Labs  Lab 09/11/17 0947 09/11/17 1010  INR Sent out for confirmation. See results in EPIC. 8.30*  PROTIME Sent out for confirmation. See results in EPIC.  --    Cardiac Enzymes: No results for input(s): CKTOTAL, CKMB, CKMBINDEX, TROPONINI in the last 168 hours. BNP (last 3 results) No results for input(s): PROBNP in the last 8760 hours. HbA1C: No results for input(s): HGBA1C in the last 72 hours. CBG: No results for input(s): GLUCAP in the last 168 hours. Lipid Profile: No results for input(s): CHOL, HDL, LDLCALC, TRIG, CHOLHDL, LDLDIRECT in the last 72 hours. Thyroid Function Tests: No results for input(s): TSH, T4TOTAL, FREET4, T3FREE, THYROIDAB in the last 72 hours. Anemia Panel: No results for input(s): VITAMINB12, FOLATE, FERRITIN, TIBC, IRON, RETICCTPCT in the last 72 hours. Urine analysis:    Component Value Date/Time   COLORURINE YELLOW 06/06/2017 1842   APPEARANCEUR CLEAR 06/06/2017 1842   LABSPEC >1.030 (H) 06/06/2017 1842   PHURINE 5.5 06/06/2017 1842  GLUCOSEU 100 (A) 06/06/2017 1842   HGBUR TRACE (A) 06/06/2017 1842   BILIRUBINUR NEGATIVE 06/06/2017 1842   KETONESUR 15 (A) 06/06/2017 1842   PROTEINUR NEGATIVE 06/06/2017 1842   NITRITE NEGATIVE 06/06/2017 1842   LEUKOCYTESUR TRACE (A) 06/06/2017 1842   Sepsis Labs: !!!!!!!!!!!!!!!!!!!!!!!!!!!!!!!!!!!!!!!!!!!! @LABRCNTIP (procalcitonin:4,lacticidven:4) )No results found for this or any previous visit (from the past 240 hour(s)).   Radiological Exams on Admission: No results found.   Assessment/Plan Principal Problem:   ARF (acute renal failure) (HCC) Active Problems:   Colon cancer (HCC)   HLD (hyperlipidemia)   Essential hypertension   Hyponatremia   Leukocytosis   1. ARF 1. Suspect secondary to dehydration secondary to anorexia 2. Continue patient on IVF hydration 3. Repeat BMET in AM 4. Avoid  nephrotoxic agents 2. Colon cancer 1. Patient followed by Dr. Benay Spice 2. Pt had been undergoing chemo prior to admission 3. HLD 1. Stable at this time 4. HTN 1. BP currently stable  2. Cont home regimen as tolerated 5. Hyponatremia 1. Likely secondary to dehydration 2. Continue IVF per above 3. Repeat bmet in AM 6. Leukocytosis 1. Unclear etiology 2. Will check CXR, UA, Blood cultures 3. Pt afebrile 4. Hold off on antimicrobial for now unless pt rules in for infection or becomes febrile 5. Repeat CBC in AM 7. Afib 1. Rate controlled 2. On coumadin prior to admit, will hold secondary to coumadin coagulopathy 3. Repeat INR in AM. No signs of active blood loss  DVT prophylaxis: SCD's Code Status: Full Family Communication: Pt in room  Disposition Plan: uncertain at this time  Consults called: Oncology, Dr. Benay Spice Admission status: Inpatient, will likely require greater than 2 midnight stay to correct dehydration and ARF   CHIU, Orpah Melter MD [facility] Pager [phone]  If 7PM-7AM, please contact night-coverage [email or web address] Password [email or web address]  09/11/2017, 4:02 PM

## 2017-09-11 NOTE — Progress Notes (Unsigned)
At 11:30 patients wife brought patient back to infusion and stated he couldn't stay sitting any longer. Assisted pt into bed. Patient to see Lattie Haw, NP today. Called office to let them know he was in infusion bed. Spoke with Lucianne Lei with symptom management and asked if he could come see him as well to see if we could get some fluids started. Patient vitals charted. Potassium was 2.7 and several other labs were not within normal limits. Made Lucianne Lei aware. Was given a verbal order for 1L NS with 20k to run at 250/hr for 4hrs. Patients wife stated patient has not ate well past 6 days. He has not experienced any fevers, diarrhea or constipation, but has had nausea. Dr. Benay Spice to come see pt while he is infusion.  @1315  Dr. Benay Spice came to inform pt he will be admitted to [facility] after patient has finished his current fluids. Patients wife at bedside. She has good understanding of his plan.   Cyndia Bent RN

## 2017-09-11 NOTE — Progress Notes (Unsigned)
INR critical value given to [facility] @1147 .

## 2017-09-11 NOTE — Patient Instructions (Signed)

## 2017-09-11 NOTE — Progress Notes (Addendum)
[facility] OFFICE PROGRESS NOTE   Diagnosis: Colon cancer  INTERVAL HISTORY:   [name] returns as scheduled.  He completed cycle 5 FOLFOX 08/28/2017.  He denies nausea/vomiting.  No mouth sores.  No diarrhea.  About 4-5 days ago he suddenly lost his appetite, developed an "aversion" to food and became weak.  He denies fever.  No shaking chills.  He has stable shortness of breath.  No chest pain.  No bleeding.  Wife notes he has been lethargic today.  Objective:  Vital signs in last 24 hours:  Blood pressure 91/54, heart rate 99, respirations 17    HEENT: Mouth appears dry.  White coating over tongue. Resp: Lungs clear bilaterally. Cardio: Irregular. GI: Abdomen soft and nontender.  No hepatomegaly. Vascular: Trace edema at the lower legs bilaterally. Skin: Multiple small scattered abrasions at the lower legs.  Decreased skin turgor. Neuro: Awake and talking.  He seems mildly confused. Port-A-Cath without erythema.   Lab Results:  Lab Results  Component Value Date   WBC 23.6 (H) 09/11/2017   HGB 13.6 09/11/2017   HCT 40.9 09/11/2017   MCV 76.4 (L) 09/11/2017   PLT 181 09/11/2017   NEUTROABS 18.6 (H) 09/11/2017    Imaging:  No results found.  Medications: I have reviewed the patient's current medications.  Assessment/Plan: 1. Colon cancer-synchronous transverse and ascending colon masses, and multiple polyps noted on colonoscopy 04/25/2017 ? 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 Staging CT 04/25/2017 with synchronous masses at the cecum and transverse colon with evidence of locally metastatic lymph node/direct extension of tumor ? 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 Cycle 1 FOLFOX 07/03/2017 ? Cycle 2 FOLFOX  07/17/2017 (oxaliplatin held due to mild neutropenia) ? Cycle 3 FOLFOX 07/31/2017 ? Cycle 4 FOLFOX 08/14/2017(Neulasta added)  ? Cycle 5 FOLFOX 08/28/2017  2. Multiple polyps noted in the descending, sigmoid, and transverse colon on colonoscopy 04/25/2017-biopsy of a transverse colon polyp revealed low and high-grade dysplasia, other polyps returned as hyperplastic polyps and fragments of a tubular adenoma  3. History of iron deficiency anemia secondary to #1  4. Anxiety  5. Hypertension  6. Glaucoma  7. Atrial fibrillation-maintained on Coumadin  8. Multiple low-density pancreas lesions noted on CT 04/25/2017-likely benign   Disposition: [name] has completed 5 cycles of FOLFOX.  He presents today for routine follow-up prior to cycle 6.  He is noted to be hypotensive and appears significantly dehydrated, etiology unclear. He denies nausea/vomiting and diarrhea.  The white count is elevated.  He did not receive Neulasta following the most recent cycle of chemotherapy.  He seems mildly confused. He is currently receiving IV fluids with potassium replacement.  Chemotherapy will be held.    He is maintained on Coumadin for atrial fibrillation.  The PT/INR is supratherapeutic.  He understands to hold Coumadin.    We have recommended hospitalization for further evaluation.  He is in agreement.  We will contact the [facility] hospitalist service.  Patient seen with Dr. Sherrill.  25 minutes were spent face-to-face at today's visit with the majority of that time involved in counseling/coordination of care.  [name], [name] ANP/GNP-BC   09/11/2017  12:56 PM  This was a shared visit with [name].  [name] was interviewed and examined.  He presents with failure to thrive following cycle 5   FOLFOX.  He is dehydrated.  It is unclear whether his symptoms are related to toxicity from chemotherapy or another etiology.  He does not have nausea/vomiting or diarrhea which  would be typical of chemotherapy-induced dehydration.  He began intravenous hydration at the Cancer center.  I contacted the hospitalist service and he will be admitted for further evaluation.  Recommend obtaining blood cultures and beginning broad-spectrum antibiotics until an infectious etiology is excluded.  Julieanne Manson, MD

## 2017-09-11 NOTE — Patient Instructions (Signed)
Dehydration, Adult Dehydration is when there is not enough fluid or water in your body. This happens when you lose more fluids than you take in. Dehydration can range from mild to very bad. It should be treated right away to keep it from getting very bad. Symptoms of mild dehydration may include:  Thirst.  Dry lips.  Slightly dry mouth.  Dry, warm skin.  Dizziness. Symptoms of moderate dehydration may include:  Very dry mouth.  Muscle cramps.  Dark pee (urine). Pee may be the color of tea.  Your body making less pee.  Your eyes making fewer tears.  Heartbeat that is uneven or faster than normal (palpitations).  Headache.  Light-headedness, especially when you stand up from sitting.  Fainting (syncope). Symptoms of very bad dehydration may include:  Changes in skin, such as: ? Cold and clammy skin. ? Blotchy (mottled) or pale skin. ? Skin that does not quickly return to normal after being lightly pinched and let go (poor skin turgor).  Changes in body fluids, such as: ? Feeling very thirsty. ? Your eyes making fewer tears. ? Not sweating when body temperature is high, such as in hot weather. ? Your body making very little pee.  Changes in vital signs, such as: ? Weak pulse. ? Pulse that is more than 100 beats a minute when you are sitting still. ? Fast breathing. ? Low blood pressure.  Other changes, such as: ? Sunken eyes. ? Cold hands and feet. ? Confusion. ? Lack of energy (lethargy). ? Trouble waking up from sleep. ? Short-term weight loss. ? Unconsciousness. Follow these instructions at home:  If told by your doctor, drink an ORS: ? Make an ORS by using instructions on the package. ? Start by drinking small amounts, about  cup (120 mL) every 5-10 minutes. ? Slowly drink more until you have had the amount that your doctor said to have.  Drink enough clear fluid to keep your pee clear or pale yellow. If you were told to drink an ORS, finish the ORS  first, then start slowly drinking clear fluids. Drink fluids such as: ? Water. Do not drink only water by itself. Doing that can make the salt (sodium) level in your body get too low (hyponatremia). ? Ice chips. ? Fruit juice that you have added water to (diluted). ? Low-calorie sports drinks.  Avoid: ? Alcohol. ? Drinks that have a lot of sugar. These include high-calorie sports drinks, fruit juice that does not have water added, and soda. ? Caffeine. ? Foods that are greasy or have a lot of fat or sugar.  Take over-the-counter and prescription medicines only as told by your doctor.  Do not take salt tablets. Doing that can make the salt level in your body get too high (hypernatremia).  Eat foods that have minerals (electrolytes). Examples include bananas, oranges, potatoes, tomatoes, and spinach.  Keep all follow-up visits as told by your doctor. This is important. Contact a doctor if:  You have belly (abdominal) pain that: ? Gets worse. ? Stays in one area (localizes).  You have a rash.  You have a stiff neck.  You get angry or annoyed more easily than normal (irritability).  You are more sleepy than normal.  You have a harder time waking up than normal.  You feel: ? Weak. ? Dizzy. ? Very thirsty.  You have peed (urinated) only a small amount of very dark pee during 6-8 hours. Get help right away if:  You have symptoms of   very bad dehydration.  You cannot drink fluids without throwing up (vomiting).  Your symptoms get worse with treatment.  You have a fever.  You have a very bad headache.  You are throwing up or having watery poop (diarrhea) and it: ? Gets worse. ? Does not go away.  You have blood or something green (bile) in your throw-up.  You have blood in your poop (stool). This may cause poop to look black and tarry.  You have not peed in 6-8 hours.  You pass out (faint).  Your heart rate when you are sitting still is more than 100 beats a  minute.  You have trouble breathing. This information is not intended to replace advice given to you by your health care provider. Make sure you discuss any questions you have with your health care provider. Document Released: 08/18/2009 Document Revised: 05/11/2016 Document Reviewed: 12/16/2015 Elsevier Interactive Patient Education  2018 [employer]   Hypokalemia Hypokalemia means that the amount of potassium in the blood is lower than normal.Potassium is a chemical that helps regulate the amount of fluid in the body (electrolyte). It also stimulates muscle tightening (contraction) and helps nerves work properly.Normally, most of the body's potassium is inside of cells, and only a very small amount is in the blood. Because the amount in the blood is so small, minor changes to potassium levels in the blood can be life-threatening. What are the causes? This condition may be caused by:  Antibiotic medicine.  Diarrhea or vomiting. Taking too much of a medicine that helps you have a bowel movement (laxative) can cause diarrhea and lead to hypokalemia.  Chronic kidney disease (CKD).  Medicines that help the body get rid of excess fluid (diuretics).  Eating disorders, such as bulimia.  Low magnesium levels in the body.  Sweating a lot.  What are the signs or symptoms? Symptoms of this condition include:  Weakness.  Constipation.  Fatigue.  Muscle cramps.  Mental confusion.  Skipped heartbeats or irregular heartbeat (palpitations).  Tingling or numbness.  How is this diagnosed? This condition is diagnosed with a blood test. How is this treated? Hypokalemia can be treated by taking potassium supplements by mouth or adjusting the medicines that you take. Treatment may also include eating more foods that contain a lot of potassium. If your potassium level is very low, you may need to get potassium through an IV tube in one of your veins and be monitored in the  hospital. Follow these instructions at home:  Take over-the-counter and prescription medicines only as told by your health care provider. This includes vitamins and supplements.  Eat a healthy diet. A healthy diet includes fresh fruits and vegetables, whole grains, healthy fats, and lean proteins.  If instructed, eat more foods that contain a lot of potassium, such as: ? Nuts, such as peanuts and pistachios. ? Seeds, such as sunflower seeds and pumpkin seeds. ? Peas, lentils, and lima beans. ? Whole grain and bran cereals and breads. ? Fresh fruits and vegetables, such as apricots, avocado, bananas, cantaloupe, kiwi, oranges, tomatoes, asparagus, and potatoes. ? Orange juice. ? Tomato juice. ? Red meats. ? Yogurt.  Keep all follow-up visits as told by your health care provider. This is important. Contact a health care provider if:  You have weakness that gets worse.  You feel your heart pounding or racing.  You vomit.  You have diarrhea.  You have diabetes (diabetes mellitus) and you have trouble keeping your blood sugar (glucose) in your   target range. Get help right away if:  You have chest pain.  You have shortness of breath.  You have vomiting or diarrhea that lasts for more than 2 days.  You faint. This information is not intended to replace advice given to you by your health care provider. Make sure you discuss any questions you have with your health care provider. Document Released: 10/22/2005 Document Revised: 06/09/2016 Document Reviewed: 06/09/2016 Elsevier Interactive Patient Education  2018 Elsevier Inc.   

## 2017-09-11 NOTE — Progress Notes (Signed)
[facility] Progress Note   [name] [id number] [birth year] 71 y.o.  [name] is managed by Dr. Ladell Pier  Actively treated with chemotherapy: yes  Current Therapy: FOLFOX  Last Treated: 08/28/2017  Assessment: Plan:    Hypokalemia  Leukocytosis, unspecified type  Hypotension, unspecified hypotension type   Hypokalemia: This case was discussed with Dr. Benay Spice.  The patient was given potassium chloride 20 mEq in 1 L of IV fluid over 4 hours.  Leukocytosis: The patient has received Neulasta with his FOLFOX previously.  The patient was admitted to the hospital for further workup and management.  Hypotension: The patient was given 1 L of normal saline 4 hours.  Please see After Visit Summary for patient specific instructions.  Future Appointments  Date Time Provider [place]  09/13/2017  3:30 PM [facility] FLUSH NURSE [facility] None    No orders of the defined types were placed in this encounter.      Subjective:   Patient ID:  [name] is a 71 y.o. (DOB 03-09-1946) male.  Chief Complaint: No chief complaint on file.   HPI [name] presents to the chemotherapy infusion room today for consideration of cycle 6 of FOLFOX.  He was brought to my attention by his nurse that his blood pressure was 91/52 and that his recent labs returned with a WBC of 23.6, sodium of 131, potassium of 2.7, CO2 of 13, glucose of 183, BUN of 27.1, creatinine of 1.9, albumin of 2.5, and an anion gap of 14.  The patient and his wife report that he has been very weak and is not had much p.o. intake over the past 7-8 days due to nausea and anorexia.  The patient was to be seen by Ned Card, NP today.   Medications: I have reviewed the patient's current medications.  Allergies: No Known Allergies  Past Medical History:  Diagnosis Date  . Anxiety   . Dysrhythmia     a fib  . Hyperlipidemia   . Hypertension    managed  . Pneumonia  1950    Past Surgical History:  Procedure Laterality Date  . APPENDECTOMY  1950  . CARDIOVERSION    . EYE SURGERY Bilateral    catract surgery    Family History  Problem Relation Age of Onset  . Heart disease Father     Social History   Socioeconomic History  . Marital status: Widowed    Spouse name: Not on file  . Number of children: Not on file  . Years of education: Not on file  . Highest education level: Not on file  Social Needs  . Financial resource strain: Not on file  . Food insecurity - worry: Not on file  . Food insecurity - inability: Not on file  . Transportation needs - medical: Not on file  . Transportation needs - non-medical: Not on file  Occupational History  . Not on file  Tobacco Use  . Smoking status: Never Smoker  . Smokeless tobacco: Never Used  Substance and Sexual Activity  . Alcohol use: Yes    Alcohol/week: 0.0 oz    Comment: occasional  . Drug use: No  . Sexual activity: Not on file  Other Topics Concern  . Not on file  Social History Narrative  . Not on file    Past Medical History, Surgical history, Social history, and Family history were reviewed and updated as appropriate.   Please see review of systems  for further details on the patient's review from today.   Review of Systems:  Review of Systems  Constitutional: Positive for appetite change and fatigue. Negative for chills, diaphoresis and fever.  HENT: Positive for voice change.   Respiratory: Negative for shortness of breath.   Gastrointestinal: Positive for diarrhea (History of loose stools). Negative for constipation and nausea.    Objective:   Physical Exam:  There were no vitals taken for this visit. ECOG: 2   Physical Exam  Constitutional: No distress.  The patient is an elderly chronically ill-appearing male who was seen in the chemotherapy bedroom.  HENT:  Head: Normocephalic and atraumatic.  Cardiovascular: S1 normal and S2 normal. An irregularly  irregular rhythm present.  Pulmonary/Chest: Effort normal and breath sounds normal. No respiratory distress. He has no wheezes. He has no rales.  Abdominal: Soft. Bowel sounds are normal. He exhibits no distension. There is no tenderness. There is no rebound.  Skin: Skin is warm and dry. He is not diaphoretic.    Lab Review:     Component Value Date/Time   NA 131 (L) 09/11/2017 0949   K 2.7 Repeated and Verified (LL) 09/11/2017 0949   CL 108 07/02/2017 1144   CO2 13 Repeated and Verified (L) 09/11/2017 0949   GLUCOSE 183 (H) 09/11/2017 0949   BUN 27.1 (H) 09/11/2017 0949   CREATININE 1.9 (H) 09/11/2017 0949   CALCIUM 8.8 09/11/2017 0949   PROT 5.9 (L) 09/11/2017 0949   ALBUMIN 2.5 (L) 09/11/2017 0949   AST 24 09/11/2017 0949   ALT 27 09/11/2017 0949   ALKPHOS 93 09/11/2017 0949   BILITOT 0.77 09/11/2017 0949   GFRNONAA >60 07/02/2017 1144   GFRAA >60 07/02/2017 1144       Component Value Date/Time   WBC 23.6 (H) 09/11/2017 0947   WBC 6.4 07/02/2017 1144   RBC 5.35 09/11/2017 0947   RBC 3.76 (L) 07/02/2017 1144   HGB 13.6 09/11/2017 0947   HCT 40.9 09/11/2017 0947   PLT 181 09/11/2017 0947   MCV 76.4 (L) 09/11/2017 0947   MCH 25.4 (L) 09/11/2017 0947   MCH 24.7 (L) 07/02/2017 1144   MCHC 33.3 09/11/2017 0947   MCHC 30.4 07/02/2017 1144   RDW 20.5 (H) 09/11/2017 0947   LYMPHSABS 1.1 09/11/2017 0947   MONOABS 3.8 (H) 09/11/2017 0947   EOSABS 0.0 09/11/2017 0947   BASOSABS 0.1 09/11/2017 0947   -------------------------------  Imaging from last 24 hours (if applicable):  Radiology interpretation: No results found.      This case was discussed with Dr. Benay Spice. He expressed agreement with my management of this patient.

## 2017-09-12 ENCOUNTER — Inpatient Hospital Stay (HOSPITAL_COMMUNITY): Payer: Medicare HMO

## 2017-09-12 DIAGNOSIS — E871 Hypo-osmolality and hyponatremia: Secondary | ICD-10-CM

## 2017-09-12 DIAGNOSIS — C189 Malignant neoplasm of colon, unspecified: Secondary | ICD-10-CM

## 2017-09-12 DIAGNOSIS — C180 Malignant neoplasm of cecum: Secondary | ICD-10-CM

## 2017-09-12 DIAGNOSIS — D630 Anemia in neoplastic disease: Secondary | ICD-10-CM

## 2017-09-12 DIAGNOSIS — I4891 Unspecified atrial fibrillation: Secondary | ICD-10-CM

## 2017-09-12 DIAGNOSIS — E876 Hypokalemia: Secondary | ICD-10-CM

## 2017-09-12 DIAGNOSIS — E860 Dehydration: Secondary | ICD-10-CM

## 2017-09-12 DIAGNOSIS — C184 Malignant neoplasm of transverse colon: Secondary | ICD-10-CM

## 2017-09-12 DIAGNOSIS — Z7901 Long term (current) use of anticoagulants: Secondary | ICD-10-CM

## 2017-09-12 DIAGNOSIS — B3782 Candidal enteritis: Secondary | ICD-10-CM

## 2017-09-12 DIAGNOSIS — E785 Hyperlipidemia, unspecified: Secondary | ICD-10-CM

## 2017-09-12 DIAGNOSIS — D72829 Elevated white blood cell count, unspecified: Secondary | ICD-10-CM

## 2017-09-12 LAB — COMPREHENSIVE METABOLIC PANEL
ALBUMIN: 2.4 g/dL — AB (ref 3.5–5.0)
ALT: 25 U/L (ref 17–63)
ANION GAP: 9 (ref 5–15)
AST: 26 U/L (ref 15–41)
Alkaline Phosphatase: 83 U/L (ref 38–126)
BILIRUBIN TOTAL: 0.7 mg/dL (ref 0.3–1.2)
BUN: 28 mg/dL — ABNORMAL HIGH (ref 6–20)
CO2: 17 mmol/L — ABNORMAL LOW (ref 22–32)
Calcium: 8.2 mg/dL — ABNORMAL LOW (ref 8.9–10.3)
Chloride: 109 mmol/L (ref 101–111)
Creatinine, Ser: 1.53 mg/dL — ABNORMAL HIGH (ref 0.61–1.24)
GFR calc Af Amer: 50 mL/min — ABNORMAL LOW (ref >60)
GFR, EST NON AFRICAN AMERICAN: 43 mL/min — AB (ref >60)
GLUCOSE: 120 mg/dL — AB (ref 65–99)
POTASSIUM: 2.7 mmol/L — AB (ref 3.5–5.1)
Sodium: 135 mmol/L (ref 135–145)
TOTAL PROTEIN: 5.2 g/dL — AB (ref 6.5–8.1)

## 2017-09-12 LAB — MAGNESIUM: MAGNESIUM: 1.5 mg/dL — AB (ref 1.7–2.4)

## 2017-09-12 LAB — PROTIME-INR
INR: 11.58 — AB
Prothrombin Time: 88.8 seconds — ABNORMAL HIGH (ref 11.4–15.2)

## 2017-09-12 LAB — CBC
HEMATOCRIT: 34.8 % — AB (ref 39.0–52.0)
HEMOGLOBIN: 11.4 g/dL — AB (ref 13.0–17.0)
MCH: 24.8 pg — ABNORMAL LOW (ref 26.0–34.0)
MCHC: 32.8 g/dL (ref 30.0–36.0)
MCV: 75.7 fL — ABNORMAL LOW (ref 78.0–100.0)
Platelets: 190 10*3/uL (ref 150–400)
RBC: 4.6 MIL/uL (ref 4.22–5.81)
RDW: 20 % — ABNORMAL HIGH (ref 11.5–15.5)
WBC: 15.4 10*3/uL — AB (ref 4.0–10.5)

## 2017-09-12 MED ORDER — MAGNESIUM SULFATE 2 GM/50ML IV SOLN
2.0000 g | Freq: Once | INTRAVENOUS | Status: AC
  Administered 2017-09-12: 2 g via INTRAVENOUS
  Filled 2017-09-12: qty 50

## 2017-09-12 MED ORDER — DEXTROSE 5 % IV SOLN
5.0000 mg | Freq: Once | INTRAVENOUS | Status: AC
  Administered 2017-09-12: 5 mg via INTRAVENOUS
  Filled 2017-09-12: qty 0.5

## 2017-09-12 MED ORDER — LOPERAMIDE HCL 2 MG PO CAPS
2.0000 mg | ORAL_CAPSULE | Freq: Four times a day (QID) | ORAL | Status: DC | PRN
  Administered 2017-09-12: 2 mg via ORAL
  Filled 2017-09-12: qty 1

## 2017-09-12 NOTE — Progress Notes (Signed)
Patient ID: [name], male   DOB: 23-Jul-1946, 71 y.o.   MRN: [id number]  PROGRESS NOTE    KYE HEDDEN  UUV:[id number] DOB: 1946/02/16 DOA: 09/11/2017 PCP: [name], MD   Brief Narrative:  71 year old male with history of colon cancer currently undergoing chemotherapy, hypertension, hyperlipidemia was sent from oncology clinic on 09/11/2017 4 weakness and dehydration. He was found to be hyponatremic with acute kidney injury and leukocytosis. He was started on IV fluids.   Assessment & Plan:   Principal Problem:   ARF (acute renal failure) (HCC) Active Problems:   Colon cancer (HCC)   HLD (hyperlipidemia)   Essential hypertension   Hyponatremia   Leukocytosis  Acute renal failure - Probably from dehydration - Creatinine improving. Continue IV fluids - Will get renal ultrasound - Avoid nephrotoxic agents  Dehydration - Continue IV fluids  Generalized weakness - PT eval.  Colon cancer currently on chemotherapy - Follow oncology recommendations. Consultation is pending.   Hypertension - Monitor blood pressure. Continue metoprolol and Cardizem  Hypokalemia and hypomagnesemia - Replace. Repeat a.m. Labs  Mild hyponatremia - Improving. Repeat a.m. labs. Continue IV fluids  Leukocytosis - Probably reactive. Improving. - Follow cultures. Monitor off antibiotics. Repeat a.m. Labs  Supratherapeutic INR - Probably from Coumadin use. INR above 11 today. Patient already given intravenous vitamin K. - No signs of bleeding. Hold Coumadin. Repeat INR in a.m.  Chronic atrial fibrillation - With intermittent tachycardia. Continue Cardizem and metoprolol.   DVT prophylaxis: SCDs Code Status:  Full Family Communication: None at bedside Disposition Plan: Home in 1-2 days  Consultants: Oncology  Procedures: None  Antimicrobials: None    Subjective: Patient seen and examined at bedside. He feels much better. His appetite is improving. He still feels weak. No  overnight fever or vomiting.  Objective: Vitals:   09/11/17 1633 09/11/17 2128 09/11/17 2140 09/12/17 0542  BP: (!) 119/44 124/72 120/68 (!) 123/58  Pulse: (!) 110 (!) 107 68 (!) 113  Resp: 18  18 18   Temp: (!) 96.7 F (35.9 C)  (!) 97.4 F (36.3 C) 97.7 F (36.5 C)  TempSrc: Axillary  Oral Oral  SpO2: 100%  100% 98%    Intake/Output Summary (Last 24 hours) at 09/12/2017 1011 Last data filed at 09/12/2017 0550 Gross per 24 hour  Intake 1136.67 ml  Output -  Net 1136.67 ml   There were no vitals filed for this visit.  Examination:  General exam: Appears calm and comfortable  Respiratory system: Bilateral decreased breath sound at bases Cardiovascular system: S1 & S2 heard, intermittent tachycardia  Gastrointestinal system: Abdomen is nondistended, soft and nontender. Normal bowel sounds heard. Extremities: No cyanosis, clubbing; trace edema   Data Reviewed: I have personally reviewed following labs and imaging studies  CBC: Recent Labs  Lab 09/11/17 0947 09/12/17 0356  WBC 23.6* 15.4*  NEUTROABS 18.6*  --   HGB 13.6 11.4*  HCT 40.9 34.8*  MCV 76.4* 75.7*  PLT 181 474   Basic Metabolic Panel: Recent Labs  Lab 09/11/17 0949 09/12/17 0356  NA 131* 135  K 2.7 Repeated and Verified* 2.7*  CL  --  109  CO2 13 Repeated and Verified* 17*  GLUCOSE 183* 120*  BUN 27.1* 28*  CREATININE 1.9* 1.53*  CALCIUM 8.8 8.2*  MG  --  1.5*   GFR: CrCl cannot be calculated (Unknown ideal weight.). Liver Function Tests: Recent Labs  Lab 09/11/17 0949 09/12/17 0356  AST 24 26  ALT 27 25  ALKPHOS 93 83  BILITOT 0.77 0.7  PROT 5.9* 5.2*  ALBUMIN 2.5* 2.4*   No results for input(s): LIPASE, AMYLASE in the last 168 hours. No results for input(s): AMMONIA in the last 168 hours. Coagulation Profile: Recent Labs  Lab 09/11/17 0947 09/11/17 1010 09/12/17 0356  INR Sent out for confirmation. See results in EPIC. 8.30* 11.58*  PROTIME Sent out for confirmation. See  results in EPIC.  --   --    Cardiac Enzymes: No results for input(s): CKTOTAL, CKMB, CKMBINDEX, TROPONINI in the last 168 hours. BNP (last 3 results) No results for input(s): PROBNP in the last 8760 hours. HbA1C: No results for input(s): HGBA1C in the last 72 hours. CBG: No results for input(s): GLUCAP in the last 168 hours. Lipid Profile: No results for input(s): CHOL, HDL, LDLCALC, TRIG, CHOLHDL, LDLDIRECT in the last 72 hours. Thyroid Function Tests: No results for input(s): TSH, T4TOTAL, FREET4, T3FREE, THYROIDAB in the last 72 hours. Anemia Panel: No results for input(s): VITAMINB12, FOLATE, FERRITIN, TIBC, IRON, RETICCTPCT in the last 72 hours. Sepsis Labs: No results for input(s): PROCALCITON, LATICACIDVEN in the last 168 hours.  No results found for this or any previous visit (from the past 240 hour(s)).       Radiology Studies: Dg Chest Port 1 View  Result Date: 09/11/2017 CLINICAL DATA:  Leukocytosis.  Hypertension.  Pneumonia. EXAM: PORTABLE CHEST 1 VIEW COMPARISON:  07/02/2017 FINDINGS: Left central venous catheter with tip over the mid SVC region. No pneumothorax. Shallow inspiration with segmental elevation of the right hemidiaphragm. Blunting of the left costophrenic angle may indicate fluid or thickened pleura. No airspace disease or consolidation in the lungs. Mild cardiac enlargement without vascular congestion. Calcification of the aorta. IMPRESSION: Mild cardiac enlargement.  No evidence of active pulmonary disease. Electronically Signed   By: Lucienne Capers M.D.   On: 09/11/2017 21:36        Scheduled Meds: . atorvastatin  80 mg Oral QPM  . chlorhexidine  15 mL Mouth Rinse BID  . diltiazem  300 mg Oral QPM  . mouth rinse  15 mL Mouth Rinse q12n4p  . metoprolol tartrate  25 mg Oral BID  . potassium chloride  40 mEq Oral BID   Continuous Infusions: . sodium chloride 100 mL/hr at 09/11/17 1858     LOS: 1 day        Aline August, MD [facility] Pager [phone]  If 7PM-7AM, please contact night-coverage [email or web address] Password [email or web address] 09/12/2017, 10:11 AM

## 2017-09-12 NOTE — Progress Notes (Signed)
Lab called with critically low potassium of 2.7 and critically high INR of 11.58. Hospitalist has been notified. Will continue to monitor patient at this time.

## 2017-09-12 NOTE — Care Management Note (Signed)
Case Management Note  Patient Details  Name: [name] MRN: [id number] Date of Birth: Sep 30, 1946  Subjective/Objective:                   colon Cancer and dehydration due to iv chemo  Action/Plan: Date: September 12, 2017 Velva Harman, BSN, [place], [place] Chart and notes review for patient progress and needs. Will follow for case management and discharge needs. Next review date: [id number]  Expected Discharge Date:  (unknown)               Expected Discharge Plan:  Home/Self Care  In-House Referral:     Discharge planning Services  CM Consult  Post Acute Care Choice:    Choice offered to:     DME Arranged:    DME Agency:     HH Arranged:    HH Agency:     Status of Service:  In process, will continue to follow  If discussed at Long Length of Stay Meetings, dates discussed:    Additional Comments:  Leeroy Cha, RN 09/12/2017, 8:47 AM

## 2017-09-12 NOTE — Progress Notes (Signed)
ANTICOAGULATION CONSULT NOTE - Initial Consult  Pharmacy Consult for coumadin Indication: atrial fibrillation  No Known Allergies  Patient Measurements:    Vital Signs: Temp: 97.7 F (36.5 C) (11/08 0542) Temp Source: Oral (11/08 0542) BP: 123/58 (11/08 0542) Pulse Rate: 113 (11/08 0542)  Labs: Recent Labs    09/11/17 0947 09/11/17 0949 09/11/17 1010 09/12/17 0356  HGB 13.6  --   --  11.4*  HCT 40.9  --   --  34.8*  PLT 181  --   --  190  LABPROT  --   --  68.4* 88.8*  INR Sent out for confirmation. See results in EPIC.  --  8.30* 11.58*  CREATININE  --  1.9*  --  1.53*    CrCl cannot be calculated (Unknown ideal weight.).   Medical History: Past Medical History:  Diagnosis Date  . Anxiety   . Dysrhythmia     a fib  . Hyperlipidemia   . Hypertension    managed  . Pneumonia 1950    Medications:  Medications Prior to Admission  Medication Sig Dispense Refill Last Dose  . ALPRAZolam (XANAX) 0.5 MG tablet Take 0.5 mg by mouth 3 (three) times daily as needed for anxiety.    09/11/2017 at Unknown time  . atorvastatin (LIPITOR) 80 MG tablet Take 80 mg by mouth every evening.    09/10/2017 at Unknown time  . diltiazem (CARDIZEM CD) 300 MG 24 hr capsule Take 300 mg by mouth every evening.    09/10/2017 at Unknown time  . dorzolamidel-timolol (COSOPT) 22.3-6.8 MG/ML SOLN ophthalmic solution Place 1 drop into both eyes 2 (two) times daily.   09/10/2017 at Unknown time  . lidocaine-prilocaine (EMLA) cream Apply to port site one hour prior to use. Do not rub in. Cover with plastic. 30 g 0 09/11/2017 at Unknown time  . lisinopril (PRINIVIL,ZESTRIL) 10 MG tablet Take 10 mg by mouth daily.    09/10/2017 at Unknown time  . metoprolol (LOPRESSOR) 50 MG tablet Take 25 mg by mouth 2 (two) times daily.    09/10/2017 at 2100  . ondansetron (ZOFRAN) 8 MG tablet Take 1 tablet (8 mg total) every 8 (eight) hours as needed by mouth for nausea or vomiting. 20 tablet 1 09/10/2017 at Unknown time   . prochlorperazine (COMPAZINE) 10 MG tablet Take 1 tablet (10 mg total) by mouth every 6 (six) hours as needed for nausea or vomiting. 30 tablet 1 09/11/2017 at Unknown time  . warfarin (COUMADIN) 2 MG tablet Take 1 tablet (2 mg total) by mouth daily. 30 tablet 0 09/10/2017 at 2100  . dorzolamide-timolol (COSOPT) 22.3-6.8 MG/ML ophthalmic solution    Not Taking at Unknown time  . nystatin (MYCOSTATIN) 100000 UNIT/ML suspension Take 5 mLs by mouth 4 (four) times daily as needed (for oral thrush).    unknown  . traMADol (ULTRAM) 50 MG tablet Take 1-2 tablets (50-100 mg total) by mouth every 6 (six) hours as needed for moderate pain or severe pain. (Patient not taking: Reported on 07/17/2017) 20 tablet 0 Completed Course at Unknown time  . warfarin (COUMADIN) 5 MG tablet    Not Taking at Unknown time    Assessment: 71 yo M on coumadin PTA for afib.  INR 8.3>>11.58>vit K 5 mg IV given today.  Hg 13.6>>11.4, pltc ok.  No bleeding per pt report.    Goal of Therapy:  INR 2-3 Monitor platelets by anticoagulation protocol: Yes   Plan:  No coumadin today - given 5 mg  IV vitamin K for INR 11.48 Daily INR  Eudelia Bunch, Pharm.D. [phone] 09/12/2017 11:09 AM

## 2017-09-12 NOTE — Progress Notes (Signed)
IP PROGRESS NOTE  Subjective:   [name] reports feeling better.  He is ordering breakfast.  He now reports having 6-7 loose stools for the past week.  He had 3 bowel movements during the night.  No bleeding.  Objective: Vital signs in last 24 hours: Blood pressure (!) 123/58, pulse (!) 113, temperature 97.7 F (36.5 C), temperature source Oral, resp. rate 18, SpO2 98 %.  Intake/Output from previous day: 11/07 0701 - 11/08 0700 In: 1136.7 [I.V.:1086.7; IV Piggyback:50] Out: -   Physical Exam:  HEENT: No thrush Lungs: Clear bilaterally Cardiac: Irregular Abdomen: Nontender soft Extremities: No leg edema   Portacath/PICC-without erythema  Lab Results: Recent Labs    09/11/17 0947 09/12/17 0356  WBC 23.6* 15.4*  HGB 13.6 11.4*  HCT 40.9 34.8*  PLT 181 190    BMET Recent Labs    09/11/17 0949 09/12/17 0356  NA 131* 135  K 2.7 Repeated and Verified* 2.7*  CL  --  109  CO2 13 Repeated and Verified* 17*  GLUCOSE 183* 120*  BUN 27.1* 28*  CREATININE 1.9* 1.53*  CALCIUM 8.8 8.2*   PT/INR-11.58  Studies/Results: Dg Chest Port 1 View  Result Date: 09/11/2017 CLINICAL DATA:  Leukocytosis.  Hypertension.  Pneumonia. EXAM: PORTABLE CHEST 1 VIEW COMPARISON:  07/02/2017 FINDINGS: Left central venous catheter with tip over the mid SVC region. No pneumothorax. Shallow inspiration with segmental elevation of the right hemidiaphragm. Blunting of the left costophrenic angle may indicate fluid or thickened pleura. No airspace disease or consolidation in the lungs. Mild cardiac enlargement without vascular congestion. Calcification of the aorta. IMPRESSION: Mild cardiac enlargement.  No evidence of active pulmonary disease. Electronically Signed   By: Lucienne Capers M.D.   On: 09/11/2017 21:36    Medications: I have reviewed the patient's current medications.  Assessment/Plan:  1. Colon cancer-synchronous transverse and ascending colon masses, and multiple polyps noted on  colonoscopy 04/25/2017 ? 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 Staging CT 04/25/2017 with synchronous masses at the cecum and transverse colon with evidence of locally metastatic lymph node/direct extension of tumor ? 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 Cycle 1 FOLFOX 07/03/2017 ? Cycle 2 FOLFOX 07/17/2017 (oxaliplatin held due to mild neutropenia) ? Cycle 3 FOLFOX 07/31/2017 ? Cycle 4 FOLFOX 08/14/2017(Neulasta added) ? Cycle 5 FOLFOX 08/28/2017  2. Multiple polyps noted in the descending, sigmoid, and transverse colon on colonoscopy 04/25/2017-biopsy of a transverse colon polyp revealed low and high-grade dysplasia, other polyps returned as hyperplastic polyps and fragments of a tubular adenoma  3. History of iron deficiency anemia secondary to #1  4. Anxiety  5. Hypertension  6. Glaucoma  7. Atrial fibrillation-maintained on Coumadin  8. Multiple low-density pancreas lesions noted on CT 04/25/2017-likely benign  9.   Admission 09/11/2017 with dehydration-likely secondary to chemotherapy-induced enteritis  10.  Supratherapeutic PT/INR secondary to poor dietary intake  11.  Hypokalemia, hypomagnesemia secondary to diarrhea  Mr. Dibartolo is much more alert today.  The admission with dehydration and a supratherapeutic PT/INR is likely secondary to chemotherapy-induced enteritis.  I have a low clinical suspicion for C. difficile.  Recommendations:  1.  Continue intravenous hydration, follow BUN/creatinine 2.  Replete magnesium and potassium 3.  Hold Coumadin, do not resume until the INR is in the therapeutic range 4.  Please call Oncology as needed.  Outpatient follow-up will be scheduled at the Cancer center for the week of  09/16/2017.      LOS: 1 day   Betsy Coder, MD   09/12/2017, 12:56 PM

## 2017-09-12 NOTE — Evaluation (Signed)
Physical Therapy Evaluation Patient Details Name: [name] MRN: [id number] DOB: 10-31-1946 Today's Date: 09/12/2017   History of Present Illness  71 yo male admitted with ARF, hypokalemia, supratherapeutic INR. Hx of A fib, colon ca-on chemo, HTN.   Clinical Impression  On eval, pt was Min guard assist for mobility. He walked ~115 feet with a RW. Pt presents with general weakness, decreased activity tolerance, and impaired gait and balance. Discussed d/c plan-pt will return home with wife assisting as needed. Recommend HHPT f/u.     Follow Up Recommendations Home health PT;Supervision/Assistance - 24 hour    Equipment Recommendations  None recommended by PT    Recommendations for Other Services       Precautions / Restrictions Precautions Precautions: Fall Precaution Comments: supratherapeutic INR Restrictions Weight Bearing Restrictions: No      Mobility  Bed Mobility               General bed mobility comments: oob in recliner  Transfers Overall transfer level: Needs assistance Equipment used: Rolling walker (2 wheeled) Transfers: Sit to/from Stand Sit to Stand: Min guard         General transfer comment: close guard for safety. No assist given.  Ambulation/Gait Ambulation/Gait assistance: Min guard Ambulation Distance (Feet): 115 Feet Assistive device: Rolling walker (2 wheeled) Gait Pattern/deviations: Step-through pattern;Decreased stride length     General Gait Details: slow gait speed. close guard for safety. No assist given.   Stairs            Wheelchair Mobility    Modified Rankin (Stroke Patients Only)       Balance Overall balance assessment: Needs assistance         Standing balance support: Bilateral upper extremity supported Standing balance-Leahy Scale: Poor Standing balance comment: requring RW currently                             Pertinent Vitals/Pain Pain Assessment: No/denies pain    Home  Living Family/patient expects to be discharged to:: Private residence Living Arrangements: Spouse/significant other Available Help at Discharge: Available 24 hours/day;Family Type of Home: House Home Access: Stairs to enter Entrance Stairs-Rails: Psychiatric nurse of Steps: 3 Home Layout: One level Home Equipment: Environmental consultant - 2 wheels      Prior Function Level of Independence: Independent               Hand Dominance        Extremity/Trunk Assessment   Upper Extremity Assessment Upper Extremity Assessment: Generalized weakness    Lower Extremity Assessment Lower Extremity Assessment: Generalized weakness    Cervical / Trunk Assessment Cervical / Trunk Assessment: Normal  Communication   Communication: No difficulties  Cognition Arousal/Alertness: Awake/alert Behavior During Therapy: WFL for tasks assessed/performed Overall Cognitive Status: Within Functional Limits for tasks assessed                                        General Comments      Exercises     Assessment/Plan    PT Assessment Patient needs continued PT services  PT Problem List Decreased strength;Decreased mobility;Decreased activity tolerance;Decreased balance;Decreased knowledge of use of DME       PT Treatment Interventions DME instruction;Gait training;Functional mobility training;Therapeutic activities;Balance training;Patient/family education;Therapeutic exercise    PT Goals (Current goals can be found in the Care  Plan section)  Acute Rehab PT Goals Patient Stated Goal: regain independence and strength PT Goal Formulation: With patient/family Time For Goal Achievement: 09/26/17 Potential to Achieve Goals: Good    Frequency Min 3X/week   Barriers to discharge        Co-evaluation               AM-PAC PT "6 Clicks" Daily Activity  Outcome Measure Difficulty turning over in bed (including adjusting bedclothes, sheets and blankets)?: A  Little Difficulty moving from lying on back to sitting on the side of the bed? : A Little Difficulty sitting down on and standing up from a chair with arms (e.g., wheelchair, bedside commode, etc,.)?: A Little Help needed moving to and from a bed to chair (including a wheelchair)?: A Little Help needed walking in hospital room?: A Little Help needed climbing 3-5 steps with a railing? : A Little 6 Click Score: 18    End of Session Equipment Utilized During Treatment: Gait belt Activity Tolerance: Patient tolerated treatment well Patient left: in chair;with call bell/phone within reach;with family/visitor present   PT Visit Diagnosis: Muscle weakness (generalized) (M62.81);Difficulty in walking, not elsewhere classified (R26.2)    Time: 9604-5409 PT Time Calculation (min) (ACUTE ONLY): 10 min   Charges:   PT Evaluation $PT Eval Moderate Complexity: 1 Mod     PT G Codes:          Weston Anna, MPT Pager: [phone]

## 2017-09-13 ENCOUNTER — Other Ambulatory Visit: Payer: Self-pay | Admitting: Nurse Practitioner

## 2017-09-13 DIAGNOSIS — C189 Malignant neoplasm of colon, unspecified: Secondary | ICD-10-CM

## 2017-09-13 DIAGNOSIS — R197 Diarrhea, unspecified: Secondary | ICD-10-CM

## 2017-09-13 LAB — BASIC METABOLIC PANEL
Anion gap: 7 (ref 5–15)
BUN: 19 mg/dL (ref 6–20)
CALCIUM: 7.9 mg/dL — AB (ref 8.9–10.3)
CO2: 16 mmol/L — ABNORMAL LOW (ref 22–32)
Chloride: 115 mmol/L — ABNORMAL HIGH (ref 101–111)
Creatinine, Ser: 1.14 mg/dL (ref 0.61–1.24)
GFR calc Af Amer: >60 mL/min (ref >60)
GLUCOSE: 114 mg/dL — AB (ref 65–99)
Potassium: 2.9 mmol/L — ABNORMAL LOW (ref 3.5–5.1)
SODIUM: 138 mmol/L (ref 135–145)

## 2017-09-13 LAB — PROTIME-INR
INR: 1.35
Prothrombin Time: 16.5 seconds — ABNORMAL HIGH (ref 11.4–15.2)

## 2017-09-13 LAB — CBC WITH DIFFERENTIAL/PLATELET
BASOS ABS: 0.1 10*3/uL (ref 0.0–0.1)
Basophils Relative: 0 %
EOS ABS: 0 10*3/uL (ref 0.0–0.7)
EOS PCT: 0 %
HCT: 34.7 % — ABNORMAL LOW (ref 39.0–52.0)
Hemoglobin: 11.2 g/dL — ABNORMAL LOW (ref 13.0–17.0)
LYMPHS ABS: 1.2 10*3/uL (ref 0.7–4.0)
LYMPHS PCT: 8 %
MCH: 24.7 pg — AB (ref 26.0–34.0)
MCHC: 32.3 g/dL (ref 30.0–36.0)
MCV: 76.4 fL — AB (ref 78.0–100.0)
MONO ABS: 1.8 10*3/uL — AB (ref 0.1–1.0)
Monocytes Relative: 13 %
Neutro Abs: 11.3 10*3/uL — ABNORMAL HIGH (ref 1.7–7.7)
Neutrophils Relative %: 79 %
PLATELETS: 207 10*3/uL (ref 150–400)
RBC: 4.54 MIL/uL (ref 4.22–5.81)
RDW: 20.3 % — AB (ref 11.5–15.5)
WBC: 14.3 10*3/uL — AB (ref 4.0–10.5)

## 2017-09-13 LAB — MAGNESIUM: MAGNESIUM: 1.8 mg/dL (ref 1.7–2.4)

## 2017-09-13 MED ORDER — WARFARIN SODIUM 1 MG PO TABS: 1.0000 mg | ORAL_TABLET | Freq: Every day | ORAL | 0 refills | Status: DC

## 2017-09-13 MED ORDER — POTASSIUM CHLORIDE CRYS ER 20 MEQ PO TBCR: 40.0000 meq | EXTENDED_RELEASE_TABLET | Freq: Two times a day (BID) | ORAL | 0 refills | Status: DC

## 2017-09-13 MED ORDER — LOPERAMIDE HCL 2 MG PO CAPS: 2.0000 mg | ORAL_CAPSULE | Freq: Four times a day (QID) | ORAL | 0 refills | Status: DC | PRN

## 2017-09-13 MED ORDER — HEPARIN SOD (PORK) LOCK FLUSH 100 UNIT/ML IV SOLN
500.0000 [IU] | INTRAVENOUS | Status: AC | PRN
  Administered 2017-09-13: 500 [IU]

## 2017-09-13 MED ORDER — WARFARIN - PHARMACIST DOSING INPATIENT: Freq: Every day | Status: DC

## 2017-09-13 MED ORDER — WARFARIN SODIUM 1 MG PO TABS
1.0000 mg | ORAL_TABLET | Freq: Once | ORAL | Status: AC
  Administered 2017-09-13: 1 mg via ORAL
  Filled 2017-09-13: qty 1

## 2017-09-13 NOTE — Discharge Summary (Signed)
Physician Discharge Summary  [name] FIE:[id number] DOB: 04-Sep-1946 DOA: 09/11/2017  PCP: Frederick Gravel, MD  Admit date: 09/11/2017 Discharge date: 09/13/2017  Admitted From: Home  Disposition:  Home  Recommendations for Outpatient Follow-up:  1. Follow up with PCP in 1 week 2. Follow-up with oncology at earliest convenience with repeat CBC/BMP/INR 3. Please get INR checked on 09/16/2017 and Coumadin will be dosed according to INR level. 4. Please take Coumadin 1 mg in the evenings till INR rechecked on 09/16/2017 5. Follow-up in the ED if symptoms worsen or new appear  Home Health: Yes  Equipment/Devices: None  Discharge Condition: Stable  CODE STATUS: Full  Diet recommendation: Heart Healthy  Brief/Interim Summary:  71 year old male with history of colon cancer currently undergoing chemotherapy, hypertension, hyperlipidemia was sent from oncology clinic on 09/11/2017 4 weakness and dehydration. He was found to be hyponatremic with acute kidney injury and leukocytosis. He was started on IV fluids. His condition improved. Kidney function improved. Oncology evaluated the patient and has cleared the patient for discharge. Patient will be discharged home today with home PT/nursing evaluation and follow-up to prevent further deconditioning and readmission.     Discharge Diagnoses:  Principal Problem:   ARF (acute renal failure) (HCC) Active Problems:   Colon cancer (HCC)   HLD (hyperlipidemia)   Essential hypertension   Hyponatremia   Leukocytosis   Acute renal failure - Probably from dehydration -  treated with IV fluids - Resolved -  renal ultrasound did not show any hydronephrosis -  outpatient follow-up of BMP in a week  Dehydration -  treated with IV fluids. Improved.  Generalized weakness -  improving. Continue home health PT  Diarrhea - Probably chemotherapy related. Imodium when necessary. Outpatient follow-up with oncology  Colon cancer currently on  chemotherapy -  oncology evaluation appreciated. Outpatient follow-up with oncology  Hypertension - Monitor blood pressure. Continue metoprolol and Cardizem  Hypokalemia and hypomagnesemia -  continue potassium to placement as an outpatient.  - Outpatient follow-up  Mild hyponatremia -Resolved  Leukocytosis - Probably reactive. Improving. -  cultures negative so far - Monitoring off antibiotics. Outpatient follow-up  Supratherapeutic INR - Probably from Coumadin use. No signs of bleeding - Status post IV vitamin K 1 dose on 09/12/2017. - INR is subtherapeutic today. Discharge home on Coumadin 1 mg daily till repeat INR check on 09/16/2017  Chronic atrial fibrillation -  Continue Cardizem and metoprolol. - Currently rate controlled   Discharge Instructions  Discharge Instructions    Call MD for:  difficulty breathing, headache or visual disturbances   Complete by:  As directed    Call MD for:  extreme fatigue   Complete by:  As directed    Call MD for:  hives   Complete by:  As directed    Call MD for:  persistant dizziness or light-headedness   Complete by:  As directed    Call MD for:  persistant nausea and vomiting   Complete by:  As directed    Call MD for:  severe uncontrolled pain   Complete by:  As directed    Call MD for:  temperature >100.4   Complete by:  As directed    Diet - low sodium heart healthy   Complete by:  As directed    Diet - low sodium heart healthy   Complete by:  As directed    Discharge instructions   Complete by:  As directed    Please get INR checked on 09/16/2017  Increase activity slowly   Complete by:  As directed    Increase activity slowly   Complete by:  As directed      Allergies as of 09/13/2017   No Known Allergies     Medication List    STOP taking these medications   traMADol 50 MG tablet Commonly known as:  ULTRAM     TAKE these medications   ALPRAZolam 0.5 MG tablet Commonly known as:  XANAX Take  0.5 mg by mouth 3 (three) times daily as needed for anxiety.   atorvastatin 80 MG tablet Commonly known as:  LIPITOR Take 80 mg by mouth every evening.   diltiazem 300 MG 24 hr capsule Commonly known as:  CARDIZEM CD Take 300 mg by mouth every evening.   dorzolamidel-timolol 22.3-6.8 MG/ML Soln ophthalmic solution Commonly known as:  COSOPT Place 1 drop into both eyes 2 (two) times daily. What changed:  Another medication with the same name was removed. Continue taking this medication, and follow the directions you see here.   lidocaine-prilocaine cream Commonly known as:  EMLA Apply to port site one hour prior to use. Do not rub in. Cover with plastic.   lisinopril 10 MG tablet Commonly known as:  PRINIVIL,ZESTRIL Take 10 mg by mouth daily.   loperamide 2 MG capsule Commonly known as:  IMODIUM Take 1 capsule (2 mg total) every 6 (six) hours as needed by mouth for diarrhea or loose stools.   metoprolol tartrate 50 MG tablet Commonly known as:  LOPRESSOR Take 25 mg by mouth 2 (two) times daily.   nystatin 100000 UNIT/ML suspension Commonly known as:  MYCOSTATIN Take 5 mLs by mouth 4 (four) times daily as needed (for oral thrush).   ondansetron 8 MG tablet Commonly known as:  ZOFRAN Take 1 tablet (8 mg total) every 8 (eight) hours as needed by mouth for nausea or vomiting.   potassium chloride SA 20 MEQ tablet Commonly known as:  K-DUR,KLOR-CON Take 2 tablets (40 mEq total) 2 (two) times daily by mouth.   prochlorperazine 10 MG tablet Commonly known as:  COMPAZINE Take 1 tablet (10 mg total) by mouth every 6 (six) hours as needed for nausea or vomiting.   warfarin 1 MG tablet Commonly known as:  COUMADIN Take 1 tablet (1 mg total) daily at 6 PM by mouth. What changed:    medication strength  how much to take  when to take this  Another medication with the same name was removed. Continue taking this medication, and follow the directions you see here.       Follow-up Information    Frederick Gravel, MD. Schedule an appointment as soon as possible for a visit in 1 week(s).   Specialty:  Internal Medicine Contact information: [place] [address] [phone]        Frederick Pier, MD Follow up.   Specialty:  Oncology Why:  at earliest convenience with repeat CBC/BMP/INR Contact information: [place] [address] [phone]          No Known Allergies  Consultations:  Oncology   Procedures/Studies: US Renal  Result Date: 09/12/2017 CLINICAL DATA:  Renal failure. EXAM: RENAL / URINARY TRACT ULTRASOUND COMPLETE COMPARISON:  None. FINDINGS: Right Kidney: Length: 12.7 cm. Normal echotexture. No hydronephrosis. 3.4 cm and 2.8 cm exophytic cysts. Left Kidney: Length: 12.6 cm. Normal echotexture. Diffuse cortical thinning and prominent renal sinus fat. No hydronephrosis. Bladder: Appears normal for degree of bladder distention.  IMPRESSION: 1. Diffuse left renal cortical thinning and prominent renal sinus fat. 2. No hydronephrosis or other acute abnormality. Electronically Signed   By: Claudie Revering M.D.   On: 09/12/2017 13:27   Dg Chest Port 1 View  Result Date: 09/11/2017 CLINICAL DATA:  Leukocytosis.  Hypertension.  Pneumonia. EXAM: PORTABLE CHEST 1 VIEW COMPARISON:  07/02/2017 FINDINGS: Left central venous catheter with tip over the mid SVC region. No pneumothorax. Shallow inspiration with segmental elevation of the right hemidiaphragm. Blunting of the left costophrenic angle may indicate fluid or thickened pleura. No airspace disease or consolidation in the lungs. Mild cardiac enlargement without vascular congestion. Calcification of the aorta. IMPRESSION: Mild cardiac enlargement.  No evidence of active pulmonary disease. Electronically Signed   By: Lucienne Capers M.D.   On: 09/11/2017 21:36     Subjective: Patient seen and examined at bedside. He feels much better. His  appetite is much improved. He is still having diarrhea which is improving. He was to go home. No overnight fever or vomiting.  Discharge Exam: Vitals:   09/12/17 2112 09/13/17 0620  BP: 127/64 105/60  Pulse: 93 98  Resp: 16 16  Temp: (!) 97.3 F (36.3 C) 97.7 F (36.5 C)  SpO2: 100% 99%   Vitals:   09/12/17 0542 09/12/17 1400 09/12/17 2112 09/13/17 0620  BP: (!) 123/58 108/71 127/64 105/60  Pulse: (!) 113 96 93 98  Resp: 18 18 16 16   Temp: 97.7 F (36.5 C) 98.6 F (37 C) (!) 97.3 F (36.3 C) 97.7 F (36.5 C)  TempSrc: Oral Oral Oral Oral  SpO2: 98% 99% 100% 99%    General: Pt is alert, awake, not in acute distress Cardiovascular: Rate controlled, S1/S2 + Respiratory: Bilateral decreased breath sounds at bases  Abdominal: Soft, NT, ND, bowel sounds + Extremities: no edema, no cyanosis    The results of significant diagnostics from this hospitalization (including imaging, microbiology, ancillary and laboratory) are listed below for reference.     Microbiology: Recent Results (from the past 240 hour(s))  Culture, blood (routine x 2)     Status: None (Preliminary result)   Collection Time: 09/11/17  6:43 PM  Result Value Ref Range Status   Specimen Description BLOOD LEFT ANTECUBITAL  Final   Special Requests   Final    BOTTLES DRAWN AEROBIC ONLY Blood Culture adequate volume   Culture   Final    NO GROWTH < 24 HOURS Performed at [facility], [address], [place], [place] [address]    Report Status PENDING  Incomplete  Culture, blood (routine x 2)     Status: None (Preliminary result)   Collection Time: 09/11/17  6:55 PM  Result Value Ref Range Status   Specimen Description BLOOD RIGHT ANTECUBITAL  Final   Special Requests IN PEDIATRIC BOTTLE Blood Culture adequate volume  Final   Culture   Final    NO GROWTH < 24 HOURS Performed at [facility], [place] [address] [place], [place] [address]    Report Status PENDING  Incomplete     Labs: BNP  (last 3 results) No results for input(s): BNP in the last 8760 hours. Basic Metabolic Panel: Recent Labs  Lab 09/11/17 0949 09/12/17 0356 09/13/17 0548  NA 131* 135 138  K 2.7 Repeated and Verified* 2.7* 2.9*  CL  --  109 115*  CO2 13 Repeated and Verified* 17* 16*  GLUCOSE 183* 120* 114*  BUN 27.1* 28* 19  CREATININE 1.9* 1.53* 1.14  CALCIUM 8.8 8.2* 7.9*  MG  --  1.5* 1.8   Liver Function Tests: Recent Labs  Lab 09/11/17 0949 09/12/17 0356  AST 24 26  ALT 27 25  ALKPHOS 93 83  BILITOT 0.77 0.7  PROT 5.9* 5.2*  ALBUMIN 2.5* 2.4*   No results for input(s): LIPASE, AMYLASE in the last 168 hours. No results for input(s): AMMONIA in the last 168 hours. CBC: Recent Labs  Lab 09/11/17 0947 09/12/17 0356 09/13/17 0548  WBC 23.6* 15.4* 14.3*  NEUTROABS 18.6*  --  11.3*  HGB 13.6 11.4* 11.2*  HCT 40.9 34.8* 34.7*  MCV 76.4* 75.7* 76.4*  PLT 181 190 207   Cardiac Enzymes: No results for input(s): CKTOTAL, CKMB, CKMBINDEX, TROPONINI in the last 168 hours. BNP: Invalid input(s): POCBNP CBG: No results for input(s): GLUCAP in the last 168 hours. D-Dimer No results for input(s): DDIMER in the last 72 hours. Hgb A1c No results for input(s): HGBA1C in the last 72 hours. Lipid Profile No results for input(s): CHOL, HDL, LDLCALC, TRIG, CHOLHDL, LDLDIRECT in the last 72 hours. Thyroid function studies No results for input(s): TSH, T4TOTAL, T3FREE, THYROIDAB in the last 72 hours.  Invalid input(s): FREET3 Anemia work up No results for input(s): VITAMINB12, FOLATE, FERRITIN, TIBC, IRON, RETICCTPCT in the last 72 hours. Urinalysis    Component Value Date/Time   COLORURINE AMBER (A) 09/11/2017 2115   APPEARANCEUR CLOUDY (A) 09/11/2017 2115   LABSPEC 1.025 09/11/2017 2115   PHURINE 5.0 09/11/2017 2115   GLUCOSEU NEGATIVE 09/11/2017 2115   HGBUR NEGATIVE 09/11/2017 2115   BILIRUBINUR NEGATIVE 09/11/2017 2115   KETONESUR NEGATIVE 09/11/2017 2115   PROTEINUR 100 (A)  09/11/2017 2115   NITRITE NEGATIVE 09/11/2017 2115   LEUKOCYTESUR NEGATIVE 09/11/2017 2115   Sepsis Labs Invalid input(s): PROCALCITONIN,  WBC,  LACTICIDVEN Microbiology Recent Results (from the past 240 hour(s))  Culture, blood (routine x 2)     Status: None (Preliminary result)   Collection Time: 09/11/17  6:43 PM  Result Value Ref Range Status   Specimen Description BLOOD LEFT ANTECUBITAL  Final   Special Requests   Final    BOTTLES DRAWN AEROBIC ONLY Blood Culture adequate volume   Culture   Final    NO GROWTH < 24 HOURS Performed at [facility], [address] [place], [place] [address]    Report Status PENDING  Incomplete  Culture, blood (routine x 2)     Status: None (Preliminary result)   Collection Time: 09/11/17  6:55 PM  Result Value Ref Range Status   Specimen Description BLOOD RIGHT ANTECUBITAL  Final   Special Requests IN PEDIATRIC BOTTLE Blood Culture adequate volume  Final   Culture   Final    NO GROWTH < 24 HOURS Performed at [facility], [place] [address] [place], [place] [address]    Report Status PENDING  Incomplete     Time coordinating discharge: 35 minutes  SIGNED:   Aline August, MD  [facility] 09/13/2017, 10:07 AM Pager: [phone]  If 7PM-7AM, please contact night-coverage [email or web address] Password [email or web address]

## 2017-09-13 NOTE — Progress Notes (Signed)
MD orders for home health PT/RN. Patient offered choice for home health services and [facility] chosen. [facility] alerted of referral.  Marney Doctor RN,BSN,NCM [phone]

## 2017-09-13 NOTE — Progress Notes (Signed)
Discharge instructions and medications discussed with patient.  AVS given to patient.  All questions answered.  

## 2017-09-13 NOTE — Progress Notes (Signed)
ANTICOAGULATION CONSULT NOTE - Follow up [facility] for warfarin Indication: atrial fibrillation  No Known Allergies  Patient Measurements:    Vital Signs: Temp: 97.7 F (36.5 C) (11/09 0620) Temp Source: Oral (11/09 0620) BP: 105/60 (11/09 0620) Pulse Rate: 98 (11/09 0620)  Labs: Recent Labs    09/11/17 0947 09/11/17 0949 09/11/17 1010 09/12/17 0356 09/13/17 0548  HGB 13.6  --   --  11.4* 11.2*  HCT 40.9  --   --  34.8* 34.7*  PLT 181  --   --  190 207  LABPROT  --   --  68.4* 88.8* 16.5*  INR Sent out for confirmation. See results in EPIC.  --  8.30* 11.58* 1.35  CREATININE  --  1.9*  --  1.53* 1.14    CrCl cannot be calculated (Unknown ideal weight.).   Medications:  . atorvastatin  80 mg Oral QPM  . chlorhexidine  15 mL Mouth Rinse BID  . diltiazem  300 mg Oral QPM  . mouth rinse  15 mL Mouth Rinse q12n4p  . metoprolol tartrate  25 mg Oral BID  . potassium chloride  40 mEq Oral BID     Assessment: 71 yo M admitted on 11/7 with weakness and dehydration.  PMH significant for Colon cancer undergoing chemotherapy and chronic warfarin anticoagulation for afib.  Admission INR was elevated.  Pharmacy is consulted to dose warfarin inpatient.  PTA warfarin dose: 2mg  PO daily.  Last dose taken on 11/6, then held. Admission INR 8.3  Today, 09/13/2017:  INR significantly decreased (11.58 >> 1.35) after Vit K 5mg  IV on 11/9  CBC: Hgb 11.2 remains stable, Plt 207 WNL  No bleeding or complications reported.  AKI improving  Diet: regular, eating small amounts of meals.  He reports decreased appetite, but improving.  Diarrhea is improving.  Drug-drug interactions: Vit K may suppress INR for several days.    Goal of Therapy:  INR 2-3 Monitor platelets by anticoagulation protocol: Yes   Plan:   Warfarin 1 mg PO today x1 dose prior to discharge.  Daily INR  For discharge, recommend warfarin 1mg  PO daily until follow up on Monday, 11/12.  Pt  and wife report that they will f/u with Cardiology for INR on 11/12.    Gretta Arab PharmD, BCPS Pager [phone] 09/13/2017 7:52 AM

## 2017-09-14 ENCOUNTER — Encounter (HOSPITAL_COMMUNITY): Payer: Self-pay | Admitting: Emergency Medicine

## 2017-09-14 ENCOUNTER — Other Ambulatory Visit: Payer: Self-pay

## 2017-09-14 ENCOUNTER — Emergency Department (HOSPITAL_COMMUNITY): Payer: Medicare HMO

## 2017-09-14 ENCOUNTER — Inpatient Hospital Stay (HOSPITAL_COMMUNITY)
Admission: EM | Admit: 2017-09-14 | Discharge: 2017-09-24 | DRG: 871 | Disposition: A | Discharge status: Home Health | Payer: Medicare HMO | Attending: Family Medicine | Admitting: Family Medicine

## 2017-09-14 DIAGNOSIS — J181 Lobar pneumonia, unspecified organism: Secondary | ICD-10-CM | POA: Diagnosis present

## 2017-09-14 DIAGNOSIS — I482 Chronic atrial fibrillation: Secondary | ICD-10-CM | POA: Diagnosis present

## 2017-09-14 DIAGNOSIS — K1230 Oral mucositis (ulcerative), unspecified: Secondary | ICD-10-CM | POA: Diagnosis present

## 2017-09-14 DIAGNOSIS — J189 Pneumonia, unspecified organism: Secondary | ICD-10-CM

## 2017-09-14 DIAGNOSIS — J90 Pleural effusion, not elsewhere classified: Secondary | ICD-10-CM | POA: Diagnosis not present

## 2017-09-14 DIAGNOSIS — R339 Retention of urine, unspecified: Secondary | ICD-10-CM | POA: Diagnosis present

## 2017-09-14 DIAGNOSIS — Z79899 Other long term (current) drug therapy: Secondary | ICD-10-CM

## 2017-09-14 DIAGNOSIS — E860 Dehydration: Secondary | ICD-10-CM | POA: Diagnosis present

## 2017-09-14 DIAGNOSIS — Y92230 Patient room in hospital as the place of occurrence of the external cause: Secondary | ICD-10-CM | POA: Diagnosis present

## 2017-09-14 DIAGNOSIS — G4733 Obstructive sleep apnea (adult) (pediatric): Secondary | ICD-10-CM | POA: Diagnosis present

## 2017-09-14 DIAGNOSIS — L8990 Pressure ulcer of unspecified site, unspecified stage: Secondary | ICD-10-CM

## 2017-09-14 DIAGNOSIS — E669 Obesity, unspecified: Secondary | ICD-10-CM | POA: Diagnosis present

## 2017-09-14 DIAGNOSIS — J9601 Acute respiratory failure with hypoxia: Secondary | ICD-10-CM | POA: Diagnosis present

## 2017-09-14 DIAGNOSIS — R6521 Severe sepsis with septic shock: Secondary | ICD-10-CM | POA: Diagnosis present

## 2017-09-14 DIAGNOSIS — E871 Hypo-osmolality and hyponatremia: Secondary | ICD-10-CM | POA: Diagnosis present

## 2017-09-14 DIAGNOSIS — R0902 Hypoxemia: Secondary | ICD-10-CM | POA: Diagnosis not present

## 2017-09-14 DIAGNOSIS — D630 Anemia in neoplastic disease: Secondary | ICD-10-CM | POA: Diagnosis present

## 2017-09-14 DIAGNOSIS — R0600 Dyspnea, unspecified: Secondary | ICD-10-CM | POA: Diagnosis not present

## 2017-09-14 DIAGNOSIS — R972 Elevated prostate specific antigen [PSA]: Secondary | ICD-10-CM | POA: Diagnosis present

## 2017-09-14 DIAGNOSIS — I071 Rheumatic tricuspid insufficiency: Secondary | ICD-10-CM | POA: Diagnosis present

## 2017-09-14 DIAGNOSIS — Y95 Nosocomial condition: Secondary | ICD-10-CM | POA: Diagnosis present

## 2017-09-14 DIAGNOSIS — Z9842 Cataract extraction status, left eye: Secondary | ICD-10-CM

## 2017-09-14 DIAGNOSIS — Z9049 Acquired absence of other specified parts of digestive tract: Secondary | ICD-10-CM

## 2017-09-14 DIAGNOSIS — I110 Hypertensive heart disease with heart failure: Secondary | ICD-10-CM | POA: Diagnosis present

## 2017-09-14 DIAGNOSIS — D638 Anemia in other chronic diseases classified elsewhere: Secondary | ICD-10-CM | POA: Diagnosis present

## 2017-09-14 DIAGNOSIS — Z6836 Body mass index (BMI) 36.0-36.9, adult: Secondary | ICD-10-CM

## 2017-09-14 DIAGNOSIS — E46 Unspecified protein-calorie malnutrition: Secondary | ICD-10-CM | POA: Diagnosis present

## 2017-09-14 DIAGNOSIS — B370 Candidal stomatitis: Secondary | ICD-10-CM | POA: Diagnosis present

## 2017-09-14 DIAGNOSIS — E785 Hyperlipidemia, unspecified: Secondary | ICD-10-CM | POA: Diagnosis present

## 2017-09-14 DIAGNOSIS — Z9841 Cataract extraction status, right eye: Secondary | ICD-10-CM

## 2017-09-14 DIAGNOSIS — N179 Acute kidney failure, unspecified: Secondary | ICD-10-CM | POA: Diagnosis present

## 2017-09-14 DIAGNOSIS — R579 Shock, unspecified: Secondary | ICD-10-CM | POA: Diagnosis not present

## 2017-09-14 DIAGNOSIS — E872 Acidosis: Secondary | ICD-10-CM | POA: Diagnosis present

## 2017-09-14 DIAGNOSIS — C189 Malignant neoplasm of colon, unspecified: Secondary | ICD-10-CM | POA: Diagnosis present

## 2017-09-14 DIAGNOSIS — Z7901 Long term (current) use of anticoagulants: Secondary | ICD-10-CM

## 2017-09-14 DIAGNOSIS — I509 Heart failure, unspecified: Secondary | ICD-10-CM | POA: Diagnosis present

## 2017-09-14 DIAGNOSIS — K529 Noninfective gastroenteritis and colitis, unspecified: Secondary | ICD-10-CM | POA: Diagnosis present

## 2017-09-14 DIAGNOSIS — E878 Other disorders of electrolyte and fluid balance, not elsewhere classified: Secondary | ICD-10-CM | POA: Diagnosis present

## 2017-09-14 DIAGNOSIS — E876 Hypokalemia: Secondary | ICD-10-CM | POA: Diagnosis present

## 2017-09-14 DIAGNOSIS — Z8249 Family history of ischemic heart disease and other diseases of the circulatory system: Secondary | ICD-10-CM

## 2017-09-14 DIAGNOSIS — T45515A Adverse effect of anticoagulants, initial encounter: Secondary | ICD-10-CM | POA: Diagnosis present

## 2017-09-14 DIAGNOSIS — R5381 Other malaise: Secondary | ICD-10-CM | POA: Diagnosis not present

## 2017-09-14 DIAGNOSIS — A419 Sepsis, unspecified organism: Secondary | ICD-10-CM | POA: Diagnosis present

## 2017-09-14 DIAGNOSIS — F419 Anxiety disorder, unspecified: Secondary | ICD-10-CM | POA: Diagnosis present

## 2017-09-14 DIAGNOSIS — R609 Edema, unspecified: Secondary | ICD-10-CM | POA: Diagnosis not present

## 2017-09-14 DIAGNOSIS — I481 Persistent atrial fibrillation: Secondary | ICD-10-CM | POA: Diagnosis not present

## 2017-09-14 DIAGNOSIS — R0603 Acute respiratory distress: Secondary | ICD-10-CM

## 2017-09-14 LAB — CBC WITH DIFFERENTIAL/PLATELET
Basophils Absolute: 0 10*3/uL (ref 0.0–0.1)
Basophils Relative: 0 %
Eosinophils Absolute: 0 10*3/uL (ref 0.0–0.7)
Eosinophils Relative: 0 %
HCT: 39.9 % (ref 39.0–52.0)
Hemoglobin: 12.9 g/dL — ABNORMAL LOW (ref 13.0–17.0)
Lymphocytes Relative: 4 %
Lymphs Abs: 1.4 10*3/uL (ref 0.7–4.0)
MCH: 25.2 pg — ABNORMAL LOW (ref 26.0–34.0)
MCHC: 32.3 g/dL (ref 30.0–36.0)
MCV: 77.9 fL — ABNORMAL LOW (ref 78.0–100.0)
Monocytes Absolute: 3.9 10*3/uL — ABNORMAL HIGH (ref 0.1–1.0)
Monocytes Relative: 11 %
Neutro Abs: 30.5 10*3/uL — ABNORMAL HIGH (ref 1.7–7.7)
Neutrophils Relative %: 85 %
Platelets: 291 10*3/uL (ref 150–400)
RBC: 5.12 MIL/uL (ref 4.22–5.81)
RDW: 20.5 % — ABNORMAL HIGH (ref 11.5–15.5)
WBC: 35.8 10*3/uL — ABNORMAL HIGH (ref 4.0–10.5)

## 2017-09-14 LAB — BASIC METABOLIC PANEL
Anion gap: 9 (ref 5–15)
BUN: 16 mg/dL (ref 6–20)
CO2: 14 mmol/L — ABNORMAL LOW (ref 22–32)
Calcium: 8.6 mg/dL — ABNORMAL LOW (ref 8.9–10.3)
Chloride: 113 mmol/L — ABNORMAL HIGH (ref 101–111)
Creatinine, Ser: 1.1 mg/dL (ref 0.61–1.24)
GFR calc Af Amer: >60 mL/min (ref >60)
GFR calc non Af Amer: >60 mL/min (ref >60)
Glucose, Bld: 176 mg/dL — ABNORMAL HIGH (ref 65–99)
Potassium: 3.5 mmol/L (ref 3.5–5.1)
Sodium: 136 mmol/L (ref 135–145)

## 2017-09-14 LAB — PROTIME-INR
INR: 1.67
Prothrombin Time: 19.5 seconds — ABNORMAL HIGH (ref 11.4–15.2)

## 2017-09-14 LAB — MAGNESIUM: Magnesium: 1.7 mg/dL (ref 1.7–2.4)

## 2017-09-14 LAB — EXPECTORATED SPUTUM ASSESSMENT W GRAM STAIN, RFLX TO RESP C

## 2017-09-14 LAB — TROPONIN I: Troponin I: <0.03 ng/mL (ref <0.03)

## 2017-09-14 LAB — LACTIC ACID, PLASMA: Lactic Acid, Venous: 1.4 mmol/L (ref 0.5–1.9)

## 2017-09-14 LAB — EXPECTORATED SPUTUM ASSESSMENT W REFEX TO RESP CULTURE

## 2017-09-14 LAB — BRAIN NATRIURETIC PEPTIDE: B Natriuretic Peptide: 505.8 pg/mL — ABNORMAL HIGH (ref 0.0–100.0)

## 2017-09-14 LAB — PHOSPHORUS: Phosphorus: 2.9 mg/dL (ref 2.5–4.6)

## 2017-09-14 MED ORDER — LOPERAMIDE HCL 2 MG PO CAPS: 2.0000 mg | ORAL_CAPSULE | Freq: Four times a day (QID) | ORAL | Status: DC | PRN

## 2017-09-14 MED ORDER — ALBUTEROL SULFATE (2.5 MG/3ML) 0.083% IN NEBU: 2.5000 mg | INHALATION_SOLUTION | Freq: Four times a day (QID) | RESPIRATORY_TRACT | Status: DC | PRN

## 2017-09-14 MED ORDER — ATORVASTATIN CALCIUM 40 MG PO TABS
80.0000 mg | ORAL_TABLET | Freq: Every evening | ORAL | Status: DC
  Administered 2017-09-14: 80 mg via ORAL
  Filled 2017-09-14: qty 2

## 2017-09-14 MED ORDER — TIMOLOL MALEATE 0.5 % OP SOLN
1.0000 [drp] | Freq: Two times a day (BID) | OPHTHALMIC | Status: DC
  Administered 2017-09-14 – 2017-09-24 (×20): 1 [drp] via OPHTHALMIC
  Filled 2017-09-14 (×2): qty 5

## 2017-09-14 MED ORDER — ACETAMINOPHEN 325 MG PO TABS: 650.0000 mg | ORAL_TABLET | Freq: Four times a day (QID) | ORAL | Status: DC | PRN

## 2017-09-14 MED ORDER — METOPROLOL TARTRATE 25 MG PO TABS
25.0000 mg | ORAL_TABLET | Freq: Two times a day (BID) | ORAL | Status: DC
  Administered 2017-09-14: 25 mg via ORAL
  Filled 2017-09-14: qty 1

## 2017-09-14 MED ORDER — DILTIAZEM HCL ER COATED BEADS 180 MG PO CP24
300.0000 mg | ORAL_CAPSULE | Freq: Every evening | ORAL | Status: DC
  Administered 2017-09-14: 300 mg via ORAL
  Filled 2017-09-14: qty 1

## 2017-09-14 MED ORDER — ORAL CARE MOUTH RINSE
15.0000 mL | Freq: Two times a day (BID) | OROMUCOSAL | Status: DC
  Administered 2017-09-15 – 2017-09-17 (×4): 15 mL via OROMUCOSAL

## 2017-09-14 MED ORDER — DILTIAZEM LOAD VIA INFUSION
20.0000 mg | Freq: Once | INTRAVENOUS | Status: AC
  Administered 2017-09-14: 20 mg via INTRAVENOUS
  Filled 2017-09-14: qty 20

## 2017-09-14 MED ORDER — POTASSIUM CHLORIDE CRYS ER 20 MEQ PO TBCR: 60.0000 meq | EXTENDED_RELEASE_TABLET | Freq: Once | ORAL | Status: DC

## 2017-09-14 MED ORDER — WARFARIN SODIUM 1 MG PO TABS
1.0000 mg | ORAL_TABLET | ORAL | Status: AC
  Administered 2017-09-14: 1 mg via ORAL
  Filled 2017-09-14: qty 1

## 2017-09-14 MED ORDER — VANCOMYCIN HCL 10 G IV SOLR: 1250.0000 mg | Freq: Two times a day (BID) | INTRAVENOUS | Status: DC

## 2017-09-14 MED ORDER — DORZOLAMIDE HCL 2 % OP SOLN
1.0000 [drp] | Freq: Two times a day (BID) | OPHTHALMIC | Status: DC
  Administered 2017-09-14 – 2017-09-24 (×20): 1 [drp] via OPHTHALMIC
  Filled 2017-09-14 (×2): qty 10

## 2017-09-14 MED ORDER — POTASSIUM CHLORIDE CRYS ER 20 MEQ PO TBCR
40.0000 meq | EXTENDED_RELEASE_TABLET | Freq: Two times a day (BID) | ORAL | Status: DC
  Administered 2017-09-14 – 2017-09-16 (×4): 40 meq via ORAL
  Filled 2017-09-14 (×4): qty 2

## 2017-09-14 MED ORDER — DM-GUAIFENESIN ER 30-600 MG PO TB12
1.0000 | ORAL_TABLET | Freq: Two times a day (BID) | ORAL | Status: DC
  Administered 2017-09-14: 1 via ORAL
  Filled 2017-09-14: qty 1

## 2017-09-14 MED ORDER — IPRATROPIUM BROMIDE 0.02 % IN SOLN: 0.5000 mg | Freq: Four times a day (QID) | RESPIRATORY_TRACT | Status: DC | PRN

## 2017-09-14 MED ORDER — SODIUM CHLORIDE 0.9 % IV SOLN
2000.0000 mg | Freq: Once | INTRAVENOUS | Status: AC
  Administered 2017-09-14: 2000 mg via INTRAVENOUS
  Filled 2017-09-14: qty 2000

## 2017-09-14 MED ORDER — MAGNESIUM CITRATE PO SOLN: 1.0000 | Freq: Once | ORAL | Status: DC | PRN

## 2017-09-14 MED ORDER — BISACODYL 5 MG PO TBEC: 5.0000 mg | DELAYED_RELEASE_TABLET | Freq: Every day | ORAL | Status: DC | PRN

## 2017-09-14 MED ORDER — ACETAMINOPHEN 650 MG RE SUPP: 650.0000 mg | Freq: Four times a day (QID) | RECTAL | Status: DC | PRN

## 2017-09-14 MED ORDER — PIPERACILLIN-TAZOBACTAM 3.375 G IVPB 30 MIN
3.3750 g | Freq: Once | INTRAVENOUS | Status: AC
  Administered 2017-09-14: 3.375 g via INTRAVENOUS
  Filled 2017-09-14: qty 50

## 2017-09-14 MED ORDER — DORZOLAMIDE HCL-TIMOLOL MAL PF 22.3-6.8 MG/ML OP SOLN
1.0000 [drp] | Freq: Two times a day (BID) | OPHTHALMIC | Status: DC
Start: 2017-09-14 — End: 2017-09-14

## 2017-09-14 MED ORDER — ONDANSETRON HCL 4 MG PO TABS: 4.0000 mg | ORAL_TABLET | Freq: Four times a day (QID) | ORAL | Status: DC | PRN

## 2017-09-14 MED ORDER — PIPERACILLIN-TAZOBACTAM 3.375 G IVPB
3.3750 g | Freq: Three times a day (TID) | INTRAVENOUS | Status: DC
  Administered 2017-09-14 – 2017-09-16 (×5): 3.375 g via INTRAVENOUS
  Filled 2017-09-14 (×5): qty 50

## 2017-09-14 MED ORDER — LISINOPRIL 10 MG PO TABS: 10.0000 mg | ORAL_TABLET | Freq: Every day | ORAL | Status: DC

## 2017-09-14 MED ORDER — FUROSEMIDE 10 MG/ML IJ SOLN
40.0000 mg | Freq: Once | INTRAMUSCULAR | Status: AC
  Administered 2017-09-14: 40 mg via INTRAVENOUS
  Filled 2017-09-14: qty 4

## 2017-09-14 MED ORDER — SENNOSIDES-DOCUSATE SODIUM 8.6-50 MG PO TABS
1.0000 | ORAL_TABLET | Freq: Every evening | ORAL | Status: DC | PRN
Start: 2017-09-14 — End: 2017-09-15

## 2017-09-14 MED ORDER — ONDANSETRON HCL 4 MG/2ML IJ SOLN: 4.0000 mg | Freq: Four times a day (QID) | INTRAMUSCULAR | Status: DC | PRN

## 2017-09-14 MED ORDER — CHLORHEXIDINE GLUCONATE 0.12 % MT SOLN
15.0000 mL | Freq: Two times a day (BID) | OROMUCOSAL | Status: DC
  Administered 2017-09-16 – 2017-09-24 (×5): 15 mL via OROMUCOSAL
  Filled 2017-09-14 (×9): qty 15

## 2017-09-14 MED ORDER — WARFARIN - PHARMACIST DOSING INPATIENT: Freq: Every day | Status: DC

## 2017-09-14 MED ORDER — PROCHLORPERAZINE MALEATE 10 MG PO TABS: 10.0000 mg | ORAL_TABLET | Freq: Four times a day (QID) | ORAL | Status: DC | PRN

## 2017-09-14 MED ORDER — DILTIAZEM HCL-DEXTROSE 100-5 MG/100ML-% IV SOLN (PREMIX)
5.0000 mg/h | INTRAVENOUS | Status: DC
  Administered 2017-09-14: 5 mg/h via INTRAVENOUS
  Filled 2017-09-14: qty 100

## 2017-09-14 MED ORDER — OXYCODONE HCL 5 MG PO TABS: 5.0000 mg | ORAL_TABLET | ORAL | Status: DC | PRN

## 2017-09-14 MED ORDER — ALPRAZOLAM 0.5 MG PO TABS
0.5000 mg | ORAL_TABLET | Freq: Three times a day (TID) | ORAL | Status: DC | PRN
  Administered 2017-09-16 – 2017-09-19 (×2): 0.5 mg via ORAL
  Filled 2017-09-14 (×2): qty 1

## 2017-09-14 MED ORDER — NYSTATIN 100000 UNIT/ML MT SUSP: 5.0000 mL | Freq: Four times a day (QID) | OROMUCOSAL | Status: DC | PRN

## 2017-09-14 NOTE — ED Notes (Signed)
Bed: WA16 Expected date:  Expected time:  Means of arrival:  Comments: 

## 2017-09-14 NOTE — Progress Notes (Signed)
A consult was received from an ED physician for vancomycin per pharmacy dosing.  The patient's profile has been reviewed for ht/wt/allergies/indication/available labs.   A one time order has been placed for vancomycin 2g IV x 1.  Further antibiotics/pharmacy consults should be ordered by admitting physician if indicated.                       Thank you, Peggyann Juba, PharmD, BCPS Pager: [phone] 09/14/2017  5:12 PM

## 2017-09-14 NOTE — H&P (Signed)
History and Physical   [facility] [place] [facility] Admission History and Physical [employer], D.O.    Patient Name: [name] MR#: [id number] Date of Birth: May 09, 1946 Date of Admission: 09/14/2017  Referring MD/NP/PA: Dr. Wilson Singer Primary Care Physician: [name], MD  Chief Complaint:  Chief Complaint  Patient presents with  . Weakness  . Cough    HPI: [name] is a 71 y.o. male with a known history of colon cancer on chemo therapy, HTN, HLD, afib on Coumadin who presents to the emergency department for evaluation of weakness and cough.  Patient was in a usual state of health until 09/11/17 he was admitted to this facility with hyponatremia, AKI and leukocytosis.  He was given fluids and discharged home yesterday. Wife states that he was improving at home, eating and drinking however he became increasingly congested, with productive cough, weakness.    Patient denies fevers/chills, weakness, dizziness, chest pain, shortness of breath, N/V/C/D, abdominal pain, dysuria/frequency, changes in mental status.   EMS/ED Course: Patient received lasix, Zosyn, Vanco and KCl. He was placed on cardizem drip for rapid afib.  Medical admission has been requested for further management of HCAP, CHF, Afib with RVR and hypokalemia.  Review of Systems:  CONSTITUTIONAL: No fever/chills, fatigue, weakness, weight gain/loss, headache. EYES: No blurry or double vision. ENT: No tinnitus, postnasal drip, redness or soreness of the oropharynx. RESPIRATORY: Positive cough, congestion, dyspnea, negative wheeze.  No hemoptysis.  CARDIOVASCULAR: No chest pain, palpitations, syncope, orthopnea. No lower extremity edema.  GASTROINTESTINAL: No nausea, vomiting, abdominal pain, diarrhea, constipation.  No hematemesis, melena or hematochezia. GENITOURINARY: No dysuria, frequency, hematuria. ENDOCRINE: No polyuria or nocturia. No heat or cold intolerance. HEMATOLOGY: No anemia,  bruising, bleeding. INTEGUMENTARY: No rashes, ulcers, lesions. MUSCULOSKELETAL: No arthritis, gout, dyspnea. NEUROLOGIC: No numbness, tingling, ataxia, seizure-type activity, weakness. PSYCHIATRIC: No anxiety, depression, insomnia.   Past Medical History:  Diagnosis Date  . Anxiety   . Dysrhythmia     a fib  . Hyperlipidemia   . Hypertension    managed  . Pneumonia 1950    Past Surgical History:  Procedure Laterality Date  . APPENDECTOMY  1950  . CARDIOVERSION    . EYE SURGERY Bilateral    catract surgery     reports that  has never smoked. he has never used smokeless tobacco. He reports that he drinks alcohol. He reports that he does not use drugs.  No Known Allergies  Family History  Problem Relation Age of Onset  . Heart disease Father     Prior to Admission medications   Medication Sig Start Date End Date Taking? Authorizing Provider  ALPRAZolam Duanne Moron) 0.5 MG tablet Take 0.5 mg by mouth 3 (three) times daily as needed for anxiety.  05/01/13   [provider]  atorvastatin (LIPITOR) 80 MG tablet Take 80 mg by mouth every evening.  04/16/13   [provider]  diltiazem (CARDIZEM CD) 300 MG 24 hr capsule Take 300 mg by mouth every evening.     [provider]  dorzolamidel-timolol (COSOPT) 22.3-6.8 MG/ML SOLN ophthalmic solution Place 1 drop into both eyes 2 (two) times daily.    [provider]  lidocaine-prilocaine (EMLA) cream Apply to port site one hour prior to use. Do not rub in. Cover with plastic. 06/28/17   Ladell Pier, MD  lisinopril (PRINIVIL,ZESTRIL) 10 MG tablet Take 10 mg by mouth daily.  04/16/13   [provider]  loperamide (IMODIUM) 2 MG capsule  Take 1 capsule (2 mg total) every 6 (six) hours as needed by mouth for diarrhea or loose stools. 09/13/17   Aline August, MD  metoprolol (LOPRESSOR) 50 MG tablet Take 25 mg by mouth 2 (two) times daily.     [provider]  nystatin (MYCOSTATIN) 100000  UNIT/ML suspension Take 5 mLs by mouth 4 (four) times daily as needed (for oral thrush).     [provider]  ondansetron (ZOFRAN) 8 MG tablet Take 1 tablet (8 mg total) every 8 (eight) hours as needed by mouth for nausea or vomiting. 09/09/17   Ladell Pier, MD  potassium chloride SA (K-DUR,KLOR-CON) 20 MEQ tablet Take 2 tablets (40 mEq total) 2 (two) times daily by mouth. 09/13/17   Aline August, MD  prochlorperazine (COMPAZINE) 10 MG tablet Take 1 tablet (10 mg total) by mouth every 6 (six) hours as needed for nausea or vomiting. 06/28/17   Ladell Pier, MD  warfarin (COUMADIN) 1 MG tablet Take 1 tablet (1 mg total) daily at 6 PM by mouth. 09/13/17   Aline August, MD    Physical Exam: Vitals:   09/14/17 1810 09/14/17 1815 09/14/17 1820 09/14/17 1830  BP: 119/60 107/65 106/70 (!) 108/57  Pulse: (!) 128 (!) 117 (!) 118 (!) 128  Resp: (!) 24 (!) 26 (!) 27 (!) 30  Temp:      TempSrc:      SpO2: 100% 99% 100% 99%  Weight:      Height:        GENERAL: 71 y.o.-year-old male patient, well-developed, well-nourished lying in the bed in no acute distress.  Pleasant and cooperative.   HEENT: Head atraumatic, normocephalic. Pupils equal. Mucus membranes moist. Tongue discoloration NECK: Supple, full range of motion. No JVD, no bruit heard. No thyroid enlargement, no tenderness, no cervical lymphadenopathy. CHEST: Coarse breath sounds with gurgling/upper airway congestion audible without stethoscope No use of accessory muscles of respiration.  No reproducible chest wall tenderness.   CARDIOVASCULAR: Distant heart sounds. S1, S2 normal. No murmurs, rubs, or gallops. Cap refill <2 seconds. Pulses intact distally.  ABDOMEN: Soft, nondistended, nontender. No rebound, guarding, rigidity. Normoactive bowel sounds present in all four quadrants.  EXTREMITIES: Trace pedal edema, without yanosis, or clubbing. No calf tenderness or Homan's sign.  NEUROLOGIC: The patient is alert and oriented x  3. Cranial nerves II through XII are grossly intact with no focal sensorimotor deficit. PSYCHIATRIC:  Normal affect, mood, thought content. SKIN: Warm, dry, and intact without obvious rash, lesion, or ulcer.    Labs on Admission:  CBC: Recent Labs  Lab 09/11/17 0947 09/12/17 0356 09/13/17 0548 09/14/17 1628  WBC 23.6* 15.4* 14.3* 35.8*  NEUTROABS 18.6*  --  11.3* 30.5*  HGB 13.6 11.4* 11.2* 12.9*  HCT 40.9 34.8* 34.7* 39.9  MCV 76.4* 75.7* 76.4* 77.9*  PLT 181 190 207 825   Basic Metabolic Panel: Recent Labs  Lab 09/11/17 0949 09/12/17 0356 09/13/17 0548 09/14/17 1628  NA 131* 135 138 136  K 2.7 Repeated and Verified* 2.7* 2.9* 3.5  CL  --  109 115* 113*  CO2 13 Repeated and Verified* 17* 16* 14*  GLUCOSE 183* 120* 114* 176*  BUN 27.1* 28* 19 16  CREATININE 1.9* 1.53* 1.14 1.10  CALCIUM 8.8 8.2* 7.9* 8.6*  MG  --  1.5* 1.8  --    GFR: Estimated Creatinine Clearance: 84.7 mL/min (by C-G formula based on SCr of 1.1 mg/dL). Liver Function Tests: Recent Labs  Lab 09/11/17 [id number]  09/12/17 0356  AST 24 26  ALT 27 25  ALKPHOS 93 83  BILITOT 0.77 0.7  PROT 5.9* 5.2*  ALBUMIN 2.5* 2.4*   No results for input(s): LIPASE, AMYLASE in the last 168 hours. No results for input(s): AMMONIA in the last 168 hours. Coagulation Profile: Recent Labs  Lab 09/11/17 0947 09/11/17 1010 09/12/17 0356 09/13/17 0548  INR Sent out for confirmation. See results in EPIC. 8.30* 11.58* 1.35  PROTIME Sent out for confirmation. See results in EPIC.  --   --   --    Cardiac Enzymes: Recent Labs  Lab 09/14/17 1628  TROPONINI <0.03   BNP (last 3 results) No results for input(s): PROBNP in the last 8760 hours. HbA1C: No results for input(s): HGBA1C in the last 72 hours. CBG: No results for input(s): GLUCAP in the last 168 hours. Lipid Profile: No results for input(s): CHOL, HDL, LDLCALC, TRIG, CHOLHDL, LDLDIRECT in the last 72 hours. Thyroid Function Tests: No results for  input(s): TSH, T4TOTAL, FREET4, T3FREE, THYROIDAB in the last 72 hours. Anemia Panel: No results for input(s): VITAMINB12, FOLATE, FERRITIN, TIBC, IRON, RETICCTPCT in the last 72 hours. Urine analysis:    Component Value Date/Time   COLORURINE AMBER (A) 09/11/2017 2115   APPEARANCEUR CLOUDY (A) 09/11/2017 2115   LABSPEC 1.025 09/11/2017 2115   PHURINE 5.0 09/11/2017 2115   GLUCOSEU NEGATIVE 09/11/2017 2115   HGBUR NEGATIVE 09/11/2017 2115   BILIRUBINUR NEGATIVE 09/11/2017 2115   KETONESUR NEGATIVE 09/11/2017 2115   PROTEINUR 100 (A) 09/11/2017 2115   NITRITE NEGATIVE 09/11/2017 2115   LEUKOCYTESUR NEGATIVE 09/11/2017 2115   Sepsis Labs: @LABRCNTIP (procalcitonin:4,lacticidven:4) ) Recent Results (from the past 240 hour(s))  Culture, blood (routine x 2)     Status: None (Preliminary result)   Collection Time: 09/11/17  6:43 PM  Result Value Ref Range Status   Specimen Description BLOOD LEFT ANTECUBITAL  Final   Special Requests   Final    BOTTLES DRAWN AEROBIC ONLY Blood Culture adequate volume   Culture   Final    NO GROWTH 3 DAYS Performed at [facility], [place] [address], [place], [place] [address]    Report Status PENDING  Incomplete  Culture, blood (routine x 2)     Status: None (Preliminary result)   Collection Time: 09/11/17  6:55 PM  Result Value Ref Range Status   Specimen Description BLOOD RIGHT ANTECUBITAL  Final   Special Requests IN PEDIATRIC BOTTLE Blood Culture adequate volume  Final   Culture   Final    NO GROWTH 3 DAYS Performed at [facility], [place] [address] [place], [place] [address]    Report Status PENDING  Incomplete     Radiological Exams on Admission: Dg Chest Portable 1 View  Result Date: 09/14/2017 CLINICAL DATA:  Weakness and productive cough. EXAM: PORTABLE CHEST 1 VIEW COMPARISON:  09/11/2017 FINDINGS: Injectable port in stable position. Enlarged cardiac silhouette. Calcific atherosclerotic disease of the aorta. There is no  evidence of pleural effusion or pneumothorax. Patchy lower lobe predominant peribronchial airspace opacities. Osseous structures are without acute abnormality. Soft tissues are grossly normal. IMPRESSION: Patchy lower lobe predominant peribronchial airspace opacities may represent asymmetry pulmonary edema or developing bronchopneumonia. Electronically Signed   By: Fidela Salisbury M.D.   On: 09/14/2017 17:04    EKG: Atrial fibrillation with RVR at 137 bpm with normal axis and nonspecific ST-T wave changes.   Assessment/Plan This is a 71 year old male with history of colon cancer  on chemo, HTN, HLD admitted with:   # HCAP, recent hospitalization, immunocompromised.  - Admit inpatient - Zosyn, Vanco - Follow up sputum cultures - O2, nebs, expectorants, suctioning as needed  #. Congestive heart failure, unclear if acute or chronic. Elevated BNP, trace lower extremity edema - Lasix ordered in ED, continue lisinopril. - Check echo ,patient reportedly had an echo and stress test in august 2018 with Dr. Sherrie Mustache prior to colon resection.  - Check I/O, daily weights  # Afib with RVR, likely 2/2 infection - Rate control with IV/PO diltiazem, PO metoprolol (home meds) - Continue Coumadin - Daily INR  #. Hypokalemia, mild - Replaced PO in ED - Recheck BMP in AM - Check mag level  # Colon cancer - Outpatient follow up with Dr. Benay Spice  #. HLD - Continue home meds - atorvastatin  #. HTN - Continue home meds - lisinopril, metoprolol   Admission status: Inpatient IV Fluids: HL Diet/Nutrition: Heart healthy Consults called: None  DVT Px: Coumadin,  SCDs and early ambulation. Code Status: Full Code  Disposition Plan: To home in 2-3 days  All the records are reviewed and case discussed with ED provider. Management plans discussed with the patient and/or family who express understanding and agree with plan of care.  [employer] D.O. on 09/14/2017 at 6:39 PM Office  [phone] CC: Primary care physician; [name], MD   09/14/2017, 6:39 PM

## 2017-09-14 NOTE — ED Notes (Signed)
Dc cardizem drip

## 2017-09-14 NOTE — Progress Notes (Signed)
Pharmacy Antibiotic Note  [name] is a 71 y.o. male admitted on 09/14/2017 with pneumonia.  Pharmacy has been consulted for Vancomycin and Zosyn dosing.  Plan: Zosyn 3.375g IV q8h (4 hour infusion).   Vancomycin 2gm iv x1, then 1250mg  iv q12hr Goal AUC = 400 - 500 for all indications, except meningitis (goal AUC > 500 and Cmin 15-20 mcg/mL)   Height: 6\' 2"  (188 cm) Weight: 257 lb 0.9 oz (116.6 kg) IBW/kg (Calculated) : 82.2  Temp (24hrs), Avg:98.3 F (36.8 C), Min:98.2 F (36.8 C), Max:98.4 F (36.9 C)  Recent Labs  Lab 09/11/17 0947 09/11/17 0949 09/12/17 0356 09/13/17 0548 09/14/17 1628 09/14/17 1746  WBC 23.6*  --  15.4* 14.3* 35.8*  --   CREATININE  --  1.9* 1.53* 1.14 1.10  --   LATICACIDVEN  --   --   --   --   --  1.4    Estimated Creatinine Clearance: 81.2 mL/min (by C-G formula based on SCr of 1.1 mg/dL).    No Known Allergies  Antimicrobials this admission: Vancomycin 09/14/2017 >> Zosyn 09/14/2017 >>   Dose adjustments this admission: -  Microbiology results: pending  Thank you for allowing pharmacy to be a part of this patient's care.  [name] 09/14/2017 9:30 PM

## 2017-09-14 NOTE — ED Provider Notes (Signed)
[facility] DEPT Provider Note   CSN: [id number] Arrival date & time: 09/14/17  1553     History   Chief Complaint Chief Complaint  Patient presents with  . Weakness  . Cough    HPI [name] is a 71 y.o. male.  HPI  71 year old male with dyspnea.  He reports worsening over the past 36 hours.  Nonproductive cough.  Increasing lower extremity swelling.  Denies any acute pain.  Of note, patient was just discharged to the hospital yesterday after being admitted with hyponatremia/AKI.   His renal function improved with IV fluids.  Past Medical History:  Diagnosis Date  . Anxiety   . Dysrhythmia     a fib  . Hyperlipidemia   . Hypertension    managed  . Pneumonia 1950    Patient Active Problem List   Diagnosis Date Noted  . HLD (hyperlipidemia) 09/11/2017  . Essential hypertension 09/11/2017  . Hyponatremia 09/11/2017  . ARF (acute renal failure) ([place]) 09/11/2017  . Leukocytosis 09/11/2017  . Port catheter in place 07/17/2017  . Colon cancer ([place]) 06/06/2017  . History of ventral hernia repair, 06/23/2013 07/10/2013  . Umbilical hernia 93/26/7124    Past Surgical History:  Procedure Laterality Date  . APPENDECTOMY  1950  . CARDIOVERSION    . EYE SURGERY Bilateral    catract surgery       Home Medications    Prior to Admission medications   Medication Sig Start Date End Date Taking? Authorizing Provider  ALPRAZolam Duanne Moron) 0.5 MG tablet Take 0.5 mg by mouth 3 (three) times daily as needed for anxiety.  05/01/13   [provider]  atorvastatin (LIPITOR) 80 MG tablet Take 80 mg by mouth every evening.  04/16/13   [provider]  diltiazem (CARDIZEM CD) 300 MG 24 hr capsule Take 300 mg by mouth every evening.     [provider]  dorzolamidel-timolol (COSOPT) 22.3-6.8 MG/ML SOLN ophthalmic solution Place 1 drop into both eyes 2 (two) times daily.    [provider]  lidocaine-prilocaine  (EMLA) cream Apply to port site one hour prior to use. Do not rub in. Cover with plastic. 06/28/17   Ladell Pier, MD  lisinopril (PRINIVIL,ZESTRIL) 10 MG tablet Take 10 mg by mouth daily.  04/16/13   [provider]  loperamide (IMODIUM) 2 MG capsule Take 1 capsule (2 mg total) every 6 (six) hours as needed by mouth for diarrhea or loose stools. 09/13/17   Aline August, MD  metoprolol (LOPRESSOR) 50 MG tablet Take 25 mg by mouth 2 (two) times daily.     [provider]  nystatin (MYCOSTATIN) 100000 UNIT/ML suspension Take 5 mLs by mouth 4 (four) times daily as needed (for oral thrush).     [provider]  ondansetron (ZOFRAN) 8 MG tablet Take 1 tablet (8 mg total) every 8 (eight) hours as needed by mouth for nausea or vomiting. 09/09/17   Ladell Pier, MD  potassium chloride SA (K-DUR,KLOR-CON) 20 MEQ tablet Take 2 tablets (40 mEq total) 2 (two) times daily by mouth. 09/13/17   Aline August, MD  prochlorperazine (COMPAZINE) 10 MG tablet Take 1 tablet (10 mg total) by mouth every 6 (six) hours as needed for nausea or vomiting. 06/28/17   Ladell Pier, MD  warfarin (COUMADIN) 1 MG tablet Take 1 tablet (1 mg total) daily at 6 PM by mouth. 09/13/17   Aline August, MD    Family History Family History  Problem Relation Age of Onset  . Heart disease Father     Social History Social History   Tobacco Use  . Smoking status: Never Smoker  . Smokeless tobacco: Never Used  Substance Use Topics  . Alcohol use: Yes    Alcohol/week: 0.0 oz    Comment: occasional  . Drug use: No     Allergies   Patient has no known allergies.   Review of Systems Review of Systems  All systems reviewed and negative, other than as noted in HPI.   Physical Exam Updated Vital Signs BP 137/70 (BP Location: Right Arm)   Pulse (!) 129   Temp 98.2 F (36.8 C) (Oral)   Resp (!) 22   SpO2 95%   Physical Exam  Constitutional: He appears well-developed and  well-nourished. No distress.  HENT:  Head: Normocephalic and atraumatic.  Eyes: Conjunctivae are normal. Right eye exhibits no discharge. Left eye exhibits no discharge.  Neck: Neck supple.  Cardiovascular: Normal heart sounds. Exam reveals no gallop and no friction rub.  No murmur heard. irreg irreg. tachy  Pulmonary/Chest: He is in respiratory distress.  Tachypneic. Crackles b/l  Abdominal: Soft. He exhibits no distension. There is no tenderness.  Musculoskeletal: He exhibits edema. He exhibits no tenderness.  Neurological: He is alert.  Skin: Skin is warm and dry.  Psychiatric: He has a normal mood and affect. His behavior is normal. Thought content normal.  Nursing note and vitals reviewed.    ED Treatments / Results  Labs (all labs ordered are listed, but only abnormal results are displayed) Labs Reviewed  BASIC METABOLIC PANEL - Abnormal; Notable for the following components:      Result Value   Chloride 113 (*)    CO2 14 (*)    Glucose, Bld 176 (*)    Calcium 8.6 (*)    All other components within normal limits  CBC WITH DIFFERENTIAL/PLATELET - Abnormal; Notable for the following components:   WBC 35.8 (*)    Hemoglobin 12.9 (*)    MCV 77.9 (*)    MCH 25.2 (*)    RDW 20.5 (*)    Neutro Abs 30.5 (*)    Monocytes Absolute 3.9 (*)    All other components within normal limits  BRAIN NATRIURETIC PEPTIDE - Abnormal; Notable for the following components:   B Natriuretic Peptide 505.8 (*)    All other components within normal limits  TROPONIN I  PROTIME-INR  LACTIC ACID, PLASMA    EKG  EKG Interpretation  Date/Time:  Saturday September 14 2017 16:20:01 EST Ventricular Rate:  137 PR Interval:    QRS Duration: 100 QT Interval:  285 QTC Calculation: 411 R Axis:   14 Text Interpretation:  Atrial fibrillation Premature ventricular complexes Low voltage, precordial leads Repolarization abnormality, prob rate related Confirmed by Virgel Manifold [id number]) on 09/14/2017  5:47:19 PM       Radiology Dg Chest Portable 1 View  Result Date: 09/14/2017 CLINICAL DATA:  Weakness and productive cough. EXAM: PORTABLE CHEST 1 VIEW COMPARISON:  09/11/2017 FINDINGS: Injectable port in stable position. Enlarged cardiac silhouette. Calcific atherosclerotic disease of the aorta. There is no evidence of pleural effusion or pneumothorax. Patchy lower lobe predominant peribronchial airspace opacities. Osseous structures are without acute abnormality. Soft tissues are grossly normal. IMPRESSION: Patchy lower lobe predominant peribronchial airspace opacities may represent asymmetry pulmonary edema or developing bronchopneumonia. Electronically Signed   By: Fidela Salisbury M.D.   On: 09/14/2017 17:04    Procedures Procedures (including critical  care time)  Medications Ordered in ED Medications - No data to display   Initial Impression / Assessment and Plan / ED Course  I have reviewed the triage vital signs and the nursing notes.  Pertinent labs & imaging results that were available during my care of the patient were reviewed by me and considered in my medical decision making (see chart for details).     71yM with dyspnea/hypoxemia. CXR with asymmetric edema versus possible infiltrate. Will cover for possible pneumonia. Afebrile, but has pretty significant leukocytosis. HCAP coverage.   Probably some element of HF. BNP elevated. He reports LE edema has been increased. Presumably had a fair amount of IVF when admitted for AKI. Some of this may be driven by afib with RVR. Denies CP. BP is fine. Renal function is ok. Started cardizem for rate control. Lasix. I do no see an ECHO more recent than 15 years ago.     Final Clinical Impressions(s) / ED Diagnoses   Final diagnoses:  Dyspnea  Respiratory distress    ED Discharge Orders    None       Virgel Manifold, MD 09/20/17 1331

## 2017-09-14 NOTE — Plan of Care (Signed)
  Progressing Education: Knowledge of General Education information will improve 09/14/2017 2210 - Progressing by Talbert Forest, RN Nutrition: Adequate nutrition will be maintained 09/14/2017 2210 - Progressing by Talbert Forest, RN Coping: Level of anxiety will decrease 09/14/2017 2210 - Progressing by Talbert Forest, RN Pain Managment: General experience of comfort will improve 09/14/2017 2210 - Progressing by Talbert Forest, RN Safety: Ability to remain free from injury will improve 09/14/2017 2210 - Progressing by Talbert Forest, RN

## 2017-09-14 NOTE — ED Triage Notes (Signed)
Pt from home via EMS- Per EMS, pt started to have weakness today. Pt has had productive cough that started today. Pt being treated for colon ca but has not had chemo recently d/t sickness. Pt in NAD

## 2017-09-14 NOTE — Progress Notes (Signed)
ANTICOAGULATION CONSULT NOTE - Follow Up Consult  Pharmacy Consult for Warfarin Indication: atrial fibrillation  No Known Allergies  Patient Measurements: Height: 6\' 2"  (188 cm) Weight: 257 lb 0.9 oz (116.6 kg) IBW/kg (Calculated) : 82.2 Heparin Dosing Weight:   Vital Signs: Temp: 98.4 F (36.9 C) (11/10 2045) Temp Source: Oral (11/10 2045) BP: 89/56 (11/10 2045) Pulse Rate: 77 (11/10 2045)  Labs: Recent Labs    09/12/17 0356 09/13/17 0548 09/14/17 1628 09/14/17 1746  HGB 11.4* 11.2* 12.9*  --   HCT 34.8* 34.7* 39.9  --   PLT 190 207 291  --   LABPROT 88.8* 16.5*  --  19.5*  INR 11.58* 1.35  --  1.67  CREATININE 1.53* 1.14 1.10  --   TROPONINI  --   --  <0.03  --     Estimated Creatinine Clearance: 81.2 mL/min (by C-G formula based on SCr of 1.1 mg/dL).   Medications:  Medications Prior to Admission  Medication Sig Dispense Refill Last Dose  . ALPRAZolam (XANAX) 0.5 MG tablet Take 0.5 mg by mouth 3 (three) times daily as needed for anxiety.    09/14/2017 at 0800  . atorvastatin (LIPITOR) 80 MG tablet Take 80 mg by mouth every evening.    Past Week at Unknown time  . diltiazem (CARDIZEM CD) 300 MG 24 hr capsule Take 300 mg by mouth every evening.    Past Week at Unknown time  . dorzolamidel-timolol (COSOPT) 22.3-6.8 MG/ML SOLN ophthalmic solution Place 1 drop into both eyes 2 (two) times daily.   09/14/2017 at Unknown time  . lidocaine-prilocaine (EMLA) cream Apply to port site one hour prior to use. Do not rub in. Cover with plastic. 30 g 0 09/11/2017  . lisinopril (PRINIVIL,ZESTRIL) 10 MG tablet Take 10 mg by mouth daily.    09/14/2017 at 1000  . loperamide (IMODIUM) 2 MG capsule Take 1 capsule (2 mg total) every 6 (six) hours as needed by mouth for diarrhea or loose stools. 20 capsule 0 09/14/2017 at 1400  . metoprolol (LOPRESSOR) 50 MG tablet Take 25 mg by mouth 2 (two) times daily.    09/14/2017 at 1000  . ondansetron (ZOFRAN) 8 MG tablet Take 1 tablet (8 mg  total) every 8 (eight) hours as needed by mouth for nausea or vomiting. 20 tablet 1 09/13/2017 at Unknown time  . potassium chloride SA (K-DUR,KLOR-CON) 20 MEQ tablet Take 2 tablets (40 mEq total) 2 (two) times daily by mouth. 30 tablet 0 09/14/2017 at 0800  . prochlorperazine (COMPAZINE) 10 MG tablet Take 1 tablet (10 mg total) by mouth every 6 (six) hours as needed for nausea or vomiting. 30 tablet 1 09/14/2017 at 0600  . warfarin (COUMADIN) 1 MG tablet Take 1 tablet (1 mg total) daily at 6 PM by mouth. 10 tablet 0 09/13/2017 at Unknown time  . nystatin (MYCOSTATIN) 100000 UNIT/ML suspension Take 5 mLs by mouth 4 (four) times daily as needed (for oral thrush).    unknown   Scheduled:  . atorvastatin  80 mg Oral QPM  . chlorhexidine  15 mL Mouth Rinse BID  . dextromethorphan-guaiFENesin  1 tablet Oral BID  . diltiazem  300 mg Oral QPM  . dorzolamide  1 drop Both Eyes BID   And  . timolol  1 drop Both Eyes BID  . [START ON 09/15/2017] lisinopril  10 mg Oral Daily  . [START ON 09/15/2017] mouth rinse  15 mL Mouth Rinse q12n4p  . metoprolol tartrate  25 mg Oral  BID  . potassium chloride SA  40 mEq Oral BID  . potassium chloride  60 mEq Oral Once  . warfarin  1 mg Oral NOW  . [START ON 09/15/2017] Warfarin - Pharmacist Dosing Inpatient   Does not apply q1800    Assessment: Patient recently followed by pharmacy for warfarin dosing.  Last dose noted 11/9 per med rec.  Goal of Therapy:  INR 2-3    Plan:  Warfarin 1mg  po x1 now Daily INR  [name] 09/14/2017,9:28 PM

## 2017-09-14 NOTE — ED Notes (Signed)
RN may call report to Sharyn Lull [phone] @ 20:10

## 2017-09-15 ENCOUNTER — Other Ambulatory Visit (HOSPITAL_COMMUNITY): Payer: Medicare HMO

## 2017-09-15 ENCOUNTER — Inpatient Hospital Stay (HOSPITAL_COMMUNITY): Payer: Medicare HMO

## 2017-09-15 DIAGNOSIS — J9601 Acute respiratory failure with hypoxia: Secondary | ICD-10-CM

## 2017-09-15 DIAGNOSIS — J189 Pneumonia, unspecified organism: Secondary | ICD-10-CM

## 2017-09-15 DIAGNOSIS — R579 Shock, unspecified: Secondary | ICD-10-CM

## 2017-09-15 DIAGNOSIS — A419 Sepsis, unspecified organism: Principal | ICD-10-CM

## 2017-09-15 DIAGNOSIS — N179 Acute kidney failure, unspecified: Secondary | ICD-10-CM

## 2017-09-15 LAB — CBC
HCT: 33.8 % — ABNORMAL LOW (ref 39.0–52.0)
HEMOGLOBIN: 10.9 g/dL — AB (ref 13.0–17.0)
MCH: 25.5 pg — AB (ref 26.0–34.0)
MCHC: 32.2 g/dL (ref 30.0–36.0)
MCV: 79 fL (ref 78.0–100.0)
PLATELETS: 266 10*3/uL (ref 150–400)
RBC: 4.28 MIL/uL (ref 4.22–5.81)
RDW: 21 % — ABNORMAL HIGH (ref 11.5–15.5)
WBC: 33.5 10*3/uL — ABNORMAL HIGH (ref 4.0–10.5)

## 2017-09-15 LAB — BLOOD GAS, ARTERIAL
Acid-base deficit: 10.6 mmol/L — ABNORMAL HIGH (ref 0.0–2.0)
BICARBONATE: 13.9 mmol/L — AB (ref 20.0–28.0)
Drawn by: 33147
O2 CONTENT: 4 L/min
O2 Saturation: 93.6 %
PATIENT TEMPERATURE: 98.6
PCO2 ART: 27.5 mmHg — AB (ref 32.0–48.0)
PO2 ART: 73.4 mmHg — AB (ref 83.0–108.0)
pH, Arterial: 7.324 — ABNORMAL LOW (ref 7.350–7.450)

## 2017-09-15 LAB — COMPREHENSIVE METABOLIC PANEL
ALBUMIN: 2 g/dL — AB (ref 3.5–5.0)
ALK PHOS: 79 U/L (ref 38–126)
ALT: 23 U/L (ref 17–63)
ANION GAP: 8 (ref 5–15)
AST: 21 U/L (ref 15–41)
BUN: 26 mg/dL — AB (ref 6–20)
CO2: 16 mmol/L — AB (ref 22–32)
CREATININE: 2.53 mg/dL — AB (ref 0.61–1.24)
Calcium: 8.1 mg/dL — ABNORMAL LOW (ref 8.9–10.3)
Chloride: 116 mmol/L — ABNORMAL HIGH (ref 101–111)
GFR calc non Af Amer: 24 mL/min — ABNORMAL LOW (ref >60)
GFR, EST AFRICAN AMERICAN: 27 mL/min — AB (ref >60)
Glucose, Bld: 141 mg/dL — ABNORMAL HIGH (ref 65–99)
POTASSIUM: 3.7 mmol/L (ref 3.5–5.1)
SODIUM: 140 mmol/L (ref 135–145)
Total Bilirubin: 1 mg/dL (ref 0.3–1.2)
Total Protein: 5 g/dL — ABNORMAL LOW (ref 6.5–8.1)

## 2017-09-15 LAB — BLOOD GAS, VENOUS
ACID-BASE DEFICIT: 19 mmol/L — AB (ref 0.0–2.0)
Bicarbonate: 7.9 mmol/L — ABNORMAL LOW (ref 20.0–28.0)
O2 SAT: 35.8 %
PATIENT TEMPERATURE: 98.6
pCO2, Ven: 22.3 mmHg — ABNORMAL LOW (ref 44.0–60.0)
pH, Ven: 7.175 — CL (ref 7.250–7.430)

## 2017-09-15 LAB — CBC WITH DIFFERENTIAL/PLATELET
BASOS ABS: 0 10*3/uL (ref 0.0–0.1)
BASOS PCT: 0 %
EOS ABS: 0 10*3/uL (ref 0.0–0.7)
Eosinophils Relative: 0 %
HCT: 34.4 % — ABNORMAL LOW (ref 39.0–52.0)
HEMOGLOBIN: 10.8 g/dL — AB (ref 13.0–17.0)
LYMPHS ABS: 1.3 10*3/uL (ref 0.7–4.0)
Lymphocytes Relative: 4 %
MCH: 24.9 pg — AB (ref 26.0–34.0)
MCHC: 31.4 g/dL (ref 30.0–36.0)
MCV: 79.3 fL (ref 78.0–100.0)
MONO ABS: 3.2 10*3/uL — AB (ref 0.1–1.0)
Monocytes Relative: 10 %
NEUTROS ABS: 27.1 10*3/uL — AB (ref 1.7–7.7)
Neutrophils Relative %: 86 %
PLATELETS: 263 10*3/uL (ref 150–400)
RBC: 4.34 MIL/uL (ref 4.22–5.81)
RDW: 20.7 % — AB (ref 11.5–15.5)
WBC: 31.6 10*3/uL — ABNORMAL HIGH (ref 4.0–10.5)

## 2017-09-15 LAB — CORTISOL: CORTISOL PLASMA: 44.4 ug/dL

## 2017-09-15 LAB — BASIC METABOLIC PANEL
ANION GAP: 9 (ref 5–15)
BUN: 25 mg/dL — ABNORMAL HIGH (ref 6–20)
CALCIUM: 8.2 mg/dL — AB (ref 8.9–10.3)
CO2: 16 mmol/L — AB (ref 22–32)
CREATININE: 2.36 mg/dL — AB (ref 0.61–1.24)
Chloride: 114 mmol/L — ABNORMAL HIGH (ref 101–111)
GFR, EST AFRICAN AMERICAN: 30 mL/min — AB (ref >60)
GFR, EST NON AFRICAN AMERICAN: 26 mL/min — AB (ref >60)
GLUCOSE: 136 mg/dL — AB (ref 65–99)
Potassium: 3.5 mmol/L (ref 3.5–5.1)
Sodium: 139 mmol/L (ref 135–145)

## 2017-09-15 LAB — PROTIME-INR
INR: 2.1
INR: 2.37
PROTHROMBIN TIME: 23.4 s — AB (ref 11.4–15.2)
Prothrombin Time: 25.7 seconds — ABNORMAL HIGH (ref 11.4–15.2)

## 2017-09-15 LAB — APTT
APTT: 43 s — AB (ref 24–36)
APTT: 42 s — AB (ref 24–36)

## 2017-09-15 LAB — C DIFFICILE QUICK SCREEN W PCR REFLEX
C DIFFICILE (CDIFF) TOXIN: NEGATIVE
C DIFFICLE (CDIFF) ANTIGEN: NEGATIVE
C Diff interpretation: NEGATIVE

## 2017-09-15 LAB — STREP PNEUMONIAE URINARY ANTIGEN: STREP PNEUMO URINARY ANTIGEN: NEGATIVE

## 2017-09-15 LAB — TROPONIN I: Troponin I: 0.04 ng/mL (ref <0.03)

## 2017-09-15 LAB — HEPARIN LEVEL (UNFRACTIONATED): HEPARIN UNFRACTIONATED: 0.19 [IU]/mL — AB (ref 0.30–0.70)

## 2017-09-15 LAB — LACTIC ACID, PLASMA
Lactic Acid, Venous: 1.3 mmol/L (ref 0.5–1.9)
Lactic Acid, Venous: 1.2 mmol/L (ref 0.5–1.9)

## 2017-09-15 LAB — MRSA PCR SCREENING: MRSA by PCR: NEGATIVE

## 2017-09-15 LAB — PROCALCITONIN: Procalcitonin: 2.14 ng/mL

## 2017-09-15 MED ORDER — PHENYLEPHRINE HCL-NACL 10-0.9 MG/250ML-% IV SOLN
0.0000 ug/min | INTRAVENOUS | Status: DC
  Administered 2017-09-15: 80 ug/min via INTRAVENOUS
  Administered 2017-09-15: 40 ug/min via INTRAVENOUS
  Filled 2017-09-15 (×2): qty 250

## 2017-09-15 MED ORDER — HEPARIN (PORCINE) IN NACL 100-0.45 UNIT/ML-% IJ SOLN
1800.0000 [IU]/h | INTRAMUSCULAR | Status: DC
  Administered 2017-09-15: 1500 [IU]/h via INTRAVENOUS
  Administered 2017-09-16 (×2): 1650 [IU]/h via INTRAVENOUS
  Filled 2017-09-15 (×3): qty 250

## 2017-09-15 MED ORDER — CHLORHEXIDINE GLUCONATE CLOTH 2 % EX PADS
6.0000 | MEDICATED_PAD | Freq: Every day | CUTANEOUS | Status: DC
  Administered 2017-09-16 – 2017-09-17 (×2): 6 via TOPICAL

## 2017-09-15 MED ORDER — AMIODARONE HCL IN DEXTROSE 360-4.14 MG/200ML-% IV SOLN: 30.0000 mg/h | INTRAVENOUS | Status: DC

## 2017-09-15 MED ORDER — SODIUM CHLORIDE 0.9 % IV SOLN
0.0000 ug/min | INTRAVENOUS | Status: DC
  Administered 2017-09-15: 110 ug/min via INTRAVENOUS
  Administered 2017-09-15: 140 ug/min via INTRAVENOUS
  Administered 2017-09-16: 150 ug/min via INTRAVENOUS
  Administered 2017-09-16: 210 ug/min via INTRAVENOUS
  Administered 2017-09-16: 160 ug/min via INTRAVENOUS
  Filled 2017-09-15 (×7): qty 4

## 2017-09-15 MED ORDER — SODIUM BICARBONATE 8.4 % IV SOLN
100.0000 meq | INTRAVENOUS | Status: AC
  Administered 2017-09-15: 100 meq via INTRAVENOUS
  Filled 2017-09-15: qty 100

## 2017-09-15 MED ORDER — LACTATED RINGERS IV SOLN
INTRAVENOUS | Status: DC
  Administered 2017-09-15 – 2017-09-16 (×2): via INTRAVENOUS

## 2017-09-15 MED ORDER — AMIODARONE LOAD VIA INFUSION
150.0000 mg | Freq: Once | INTRAVENOUS | Status: DC
  Filled 2017-09-15: qty 83.34

## 2017-09-15 MED ORDER — SODIUM BICARBONATE 8.4 % IV SOLN
INTRAVENOUS | Status: DC
  Administered 2017-09-15 – 2017-09-16 (×2): via INTRAVENOUS
  Filled 2017-09-15 (×2): qty 150

## 2017-09-15 MED ORDER — POTASSIUM CHLORIDE 10 MEQ/100ML IV SOLN: 10.0000 meq | INTRAVENOUS | Status: DC

## 2017-09-15 MED ORDER — POTASSIUM CHLORIDE 10 MEQ/100ML IV SOLN
10.0000 meq | INTRAVENOUS | Status: AC
  Administered 2017-09-15 (×4): 10 meq via INTRAVENOUS
  Filled 2017-09-15 (×4): qty 100

## 2017-09-15 MED ORDER — IPRATROPIUM-ALBUTEROL 0.5-2.5 (3) MG/3ML IN SOLN
3.0000 mL | Freq: Four times a day (QID) | RESPIRATORY_TRACT | Status: DC | PRN
  Administered 2017-09-16 – 2017-09-17 (×3): 3 mL via RESPIRATORY_TRACT
  Filled 2017-09-15 (×3): qty 3

## 2017-09-15 MED ORDER — AMIODARONE HCL IN DEXTROSE 360-4.14 MG/200ML-% IV SOLN: 60.0000 mg/h | INTRAVENOUS | Status: DC

## 2017-09-15 MED ORDER — LACTATED RINGERS IV BOLUS (SEPSIS)
1000.0000 mL | Freq: Once | INTRAVENOUS | Status: AC
  Administered 2017-09-15: 1000 mL via INTRAVENOUS

## 2017-09-15 MED ORDER — SODIUM CHLORIDE 0.9 % IV BOLUS (SEPSIS)
1000.0000 mL | Freq: Once | INTRAVENOUS | Status: AC
  Administered 2017-09-15: 1000 mL via INTRAVENOUS

## 2017-09-15 MED ORDER — FAMOTIDINE 20 MG PO TABS
20.0000 mg | ORAL_TABLET | Freq: Every day | ORAL | Status: DC
  Administered 2017-09-15 – 2017-09-23 (×8): 20 mg via ORAL
  Filled 2017-09-15 (×8): qty 1

## 2017-09-15 MED ORDER — SODIUM CHLORIDE 0.9 % IV BOLUS (SEPSIS)
500.0000 mL | Freq: Once | INTRAVENOUS | Status: AC
  Administered 2017-09-15: 500 mL via INTRAVENOUS

## 2017-09-15 MED ORDER — VANCOMYCIN HCL IN DEXTROSE 1-5 GM/200ML-% IV SOLN: 1000.0000 mg | INTRAVENOUS | Status: DC

## 2017-09-15 NOTE — Progress Notes (Addendum)
PT Cancellation Note  Patient Details Name: [name] MRN: [id number] DOB: 1946-06-25   Cancelled Treatment:    Reason Eval/Treat Not Completed: Medical issues which prohibited therapy--low BP. Will hold PT for now and check back another day/time. Thanks.   Addendum: Pt transferred to ICU this am. Will hold PT for today.    Weston Anna, MPT Pager: [phone]

## 2017-09-15 NOTE — Progress Notes (Signed)
Received report; during rounding pt lethargic, Respond to verbal questions and answers appropriately. From 0750- 0930-Pt on 5l sat 95 %, pt noted to de-sat when off O2 to 89-90% and have period of apnea 2-3 sec, respond to verbal calls. Pt bilateral congested upper airway and coughing thick green to yellow/tan secretion noted via suction canister. MD on unit rounding, 250 bolus completed as previously ordered---checked BP 72/46  Pulse 64 and O2 5 liters 95%. Pt had inct episode of stool and urine. Preparing to transfer pt to CCU, RR-RN arrived to assist/support --transfer pt to 1223. MD remain at bedside. Additional 1000 ml bolus order and started. Pt tranferred at 0920 Report to CCU- RN. Family remain with  pt. SRP,RN

## 2017-09-15 NOTE — Progress Notes (Signed)
Patient ID: [name], male   DOB: Aug 22, 1946, 71 y.o.   MRN: [id number]    PROGRESS NOTE    [name]  IFO:[id number] DOB: 02-02-1946 DOA: 09/14/2017  PCP: [name], MD   Brief Narrative:  Patient is 71 year old male with known history of colon cancer on chemotherapy, hypertension, hyperlipidemia, atrial fibrillation on Coumadin, just recently admitted from 11/07 to 11/09 with acute renal failure suspected to be from dehydration and hyponatremia. Patient did well initially on discharge but in the past 24 hours started to feel more weak and tired, developed productive cough of clear and yellow sputum. Wife explained that patient has not been eating since discharge, she denies noticing any fevers or chills, no vomiting.  In emergency department, chest x-ray notable for developing predominantly lower lobe pneumonia, patient was started on vancomycin and Zosyn and TRH asked to admit for further evaluation. Of note, patient was started on Cardizem drip for atrial fibrillation.  Major events since admission: 11/11 - patient more hypotensive, somnolent, SBP in 70s, WBC up to 34K, patient transferred to SDU, PCCM consulted  Assessment & Plan:   Active Problems:   Sepsis due to pneumonia ([place]), LLL  - WBC up to 34 K this morning, SBP in 70's, concern for progressive severe sepsis, septic shock - Repeat sepsis protocol which includes blood cultures, pro calcitonin, lactic acid, CBC and BMP - Will also ask for repeat chest x-ray, strep pneumo, urine Legionella, sputum cultures - Patient is already on vancomycin and Zosyn which we will continue, day #2 - Continue with IV fluids boluses to support blood pressure - Transfer to stepdown unit - Critical care specialists consulted for assistance    Hypertension - Likely septic shock - Holding home blood pressure medications including lisinopril, metoprolol - Lasix was given in ED, no further lasix - monitor in SDU    Atrial fibrillation  with RVR - HR currently in 70's - discussed with cardiologist on call who will see pt - recommended starting amiodarone drip - will hold coumadin and place consult for pharmacy to dose heparin    Supratherapeutic INR - INR was 11 on 11/08 and pt has gotten one dose of Vit K, INR quickly dropped and on discharge 11/09 INR was 1.6 - will place order for heparin per pharmacy     Hypomagnesemia and hypokalemia - supplement and monitor     Colon cancer  - follows with Dr. Benay Spice - will notify of pt's admission     Obesity  - Body mass index is 33.26 kg/m.   DVT prophylaxis: Heparin  Code Status: Full  Family Communication: Patient and wife at bedside  Disposition Plan: transfer to SDU  Consultants:   PCCM  Cardiology   Procedures and diagnostic studies:  CXR 11/11 - Airspace consolidation consistent with pneumonia left base, increased from 1 day prior. Small left pleural effusion. Right lung clear.  CXR 11/10 - Patchy lower lobe predominant peribronchial airspace opacities may represent asymmetry pulmonary edema or developing bronchopneumonia.   Antimicrobials:   Vancomycin 11/10 -->  Zosyn 11/10  Microbiology data:  Blood cultures 11/10 -->  Blood cultures 11/11 -->  Sputum culture 11/11 -->  Strep pneumo 11/11 -->  Urine legionella 11/11 -->  Subjective: Pt more somnolent and increased work of breathing.   Objective: Vitals:   09/15/17 0500 09/15/17 0644 09/15/17 0752 09/15/17 0756  BP:  (!) 76/38 (!) 72/37 (!) 76/42  Pulse:  81 86 64  Resp:  (!) 22  Temp:  (!) 97.5 F (36.4 C)    TempSrc:  Oral    SpO2:  97% 96%   Weight: 117.5 kg (259 lb 0.7 oz)     Height:        Intake/Output Summary (Last 24 hours) at 09/15/2017 0905 Last data filed at 09/15/2017 0600 Gross per 24 hour  Intake 50 ml  Output 100 ml  Net -50 ml   Filed Weights   09/14/17 1618 09/14/17 2045 09/15/17 0500  Weight: 127 kg (280 lb) 116.6 kg (257 lb 0.9 oz) 117.5 kg (259  lb 0.7 oz)    Examination:  General exam: Appears somnolent but easy to awake, able to answer questions  Respiratory system: rhonchi on the left side, mild tachypnea Cardiovascular system: RRR. No JVD, rubs, gallops or clicks. Trace bilateral LE edema Gastrointestinal system: Abdomen is nondistended, soft and nontender. No organomegaly or masses felt. Normal bowel sounds heard. Central nervous system: somnolent but easy to awake, moving all 4 extremities  Skin: No rashes, lesions or ulcers  Data Reviewed: I have personally reviewed following labs and imaging studies  CBC: Recent Labs  Lab 09/11/17 0947 09/12/17 0356 09/13/17 0548 09/14/17 1628  WBC 23.6* 15.4* 14.3* 35.8*  NEUTROABS 18.6*  --  11.3* 30.5*  HGB 13.6 11.4* 11.2* 12.9*  HCT 40.9 34.8* 34.7* 39.9  MCV 76.4* 75.7* 76.4* 77.9*  PLT 181 190 207 542   Basic Metabolic Panel: Recent Labs  Lab 09/11/17 0949 09/12/17 0356 09/13/17 0548 09/14/17 1628  NA 131* 135 138 136  K 2.7 Repeated and Verified* 2.7* 2.9* 3.5  CL  --  109 115* 113*  CO2 13 Repeated and Verified* 17* 16* 14*  GLUCOSE 183* 120* 114* 176*  BUN 27.1* 28* 19 16  CREATININE 1.9* 1.53* 1.14 1.10  CALCIUM 8.8 8.2* 7.9* 8.6*  MG  --  1.5* 1.8 1.7  PHOS  --   --   --  2.9   Liver Function Tests: Recent Labs  Lab 09/11/17 0949 09/12/17 0356  AST 24 26  ALT 27 25  ALKPHOS 93 83  BILITOT 0.77 0.7  PROT 5.9* 5.2*  ALBUMIN 2.5* 2.4*   Coagulation Profile: Recent Labs  Lab 09/11/17 0947 09/11/17 1010 09/12/17 0356 09/13/17 0548 09/14/17 1746  INR Sent out for confirmation. See results in EPIC. 8.30* 11.58* 1.35 1.67  PROTIME Sent out for confirmation. See results in EPIC.  --   --   --   --    Cardiac Enzymes: Recent Labs  Lab 09/14/17 1628  TROPONINI <0.03   Urine analysis:    Component Value Date/Time   COLORURINE AMBER (A) 09/11/2017 2115   APPEARANCEUR CLOUDY (A) 09/11/2017 2115   LABSPEC 1.025 09/11/2017 2115   PHURINE  5.0 09/11/2017 2115   GLUCOSEU NEGATIVE 09/11/2017 2115   HGBUR NEGATIVE 09/11/2017 2115   BILIRUBINUR NEGATIVE 09/11/2017 2115   [place] NEGATIVE 09/11/2017 2115   PROTEINUR 100 (A) 09/11/2017 2115   NITRITE NEGATIVE 09/11/2017 2115   LEUKOCYTESUR NEGATIVE 09/11/2017 2115    Recent Results (from the past 240 hour(s))  Culture, blood (routine x 2)     Status: None (Preliminary result)   Collection Time: 09/11/17  6:43 PM  Result Value Ref Range Status   Specimen Description BLOOD LEFT ANTECUBITAL  Final   Special Requests   Final    BOTTLES DRAWN AEROBIC ONLY Blood Culture adequate volume   Culture   Final    NO GROWTH 3 DAYS Performed at [facility]  [facility], [place] [address] [place], [place] [address]    Report Status PENDING  Incomplete  Culture, blood (routine x 2)     Status: None (Preliminary result)   Collection Time: 09/11/17  6:55 PM  Result Value Ref Range Status   Specimen Description BLOOD RIGHT ANTECUBITAL  Final   Special Requests IN PEDIATRIC BOTTLE Blood Culture adequate volume  Final   Culture   Final    NO GROWTH 3 DAYS Performed at [facility], [place] [address] [place], [place] [address]    Report Status PENDING  Incomplete  Culture, expectorated sputum-assessment     Status: None   Collection Time: 09/14/17 10:34 PM  Result Value Ref Range Status   Specimen Description EXPECTORATED SPUTUM  Final   Special Requests Immunocompromised  Final   Sputum evaluation THIS SPECIMEN IS ACCEPTABLE FOR SPUTUM CULTURE  Final   Report Status 09/14/2017 FINAL  Final    Radiology Studies: Dg Chest Portable 1 View  Result Date: 09/14/2017 CLINICAL DATA:  Weakness and productive cough. EXAM: PORTABLE CHEST 1 VIEW COMPARISON:  09/11/2017 FINDINGS: Injectable port in stable position. Enlarged cardiac silhouette. Calcific atherosclerotic disease of the aorta. There is no evidence of pleural effusion or pneumothorax. Patchy lower lobe predominant peribronchial airspace  opacities. Osseous structures are without acute abnormality. Soft tissues are grossly normal. IMPRESSION: Patchy lower lobe predominant peribronchial airspace opacities may represent asymmetry pulmonary edema or developing bronchopneumonia. Electronically Signed   By: Fidela Salisbury M.D.   On: 09/14/2017 17:04    Scheduled Meds: . atorvastatin  80 mg Oral QPM  . chlorhexidine  15 mL Mouth Rinse BID  . dextromethorphan-guaiFENesin  1 tablet Oral BID  . diltiazem  300 mg Oral QPM  . dorzolamide  1 drop Both Eyes BID   And  . timolol  1 drop Both Eyes BID  . mouth rinse  15 mL Mouth Rinse q12n4p  . potassium chloride SA  40 mEq Oral BID  . Warfarin - Pharmacist Dosing Inpatient   Does not apply q1800   Continuous Infusions: . diltiazem (CARDIZEM) infusion Stopped (09/14/17 2016)  . piperacillin-tazobactam (ZOSYN)  IV 3.375 g (09/15/17 8841)  . potassium chloride    . sodium chloride    . vancomycin       LOS: 1 day    Time spent: 35 minutes    Faye Ramsay, MD [facility] Pager [phone]  If 7PM-7AM, please contact night-coverage [email or web address] Password [email or web address] 09/15/2017, 9:05 AM

## 2017-09-15 NOTE — Consult Note (Signed)
Reason for Consult:Afib  Referring Physician: Mart Piggs, MD  [name] is an 71 y.o. male.  HPI:   71 year old Caucasian male with history of hypertension, hyperlipidemia, obesity, chronic atrial fibrillation on warfarin for anticoagulation, colon cancer currently undergoing chemotherapy, was initially admitted to [facility] on 09/11/2017 with generalized weakness, fatigue and was treated for hypnotremia. He was readmitted on 09/14/2017 with cough, shortness of breath, fatigue. He now seems to have a left lung consolidation suspected for pneumonia. He has profound leukocytosis, hypotension not responding to initial fluid resuscitation, suspicious for sepsis. We were consulted regarding management of his A. Fib. He was initially given IV diltiazem infusion and bolus. Currently his and regular rate is 50-70 bpm. His INR has been very labile ranging from 1.3 to 11.5.  Echocardiogram 05/22/2017: LVEF 66%. Russell function can't be assessed due to A. fib. Moderate biatrial dilatation. Mild aortic regurgitation. Mild mitral regurgitation. PATIENT of mitral valve annulus. Moderate tricuspid regurgitation. Pulmonary artery systolic pressure 39 mmHg.  Lexiscan Myoview stress test 05/20/2017: Fixed defect suggesting area of soft tissue attenuation. Overall systolic function. Normal without regional wall motion abnormalities. EF 77%. Lower study.  Past Medical History:  Diagnosis Date  . Anxiety   . Dysrhythmia     a fib  . Hyperlipidemia   . Hypertension    managed  . Pneumonia 1950    Past Surgical History:  Procedure Laterality Date  . APPENDECTOMY  1950  . CARDIOVERSION    . EYE SURGERY Bilateral    catract surgery    Family History  Problem Relation Age of Onset  . Heart disease Father     Social History:  reports that  has never smoked. he has never used smokeless tobacco. He reports that he drinks alcohol. He reports that he does not use drugs.  Allergies: No  Known Allergies  Medications: I have reviewed the patient's current medications.  Results for orders placed or performed during the hospital encounter of 09/14/17 (from the past 48 hour(s))  Basic metabolic panel     Status: Abnormal   Collection Time: 09/14/17  4:28 PM  Result Value Ref Range   Sodium 136 135 - 145 mmol/L   Potassium 3.5 3.5 - 5.1 mmol/L    Comment: DELTA CHECK NOTED REPEATED TO VERIFY    Chloride 113 (H) 101 - 111 mmol/L   CO2 14 (L) 22 - 32 mmol/L   Glucose, Bld 176 (H) 65 - 99 mg/dL   BUN 16 6 - 20 mg/dL   Creatinine, Ser 1.10 0.61 - 1.24 mg/dL   Calcium 8.6 (L) 8.9 - 10.3 mg/dL   GFR calc non Af Amer >60 >60 mL/min   GFR calc Af Amer >60 >60 mL/min    Comment: (NOTE) The eGFR has been calculated using the CKD EPI equation. This calculation has not been validated in all clinical situations. eGFR's persistently <60 mL/min signify possible Chronic Kidney Disease.    Anion gap 9 5 - 15  CBC with Differential     Status: Abnormal   Collection Time: 09/14/17  4:28 PM  Result Value Ref Range   WBC 35.8 (H) 4.0 - 10.5 K/uL    Comment: RESULT REPEATED AND VERIFIED   RBC 5.12 4.22 - 5.81 MIL/uL   Hemoglobin 12.9 (L) 13.0 - 17.0 g/dL   HCT 39.9 39.0 - 52.0 %   MCV 77.9 (L) 78.0 - 100.0 fL   MCH 25.2 (L) 26.0 - 34.0 pg   MCHC 32.3 30.0 -  36.0 g/dL   RDW 20.5 (H) 11.5 - 15.5 %   Platelets 291 150 - 400 K/uL   Neutrophils Relative % 85 %   Lymphocytes Relative 4 %   Monocytes Relative 11 %   Eosinophils Relative 0 %   Basophils Relative 0 %   Neutro Abs 30.5 (H) 1.7 - 7.7 K/uL   Lymphs Abs 1.4 0.7 - 4.0 K/uL   Monocytes Absolute 3.9 (H) 0.1 - 1.0 K/uL   Eosinophils Absolute 0.0 0.0 - 0.7 K/uL   Basophils Absolute 0.0 0.0 - 0.1 K/uL   Smear Review MORPHOLOGY UNREMARKABLE   Troponin I     Status: None   Collection Time: 09/14/17  4:28 PM  Result Value Ref Range   Troponin I <0.03 <0.03 ng/mL  Brain natriuretic peptide     Status: Abnormal   Collection  Time: 09/14/17  4:28 PM  Result Value Ref Range   B Natriuretic Peptide 505.8 (H) 0.0 - 100.0 pg/mL  Magnesium     Status: None   Collection Time: 09/14/17  4:28 PM  Result Value Ref Range   Magnesium 1.7 1.7 - 2.4 mg/dL  Phosphorus     Status: None   Collection Time: 09/14/17  4:28 PM  Result Value Ref Range   Phosphorus 2.9 2.5 - 4.6 mg/dL  Protime-INR     Status: Abnormal   Collection Time: 09/14/17  5:46 PM  Result Value Ref Range   Prothrombin Time 19.5 (H) 11.4 - 15.2 seconds   INR 1.67   Lactic acid, plasma     Status: None   Collection Time: 09/14/17  5:46 PM  Result Value Ref Range   Lactic Acid, Venous 1.4 0.5 - 1.9 mmol/L  Culture, expectorated sputum-assessment     Status: None   Collection Time: 09/14/17 10:34 PM  Result Value Ref Range   Specimen Description EXPECTORATED SPUTUM    Special Requests Immunocompromised    Sputum evaluation THIS SPECIMEN IS ACCEPTABLE FOR SPUTUM CULTURE    Report Status 09/14/2017 FINAL   Culture, respiratory (NON-Expectorated)     Status: None (Preliminary result)   Collection Time: 09/14/17 10:34 PM  Result Value Ref Range   Specimen Description EXPECTORATED SPUTUM    Special Requests Immunocompromised Reflexed from [id number]    Gram Stain      ABUNDANT WBC PRESENT, PREDOMINANTLY PMN FEW SQUAMOUS EPITHELIAL CELLS PRESENT MODERATE YEAST WITH PSEUDOHYPHAE MODERATE GRAM POSITIVE RODS RARE GRAM NEGATIVE RODS Performed at [facility], [address], [place], [place] [address]    Culture PENDING    Report Status PENDING   Basic metabolic panel     Status: Abnormal   Collection Time: 09/15/17  7:56 AM  Result Value Ref Range   Sodium 139 135 - 145 mmol/L   Potassium 3.5 3.5 - 5.1 mmol/L   Chloride 114 (H) 101 - 111 mmol/L   CO2 16 (L) 22 - 32 mmol/L   Glucose, Bld 136 (H) 65 - 99 mg/dL   BUN 25 (H) 6 - 20 mg/dL   Creatinine, Ser 2.36 (H) 0.61 - 1.24 mg/dL    Comment: DELTA CHECK NOTED   Calcium 8.2 (L) 8.9 - 10.3 mg/dL    GFR calc non Af Amer 26 (L) >60 mL/min   GFR calc Af Amer 30 (L) >60 mL/min    Comment: (NOTE) The eGFR has been calculated using the CKD EPI equation. This calculation has not been validated in all clinical situations. eGFR's persistently <60 mL/min signify possible  Chronic Kidney Disease.    Anion gap 9 5 - 15  CBC     Status: Abnormal   Collection Time: 09/15/17  7:56 AM  Result Value Ref Range   WBC 33.5 (H) 4.0 - 10.5 K/uL    Comment: ADJUSTED FOR NUCLEATED RBC'S REPEATED TO VERIFY WHITE COUNT CONFIRMED ON SMEAR    RBC 4.28 4.22 - 5.81 MIL/uL   Hemoglobin 10.9 (L) 13.0 - 17.0 g/dL   HCT 33.8 (L) 39.0 - 52.0 %   MCV 79.0 78.0 - 100.0 fL   MCH 25.5 (L) 26.0 - 34.0 pg   MCHC 32.2 30.0 - 36.0 g/dL   RDW 21.0 (H) 11.5 - 15.5 %   Platelets 266 150 - 400 K/uL    Comment: REPEATED TO VERIFY PLATELET COUNT CONFIRMED BY SMEAR SPECIMEN CHECKED FOR CLOTS   APTT     Status: Abnormal   Collection Time: 09/15/17  7:56 AM  Result Value Ref Range   aPTT 43 (H) 24 - 36 seconds    Comment:        IF BASELINE aPTT IS ELEVATED, SUGGEST PATIENT RISK ASSESSMENT BE USED TO DETERMINE APPROPRIATE ANTICOAGULANT THERAPY.   Protime-INR     Status: Abnormal   Collection Time: 09/15/17  7:56 AM  Result Value Ref Range   Prothrombin Time 23.4 (H) 11.4 - 15.2 seconds   INR 2.10   Comprehensive metabolic panel     Status: Abnormal   Collection Time: 09/15/17  9:51 AM  Result Value Ref Range   Sodium 140 135 - 145 mmol/L   Potassium 3.7 3.5 - 5.1 mmol/L   Chloride 116 (H) 101 - 111 mmol/L   CO2 16 (L) 22 - 32 mmol/L   Glucose, Bld 141 (H) 65 - 99 mg/dL   BUN 26 (H) 6 - 20 mg/dL   Creatinine, Ser 2.53 (H) 0.61 - 1.24 mg/dL   Calcium 8.1 (L) 8.9 - 10.3 mg/dL   Total Protein 5.0 (L) 6.5 - 8.1 g/dL   Albumin 2.0 (L) 3.5 - 5.0 g/dL   AST 21 15 - 41 U/L   ALT 23 17 - 63 U/L   Alkaline Phosphatase 79 38 - 126 U/L   Total Bilirubin 1.0 0.3 - 1.2 mg/dL   GFR calc non Af Amer 24 (L) >60 mL/min    GFR calc Af Amer 27 (L) >60 mL/min    Comment: (NOTE) The eGFR has been calculated using the CKD EPI equation. This calculation has not been validated in all clinical situations. eGFR's persistently <60 mL/min signify possible Chronic Kidney Disease.    Anion gap 8 5 - 15  Lactic acid, plasma     Status: None   Collection Time: 09/15/17  9:51 AM  Result Value Ref Range   Lactic Acid, Venous 1.3 0.5 - 1.9 mmol/L  Procalcitonin     Status: None   Collection Time: 09/15/17  9:51 AM  Result Value Ref Range   Procalcitonin 2.14 ng/mL    Comment:        Interpretation: PCT > 2 ng/mL: Systemic infection (sepsis) is likely, unless other causes are known. (NOTE)         ICU PCT Algorithm               Non ICU PCT Algorithm    ----------------------------     ------------------------------         PCT < 0.25 ng/mL  PCT < 0.1 ng/mL     Stopping of antibiotics            Stopping of antibiotics       strongly encouraged.               strongly encouraged.    ----------------------------     ------------------------------       PCT level decrease by               PCT < 0.25 ng/mL       >= 80% from peak PCT       OR PCT 0.25 - 0.5 ng/mL          Stopping of antibiotics                                             encouraged.     Stopping of antibiotics           encouraged.    ----------------------------     ------------------------------       PCT level decrease by              PCT >= 0.25 ng/mL       < 80% from peak PCT        AND PCT >= 0.5 ng/mL            Continuing antibiotics                                               encouraged.       Continuing antibiotics            encouraged.    ----------------------------     ------------------------------     PCT level increase compared          PCT > 0.5 ng/mL         with peak PCT AND          PCT >= 0.5 ng/mL             Escalation of antibiotics                                          strongly encouraged.       Escalation of antibiotics        strongly encouraged.   Protime-INR     Status: Abnormal   Collection Time: 09/15/17  9:51 AM  Result Value Ref Range   Prothrombin Time 25.7 (H) 11.4 - 15.2 seconds   INR 2.37   APTT     Status: Abnormal   Collection Time: 09/15/17  9:51 AM  Result Value Ref Range   aPTT 42 (H) 24 - 36 seconds    Comment:        IF BASELINE aPTT IS ELEVATED, SUGGEST PATIENT RISK ASSESSMENT BE USED TO DETERMINE APPROPRIATE ANTICOAGULANT THERAPY.   Troponin I     Status: Abnormal   Collection Time: 09/15/17  9:51 AM  Result Value Ref Range   Troponin I 0.04 (HH) <0.03 ng/mL    Comment: CRITICAL RESULT CALLED TO, READ BACK BY AND VERIFIED WITH: FRANKLIN,S AT 11:10AM ON 09/15/17 BY Derrill Memo  Dg Chest Port 1 View  Result Date: 09/15/2017 CLINICAL DATA:  Shortness of Breath EXAM: PORTABLE CHEST 1 VIEW COMPARISON:  September 14, 2017 FINDINGS: There is airspace consolidation in the left lower lobe with small left pleural effusion. Right lung is clear. Heart is mildly enlarged with pulmonary vascularity within normal limits. No adenopathy. Port-A-Cath tip is in superior vena cava. There is aortic atherosclerosis. No bone lesions. IMPRESSION: Airspace consolidation consistent with pneumonia left base, increased from 1 day prior. Small left pleural effusion. Right lung clear. Stable cardiac prominence.  There is aortic atherosclerosis. Aortic Atherosclerosis (ICD10-I70.0). Electronically Signed   By: Lowella Grip III M.D.   On: 09/15/2017 09:48   Dg Chest Portable 1 View  Result Date: 09/14/2017 CLINICAL DATA:  Weakness and productive cough. EXAM: PORTABLE CHEST 1 VIEW COMPARISON:  09/11/2017 FINDINGS: Injectable port in stable position. Enlarged cardiac silhouette. Calcific atherosclerotic disease of the aorta. There is no evidence of pleural effusion or pneumothorax. Patchy lower lobe predominant peribronchial airspace opacities. Osseous structures are without  acute abnormality. Soft tissues are grossly normal. IMPRESSION: Patchy lower lobe predominant peribronchial airspace opacities may represent asymmetry pulmonary edema or developing bronchopneumonia. Electronically Signed   By: Fidela Salisbury M.D.   On: 09/14/2017 17:04    EKG 09/14/2017: Atrial fibrillation with rapid ventricular response. Normal axis. Nonspecific lateral ST-T changes. PVC.  Review of Systems  Constitutional: Positive for malaise/fatigue.  HENT: Negative.   Respiratory: Positive for cough, sputum production and shortness of breath.   Cardiovascular: Negative for chest pain, palpitations and leg swelling.  Gastrointestinal: Positive for diarrhea.  Genitourinary: Negative.   Musculoskeletal: Negative.   Skin: Negative.   Neurological: Positive for weakness. Negative for seizures and loss of consciousness.  Endo/Heme/Allergies: Negative.   All other systems reviewed and are negative.  Blood pressure (!) 85/31, pulse 61, temperature 97.7 F (36.5 C), temperature source Oral, resp. rate (!) 24, height _0  (1.88 m), weight 117.5 kg (259 lb 0.7 oz), SpO2 100 %. Physical Exam  Nursing note and vitals reviewed. Constitutional: He is oriented to person, place, and time. He appears well-developed. He appears distressed.  HENT:  Head: Normocephalic and atraumatic.  Eyes: Conjunctivae are normal. Pupils are equal, round, and reactive to light.  Neck: No JVD present.  Cardiovascular: Normal rate.  No murmur heard. Afib  Respiratory: He is in respiratory distress. He has wheezes.  Increased work of brething  GI: Soft. Bowel sounds are normal. He exhibits no distension.  Musculoskeletal: He exhibits edema.  Neurological: He is alert and oriented to person, place, and time.  Skin: Skin is warm and dry.    Assessment: Atrial fibrillation CHA2DS2VASc score 2, moderate stroke risk. On warfarin at home.  Severe sepsis History of hypertension Hyper lipidemia History of  colon cancer on chemotherapy  Recommendations: Agree with early goal-directed management for sepsis as you are doing. Continue heparin for anticoagulation. Given relatively slow her heart rate, will hold any beta blocker and calcium channel blocker at this time. Will also hold amiodarone at this point. If A. fib with RVR and ensues in the setting of sepsis and hypotension, consider amiodarone. If continues to be in A. fib RVR with hypotension, may require cardioversion. I suspect his relative bradycardia at this time is also related to sepsis. I have discussed this with critical care physician Dr. Chase Caller.   Shaolin Armas J Nathanal Hermiz 09/15/2017, 11:18 AM   [place], MD [facility] Pager: [phone] Office: [phone] If no answer Cell  [phone]

## 2017-09-15 NOTE — Procedures (Signed)
Arterial Catheter Insertion Procedure Note AYCE PIETRZYK [id number] 1946-01-14  Procedure: Insertion of Arterial Catheter  Indications: Blood pressure monitoring and Frequent blood sampling  Procedure Details Consent: Risks of procedure as well as the alternatives and risks of each were explained to the (patient/caregiver).  Consent for procedure obtained. Time Out: Verified patient identification, verified procedure, site/side was marked, verified correct patient position, special equipment/implants available, medications/allergies/relevent history reviewed, required imaging and test results available.  Performed  Maximum sterile technique was used including antiseptics, cap, gloves, gown, hand hygiene, mask and sheet. Skin prep: Chlorhexidine; local anesthetic administered 20 gauge catheter was inserted into right radial artery using the Seldinger technique.  Evaluation Blood flow good; BP tracing good. Complications: No apparent complications.   [name] 09/15/2017

## 2017-09-15 NOTE — Progress Notes (Signed)
Pharmacy Antibiotic Note  [name] is a 71 y.o. male admitted on 09/14/2017 with pneumonia.  Pharmacy has been consulted for Vancomycin and Zosyn dosing.  Plan: Zosyn 3.375g IV q8h (4 hour infusion).   Vancomycin 2gm iv x1, then 1gm q24, Goal AUC = 400 - 500 for all indications, except meningitis (goal AUC > 500 and Cmin 15-20 mcg/mL)   Height: 6\' 2"  (188 cm) Weight: 259 lb 0.7 oz (117.5 kg) IBW/kg (Calculated) : 82.2  Temp (24hrs), Avg:98 F (36.7 C), Min:97.5 F (36.4 C), Max:98.4 F (36.9 C)  Recent Labs  Lab 09/11/17 0947 09/11/17 0949 09/12/17 0356 09/13/17 0548 09/14/17 1628 09/14/17 1746 09/15/17 0756 09/15/17 0951  WBC 23.6*  --  15.4* 14.3* 35.8*  --  33.5*  --   CREATININE  --  1.9* 1.53* 1.14 1.10  --  2.36*  --   LATICACIDVEN  --   --   --   --   --  1.4  --  1.3    Estimated Creatinine Clearance: 38 mL/min (A) (by C-G formula based on SCr of 2.36 mg/dL (H)).    No Known Allergies  Antimicrobials this admission: Vancomycin 09/15/2017 >> Zosyn 09/15/2017 >>   Dose adjustments this admission: Vanc empirically changed from 1250mg  q12 to 1gm q24 for worsening SCr  Microbiology results: Previous admit: 11/7 BCx: ngtd  11/10 Sputum: sent, acceptable  11/11 BCx x1 set: sent   Thank you for allowing pharmacy to be a part of this patient's care.  Minda Ditto PharmD Pager [phone] 09/15/2017, 11:07 AM

## 2017-09-15 NOTE — Progress Notes (Signed)
ANTICOAGULATION CONSULT NOTE   Pharmacy Consult for Heparin Indication: atrial fibrillation  No Known Allergies  Patient Measurements: Height: 6\' 2"  (188 cm) Weight: 259 lb 0.7 oz (117.5 kg) IBW/kg (Calculated) : 82.2 HEPARIN DW (KG): 106.9  Vital Signs: Temp: 97.5 F (36.4 C) (11/11 1200) Temp Source: Oral (11/11 1200) BP: 120/42 (11/11 1700) Pulse Rate: 78 (11/11 1730)  Labs: Recent Labs    09/14/17 1628 09/14/17 1746 09/15/17 0756 09/15/17 0951 09/15/17 1652  HGB 12.9*  --  10.9* 10.8*  --   HCT 39.9  --  33.8* 34.4*  --   PLT 291  --  266 263  --   APTT  --   --  43* 42*  --   LABPROT  --  19.5* 23.4* 25.7*  --   INR  --  1.67 2.10 2.37  --   HEPARINUNFRC  --   --   --   --  0.19*  CREATININE 1.10  --  2.36* 2.53*  --   TROPONINI <0.03  --   --  0.04*  --    Estimated Creatinine Clearance: 35.4 mL/min (A) (by C-G formula based on SCr of 2.53 mg/dL (H)).  Medications:   Scheduled:  . chlorhexidine  15 mL Mouth Rinse BID  . dorzolamide  1 drop Both Eyes BID   And  . timolol  1 drop Both Eyes BID  . famotidine  20 mg Oral QHS  . mouth rinse  15 mL Mouth Rinse q12n4p  . potassium chloride SA  40 mEq Oral BID   Assessment: 71 yoM readmitted 11/10 pm for worsening SOB, non-productive cough, LE edema. Prev admit 11/7-9 for hypoNa/AKI. Treating for HCAP and Afib.  Hx: Colon Ca, HTN, HPL Afib on Warfarin, LD 11/9, home dose 2mg  daily, was d/c on 1mg  daily  Today, 09/15/2017 -INR therapeutic, but Coumadin d/c'd and heparin started to bridge while acutely ill earlier today -Initial heparin level sub-therapeutic (0.19), but drawn ~3hrs early -No bleeding or infusion related issues reported by RN  Goal of Therapy:  INR 2-3 Heparin level 0.3-0.7 units/ml Monitor platelets by anticoagulation protocol: Yes  Plan:   Increase Heparin infusion to 1650 units/hr  Check Hep level in 8 hrs  Daily CBC, daily Hep level when at steady state  Netta Cedars,  PharmD, BCPS 09/15/2017, 6:01 PM

## 2017-09-15 NOTE — Progress Notes (Signed)
Pharmacy Antibiotic Note  [name] is a 71 y.o. male admitted on 09/14/2017 with pneumonia.  Pharmacy has been consulted for Vancomycin and Zosyn dosing.  Addendum 13:42 SCr 1.10 yesterday 11/10 >> 2.36 >>2.53  Plan: Zosyn 3.375g IV q8h (4 hour infusion).  Vancomycin 2gm iv x1, then per levels with rapidly declining creatinine Vancomycin random level 0600 tomorrow, roughly 36 hr after 2gm loading dose  Height: 6\' 2"  (188 cm) Weight: 259 lb 0.7 oz (117.5 kg) IBW/kg (Calculated) : 82.2  Temp (24hrs), Avg:98 F (36.7 C), Min:97.5 F (36.4 C), Max:98.4 F (36.9 C)  Recent Labs  Lab 09/12/17 0356 09/13/17 0548 09/14/17 1628 09/14/17 1746 09/15/17 0756 09/15/17 0951  WBC 15.4* 14.3* 35.8*  --  33.5* 31.6*  CREATININE 1.53* 1.14 1.10  --  2.36* 2.53*  LATICACIDVEN  --   --   --  1.4  --  1.3    Estimated Creatinine Clearance: 35.4 mL/min (A) (by C-G formula based on SCr of 2.53 mg/dL (H)).    No Known Allergies  Antimicrobials this admission: Vancomycin 09/15/2017 >> Zosyn 09/15/2017 >>   Dose adjustments this admission: Vanc empirically changed from 1250mg  q12 to 1gm q24 for worsening SCr  Microbiology results: Previous admit: 11/7 BCx: ngtd  11/10 Sputum: sent, acceptable  11/11 BCx x1 set: sent  Thank you for allowing pharmacy to be a part of this patient's care.  Minda Ditto PharmD Pager [phone] 09/15/2017, 1:41 PM

## 2017-09-15 NOTE — Consult Note (Signed)
PULMONARY / CRITICAL CARE MEDICINE   Name: [name] MRN: [id number] DOB: 12/24/1945 PCP [name], MD LOS 1 as of 09/15/2017     ADMISSION DATE:  09/14/2017 CONSULTATION DATE:  09/15/2017   REFERRING MD:  Dr Susie Cassette of [facility]  CHIEF COMPLAINT:  Septic shock  HISTORY OF PRESENT ILLNESS:   71 year old male history is given by his wife, bedside nurse Judson Roch and also review of the chart  He has colon cancer undergoing chemotherapy.  He initially presented to the oncology clinic on September 11, 2017 with a history of diminished p.o. intake for 2 weeks and noticed to be hyponatremic with a creatinine of 1.9 mg percent and a BUN greater than 20 with a white count greater than 23,000 and a low potassium of 2.7.  When evaluated by hospitalist service on September 11, 2017 he denied any coughing, sputum production dysuria fevers or rashes or open sores.  Chemotherapy was held dehydration secondary to anorexia was suspected and IV fluid and electrolyte replacement was started.  At that time patient was normotensive.  History of atrial fibrillation that was rate controlled was also noted but his INR was slightly high.  He was then discharged from the emergency department  He represented on September 14, 2017.  Apparently initially he was improving after discharge from the emergency room on the seventh.  But he started developing congested and started having cough and weakness but denied any fever chills weakness dizziness chest pain shortness of breath abdominal pain dysuria or altered mental status.  This time he was admitted and upon admission it was felt that he might be in pulmonary congestion [he follows at [facility] cardiovascular with Dr. Einar Gip and his 2018 echo and cardiac stress test were normal according to history obtained today] he was given Lasix and also broad-spectrum antibiotics and placed on Cardizem drip for atrial fibrillation.  But subsequently has declined with tachypnea and also  circulatory hypotension not responding to initial small bolus of fluids and transferred to the intensive care unit.  Upon critical care medicine evaluation he admitted to fatigue and also was having diarrhea which the wife says is chronic because of colon cancer therapy.  His white count is high.  And chest x-ray more consistent with possible left lower lobe pneumonia  At this stage pulmonary medicine was consulted.  Mid review shows he is on ACE inhibitors  PAST MEDICAL HISTORY :  He  has a past medical history of Anxiety, Dysrhythmia, Hyperlipidemia, Hypertension, and Pneumonia (1950).  PAST SURGICAL HISTORY: He  has a past surgical history that includes Appendectomy (1950); Cardioversion; Eye surgery (Bilateral); INSERTION PORT-A-CATH (N/A, 07/02/2017); LAPAROSCOPIC PARTIAL COLECTOMY (N/A, 06/06/2017); and LAPAROSCOPIC VENTRAL HERNIA (N/A, 06/23/2013).  No Known Allergies  No current facility-administered medications on file prior to encounter.    Current Outpatient Medications on File Prior to Encounter  Medication Sig  . ALPRAZolam (XANAX) 0.5 MG tablet Take 0.5 mg by mouth 3 (three) times daily as needed for anxiety.   Marland Kitchen atorvastatin (LIPITOR) 80 MG tablet Take 80 mg by mouth every evening.   . diltiazem (CARDIZEM CD) 300 MG 24 hr capsule Take 300 mg by mouth every evening.   . dorzolamidel-timolol (COSOPT) 22.3-6.8 MG/ML SOLN ophthalmic solution Place 1 drop into both eyes 2 (two) times daily.  Marland Kitchen lidocaine-prilocaine (EMLA) cream Apply to port site one hour prior to use. Do not rub in. Cover with plastic.  Marland Kitchen lisinopril (PRINIVIL,ZESTRIL) 10 MG tablet Take 10 mg by mouth daily.   Marland Kitchen  loperamide (IMODIUM) 2 MG capsule Take 1 capsule (2 mg total) every 6 (six) hours as needed by mouth for diarrhea or loose stools.  . metoprolol (LOPRESSOR) 50 MG tablet Take 25 mg by mouth 2 (two) times daily.   . ondansetron (ZOFRAN) 8 MG tablet Take 1 tablet (8 mg total) every 8 (eight) hours as needed by  mouth for nausea or vomiting.  . potassium chloride SA (K-DUR,KLOR-CON) 20 MEQ tablet Take 2 tablets (40 mEq total) 2 (two) times daily by mouth.  . prochlorperazine (COMPAZINE) 10 MG tablet Take 1 tablet (10 mg total) by mouth every 6 (six) hours as needed for nausea or vomiting.  . warfarin (COUMADIN) 1 MG tablet Take 1 tablet (1 mg total) daily at 6 PM by mouth.  . nystatin (MYCOSTATIN) 100000 UNIT/ML suspension Take 5 mLs by mouth 4 (four) times daily as needed (for oral thrush).     FAMILY HISTORY:  His indicated that his mother is deceased. He indicated that his father is deceased.   SOCIAL HISTORY: He  reports that  has never smoked. he has never used smokeless tobacco. He reports that he drinks alcohol. He reports that he does not use drugs.  REVIEW OF SYSTEMS:   As per history of present illness otherwise unobtainable   VITAL SIGNS: BP (!) 85/31   Pulse 61   Temp 97.7 F (36.5 C) (Oral)   Resp (!) 24   Ht 6\' 2"  (1.88 m)   Wt 117.5 kg (259 lb 0.7 oz)   SpO2 100%   BMI 33.26 kg/m   HEMODYNAMICS:    VENTILATOR SETTINGS:    INTAKE / OUTPUT: I/O last 3 completed shifts: In: 28 [IV Piggyback:50] Out: 100 [Urine:100]     EXAM  General Appearance:    Looks criticall ill OBESE -yes.  Looks fatigued  Head:    Normocephalic, without obvious abnormality, atraumatic  Eyes:    PERRL -yes clear, conjunctiva/corneas - clear  Ears:    Normal external ear canals, both ears  Nose:   NG tube -no but has nasal cannula oxygen  Throat:  ETT TUBE -no, OG tube -no  Neck:   Supple,  No enlargement/tenderness/nodules     Lungs:     Clear to auscultation bilaterally, mildly tachypneic but not paradoxical.  Has a left infraclavicular Port-A-Cath with one port  Chest wall:    No deformity  Heart:    S1 and S2 normal, no murmur, CVP -no.  Pressors -on Neo-Synephrine 70 mcg and a heart rate of 70 in atrial fibrillation  Abdomen:     Soft, no masses, no organomegaly  Genitalia:    External genitalia looks normal.  There is a pool of liquid stool  Rectal:   not done  Extremities:   Extremities-no cyanosis no clubbing edema     Skin:   Intact in exposed areas .     Neurologic:   Sedation - none -> RASS --1. Moves all 4s -yes. CAM-ICU -unable to assess because of fatigue. Orientation -appears oriented but weak       LABS  PULMONARY No results for input(s): PHART, PCO2ART, PO2ART, HCO3, TCO2, O2SAT in the last 168 hours.  Invalid input(s): PCO2, PO2  CBC Recent Labs  Lab 09/13/17 0548 09/14/17 1628 09/15/17 0756  HGB 11.2* 12.9* 10.9*  HCT 34.7* 39.9 33.8*  WBC 14.3* 35.8* 33.5*  PLT 207 291 266    COAGULATION Recent Labs  Lab 09/12/17 0356 09/13/17 0548 09/14/17 1746 09/15/17 0756 09/15/17  0951  INR 11.58* 1.35 1.67 2.10 2.37    CARDIAC   Recent Labs  Lab 09/14/17 1628 09/15/17 0951  TROPONINI <0.03 0.04*   No results for input(s): PROBNP in the last 168 hours.   CHEMISTRY Recent Labs  Lab 09/12/17 0356 09/13/17 0548 09/14/17 1628 09/15/17 0756 09/15/17 0951  NA 135 138 136 139 140  K 2.7* 2.9* 3.5 3.5 3.7  CL 109 115* 113* 114* 116*  CO2 17* 16* 14* 16* 16*  GLUCOSE 120* 114* 176* 136* 141*  BUN 28* 19 16 25* 26*  CREATININE 1.53* 1.14 1.10 2.36* 2.53*  CALCIUM 8.2* 7.9* 8.6* 8.2* 8.1*  MG 1.5* 1.8 1.7  --   --   PHOS  --   --  2.9  --   --    Estimated Creatinine Clearance: 35.4 mL/min (A) (by C-G formula based on SCr of 2.53 mg/dL (H)).   LIVER Recent Labs  Lab 09/11/17 0949  09/12/17 0356 09/13/17 0548 09/14/17 1746 09/15/17 0756 09/15/17 0951  AST 24  --  26  --   --   --  21  ALT 27  --  25  --   --   --  23  ALKPHOS 93  --  83  --   --   --  79  BILITOT 0.77  --  0.7  --   --   --  1.0  PROT 5.9*  --  5.2*  --   --   --  5.0*  ALBUMIN 2.5*  --  2.4*  --   --   --  2.0*  INR  --    < > 11.58* 1.35 1.67 2.10 2.37   < > = values in this interval not displayed.     INFECTIOUS Recent Labs  Lab  09/14/17 1746 09/15/17 0951  LATICACIDVEN 1.4 1.3  PROCALCITON  --  2.14     ENDOCRINE CBG (last 3)  No results for input(s): GLUCAP in the last 72 hours.       IMAGING x48h  - image(s) personally visualized  -   highlighted in bold Dg Chest Port 1 View  Result Date: 09/15/2017 CLINICAL DATA:  Shortness of Breath EXAM: PORTABLE CHEST 1 VIEW COMPARISON:  September 14, 2017 FINDINGS: There is airspace consolidation in the left lower lobe with small left pleural effusion. Right lung is clear. Heart is mildly enlarged with pulmonary vascularity within normal limits. No adenopathy. Port-A-Cath tip is in superior vena cava. There is aortic atherosclerosis. No bone lesions. IMPRESSION: Airspace consolidation consistent with pneumonia left base, increased from 1 day prior. Small left pleural effusion. Right lung clear. Stable cardiac prominence.  There is aortic atherosclerosis. Aortic Atherosclerosis (ICD10-I70.0). Electronically Signed   By: Lowella Grip III M.D.   On: 09/15/2017 09:48   Dg Chest Portable 1 View  Result Date: 09/14/2017 CLINICAL DATA:  Weakness and productive cough. EXAM: PORTABLE CHEST 1 VIEW COMPARISON:  09/11/2017 FINDINGS: Injectable port in stable position. Enlarged cardiac silhouette. Calcific atherosclerotic disease of the aorta. There is no evidence of pleural effusion or pneumothorax. Patchy lower lobe predominant peribronchial airspace opacities. Osseous structures are without acute abnormality. Soft tissues are grossly normal. IMPRESSION: Patchy lower lobe predominant peribronchial airspace opacities may represent asymmetry pulmonary edema or developing bronchopneumonia. Electronically Signed   By: Fidela Salisbury M.D.   On: 09/14/2017 17:04       ASSESSMENT and PLAN  ASSESSMENT / PLAN:  PULMONARY A: #baseline: obese. ? OSA #current:  Mild resp distress  09/15/2017 -> at risk for intubation. Might be tachypneic due to GI loss of bicarbonate (bic  16 with normal lactate)  P:   Check abg Start bic gtt  CARDIOVASCULAR A:  #baseline: Atrial fibrillation on Cardizem at home.  Also hypertensive on lisinopril.  Normal echo and cardiac stress test in 2018 with Dr. Einar Gip  #current circulatory shock likely due to sepsis and GI volume loss and possible non-gap metabolic acidosis.  Slow atrial fibrillation    P:  Volume resuscitation Neo-Synephrine for mean arterial pressure goal greater than 65  RENAL  Intake/Output Summary (Last 24 hours) at 09/15/2017 1123 Last data filed at 09/15/2017 0600 Gross per 24 hour  Intake 50 ml  Output 100 ml  Net -50 ml   Recent Labs  Lab 09/12/17 0356 09/13/17 0548 09/14/17 1628 09/15/17 0756 09/15/17 0951  CREATININE 1.53* 1.14 1.10 2.36* 2.53*     A:   #baseline: Baseline creatinine 0.9 mg percent August 28, 2017 #current acute kidney injury/ATN [noted to be on ACE inhibitor].  Likely metabolic acidosis non-lactate  P Stop ACE inhibitor   Volume resuscitation Check ABG start bicarb infusion  GASTROINTESTINAL A:   Colon cancer undergoing chemotherapy and likely chronic diarrhea  P:   Hold laxative/Imodium Flexi-Seal H2 blockade but not PPI because of diarrhea Check stool for C. difficile and toxin PCR   HEMATOLOGIC Recent Labs  Lab 09/13/17 0548 09/14/17 1628 09/15/17 0756  HGB 11.2* 12.9* 10.9*  HCT 34.7* 39.9 33.8*  WBC 14.3* 35.8* 33.5*  PLT 207 291 266    A:   #RBC: Anemia of chronic disease and cancer #Platelet normal #WBC leukocytosis possibly due to sepsis  P:  - PRBC for hgb </= 6.9gm%    - exceptions are   -  if ACS susepcted/confirmed then transfuse for hgb </= 8.0gm%,  or    -  active bleeding with hemodynamic instability, then transfuse regardless of hemoglobin value   At at all times try to transfuse 1 unit prbc as possible with exception of active hemorrhage    INFECTIOUS Recent Labs  Lab 09/15/17 0951  PROCALCITON 2.14    Results  for orders placed or performed during the hospital encounter of 09/14/17  Culture, expectorated sputum-assessment     Status: None   Collection Time: 09/14/17 10:34 PM  Result Value Ref Range Status   Specimen Description EXPECTORATED SPUTUM  Final   Special Requests Immunocompromised  Final   Sputum evaluation THIS SPECIMEN IS ACCEPTABLE FOR SPUTUM CULTURE  Final   Report Status 09/14/2017 FINAL  Final  Culture, respiratory (NON-Expectorated)     Status: None (Preliminary result)   Collection Time: 09/14/17 10:34 PM  Result Value Ref Range Status   Specimen Description EXPECTORATED SPUTUM  Final   Special Requests Immunocompromised Reflexed from [id number]  Final   Gram Stain   Final    ABUNDANT WBC PRESENT, PREDOMINANTLY PMN FEW SQUAMOUS EPITHELIAL CELLS PRESENT MODERATE YEAST WITH PSEUDOHYPHAE MODERATE GRAM POSITIVE RODS RARE GRAM NEGATIVE RODS Performed at [facility], [address], [place], [place] [address]    Culture PENDING  Incomplete   Report Status PENDING  Incomplete    A:   Pro calcitonin profile suggestive of sepsis Pneumonia most likely source left lower lobe GI pathogen also possible  P: Check respiratory virus panel Broad antibiotics Rule out GI pathogen and stool Anti-infectives (From admission, onward)   Start     Dose/Rate Route Frequency Ordered Stop  09/15/17 2200  vancomycin (VANCOCIN) 1,250 mg in sodium chloride 0.9 % 250 mL IVPB  Status:  Discontinued     1,250 mg 166.7 mL/hr over 90 Minutes Intravenous Every 12 hours 09/14/17 2115 09/15/17 1053   09/15/17 2200  vancomycin (VANCOCIN) IVPB 1000 mg/200 mL premix     1,000 mg 200 mL/hr over 60 Minutes Intravenous Every 24 hours 09/15/17 1053     09/14/17 2300  piperacillin-tazobactam (ZOSYN) IVPB 3.375 g     3.375 g 12.5 mL/hr over 240 Minutes Intravenous Every 8 hours 09/14/17 2113     09/14/17 1715  piperacillin-tazobactam (ZOSYN) IVPB 3.375 g     3.375 g 100 mL/hr over 30 Minutes  Intravenous  Once 09/14/17 1708 09/14/17 1824   09/14/17 1715  vancomycin (VANCOCIN) 2,000 mg in sodium chloride 0.9 % 500 mL IVPB     2,000 mg 250 mL/hr over 120 Minutes Intravenous  Once 09/14/17 1711 09/14/17 2056       ENDOCRINE A:   At risk for hypoglycemia P:   ICU hyperglycemia protocol  NEUROLOGIC A:   #Baseline : Intact  #Current: Intact but physically deconditioned    P:   RASS goal: 0 As needed opioid hold   FAMILY  - Updates: 09/15/2017 --> wife updated at the bedside and discussed with Dr. Virgina Jock cardiology  - Inter-disciplinary family meet or Palliative Care meeting due by:  DAy 7. Current LOS is LOS 1 days   DISPO Keep in ICU          CODE STATUS    Code Status Orders  (From admission, onward)        Start     Ordered   09/14/17 2056  Full code  Continuous     09/14/17 2055    Code Status History    Date Active Date Inactive Code Status Order ID Comments User Context   09/11/2017 17:20 09/13/2017 14:11 Full Code [id number]  Donne Hazel, MD Inpatient   06/06/2017 14:14 06/11/2017 15:05 Full Code [id number]  Stark Klein, MD Inpatient    Advance Directive Documentation     Most Recent Value  Type of Advance Directive  Healthcare Power of Attorney, Living will  Pre-existing out of facility DNR order (yellow form or pink MOST form)  No data  "MOST" Form in Place?  No data        DISPO Keep in ICU      The patient is critically ill with multiple organ systems failure and requires high complexity decision making for assessment and support, frequent evaluation and titration of therapies, application of advanced monitoring technologies and extensive interpretation of multiple databases.   Critical Care Time devoted to patient care services described in this note is  35  Minutes. This time reflects time of care of this signee Dr Brand Males. This critical care time does not reflect procedure time, or teaching time or  supervisory time of PA/NP/Med student/Med Resident etc but could involve care discussion time    Dr. Brand Males, M.D., [facility].C.P Pulmonary and Critical Care Medicine Staff Physician [facility] Pager: [phone], If no answer or between  15:00h - 7:00h: call [phone]  09/15/2017 11:23 AM

## 2017-09-15 NOTE — Progress Notes (Signed)
Pt's BP 76/38 this morning. PA on call notified. Advised to page day shift MD to come see pt.

## 2017-09-15 NOTE — Progress Notes (Signed)
ANTICOAGULATION CONSULT NOTE   Pharmacy Consult for Heparin Indication: atrial fibrillation  No Known Allergies  Patient Measurements: Height: 6\' 2"  (188 cm) Weight: 259 lb 0.7 oz (117.5 kg) IBW/kg (Calculated) : 82.2 Heparin Dosing Weight:   Vital Signs: Temp: 97.7 F (36.5 C) (11/11 0915) Temp Source: Oral (11/11 0915) BP: 69/36 (11/11 1000) Pulse Rate: 70 (11/11 1000)  Labs: Recent Labs    09/13/17 0548 09/14/17 1628 09/14/17 1746 09/15/17 0756  HGB 11.2* 12.9*  --  10.9*  HCT 34.7* 39.9  --  33.8*  PLT 207 291  --  266  LABPROT 16.5*  --  19.5*  --   INR 1.35  --  1.67  --   CREATININE 1.14 1.10  --  2.36*  TROPONINI  --  <0.03  --   --    Estimated Creatinine Clearance: 38 mL/min (A) (by C-G formula based on SCr of 2.36 mg/dL (H)).  Medications:   Scheduled:  . amiodarone  150 mg Intravenous Once  . chlorhexidine  15 mL Mouth Rinse BID  . dextromethorphan-guaiFENesin  1 tablet Oral BID  . diltiazem  300 mg Oral QPM  . dorzolamide  1 drop Both Eyes BID   And  . timolol  1 drop Both Eyes BID  . mouth rinse  15 mL Mouth Rinse q12n4p  . potassium chloride SA  40 mEq Oral BID   Assessment: 89 yoM readmitted 11/10 pm for worsening SHOB, non-productive cough, LE edema. Prev admit 11/7-9 for hypoNa/AKI. Treating for HCAP and Afib  Hx: Colon Ca, HTN, HPL Afib on Warfarin, LD 11/9, home dose 2mg  daily, was d/c on 1mg  daily  Today, 09/15/2017 INR ordered this am, not yet resulted. INR 1.67 yesterday, patient received 1mg  Warfarin last night Change Warfarin to Heparin per Pharmacy  Goal of Therapy:  INR 2-3    Plan:   Begin Heparin infusion at 1500 units/hr, no load  Check 1st Hep level at 1800  Daily CBC, daily Hep level when at steady state  Discontinue daily INR for now  Minda Ditto PharmD Pager [phone] 09/15/2017, 11:03 AM

## 2017-09-16 ENCOUNTER — Telehealth: Payer: Self-pay | Admitting: Nurse Practitioner

## 2017-09-16 ENCOUNTER — Inpatient Hospital Stay (HOSPITAL_COMMUNITY): Payer: Medicare HMO

## 2017-09-16 DIAGNOSIS — R0902 Hypoxemia: Secondary | ICD-10-CM

## 2017-09-16 DIAGNOSIS — R6521 Severe sepsis with septic shock: Secondary | ICD-10-CM

## 2017-09-16 DIAGNOSIS — J90 Pleural effusion, not elsewhere classified: Secondary | ICD-10-CM

## 2017-09-16 DIAGNOSIS — L8990 Pressure ulcer of unspecified site, unspecified stage: Secondary | ICD-10-CM

## 2017-09-16 LAB — GASTROINTESTINAL PANEL BY PCR, STOOL (REPLACES STOOL CULTURE)
ASTROVIRUS: NOT DETECTED
Adenovirus F40/41: NOT DETECTED
CAMPYLOBACTER SPECIES: NOT DETECTED
CYCLOSPORA CAYETANENSIS: NOT DETECTED
Cryptosporidium: NOT DETECTED
ENTAMOEBA HISTOLYTICA: NOT DETECTED
Enteroaggregative E coli (EAEC): NOT DETECTED
Enteropathogenic E coli (EPEC): NOT DETECTED
Enterotoxigenic E coli (ETEC): NOT DETECTED
Giardia lamblia: NOT DETECTED
Norovirus GI/GII: NOT DETECTED
PLESIMONAS SHIGELLOIDES: NOT DETECTED
Rotavirus A: NOT DETECTED
SALMONELLA SPECIES: NOT DETECTED
SAPOVIRUS (I, II, IV, AND V): NOT DETECTED
Shiga like toxin producing E coli (STEC): NOT DETECTED
Shigella/Enteroinvasive E coli (EIEC): NOT DETECTED
VIBRIO SPECIES: NOT DETECTED
Vibrio cholerae: NOT DETECTED
Yersinia enterocolitica: NOT DETECTED

## 2017-09-16 LAB — BASIC METABOLIC PANEL
Anion gap: 10 (ref 5–15)
BUN: 31 mg/dL — AB (ref 6–20)
CHLORIDE: 113 mmol/L — AB (ref 101–111)
CO2: 18 mmol/L — AB (ref 22–32)
Calcium: 7.6 mg/dL — ABNORMAL LOW (ref 8.9–10.3)
Creatinine, Ser: 3.1 mg/dL — ABNORMAL HIGH (ref 0.61–1.24)
GFR calc Af Amer: 21 mL/min — ABNORMAL LOW (ref >60)
GFR calc non Af Amer: 18 mL/min — ABNORMAL LOW (ref >60)
Glucose, Bld: 135 mg/dL — ABNORMAL HIGH (ref 65–99)
POTASSIUM: 3.1 mmol/L — AB (ref 3.5–5.1)
SODIUM: 141 mmol/L (ref 135–145)

## 2017-09-16 LAB — RESPIRATORY PANEL BY PCR
ADENOVIRUS-RVPPCR: NOT DETECTED
Bordetella pertussis: NOT DETECTED
CHLAMYDOPHILA PNEUMONIAE-RVPPCR: NOT DETECTED
CORONAVIRUS 229E-RVPPCR: NOT DETECTED
CORONAVIRUS HKU1-RVPPCR: NOT DETECTED
CORONAVIRUS NL63-RVPPCR: NOT DETECTED
Coronavirus OC43: NOT DETECTED
Influenza A: NOT DETECTED
Influenza B: NOT DETECTED
MYCOPLASMA PNEUMONIAE-RVPPCR: NOT DETECTED
Metapneumovirus: NOT DETECTED
PARAINFLUENZA VIRUS 3-RVPPCR: NOT DETECTED
Parainfluenza Virus 1: NOT DETECTED
Parainfluenza Virus 2: NOT DETECTED
Parainfluenza Virus 4: NOT DETECTED
Respiratory Syncytial Virus: NOT DETECTED
Rhinovirus / Enterovirus: NOT DETECTED

## 2017-09-16 LAB — CBC WITH DIFFERENTIAL/PLATELET
BASOS ABS: 0 10*3/uL (ref 0.0–0.1)
Basophils Relative: 0 %
Eosinophils Absolute: 0 10*3/uL (ref 0.0–0.7)
Eosinophils Relative: 0 %
HCT: 32.1 % — ABNORMAL LOW (ref 39.0–52.0)
HEMOGLOBIN: 10.3 g/dL — AB (ref 13.0–17.0)
LYMPHS PCT: 6 %
Lymphs Abs: 2.5 10*3/uL (ref 0.7–4.0)
MCH: 24.9 pg — AB (ref 26.0–34.0)
MCHC: 32.1 g/dL (ref 30.0–36.0)
MCV: 77.7 fL — ABNORMAL LOW (ref 78.0–100.0)
MONOS PCT: 7 %
Monocytes Absolute: 3 10*3/uL — ABNORMAL HIGH (ref 0.1–1.0)
NEUTROS ABS: 36.9 10*3/uL — AB (ref 1.7–7.7)
Neutrophils Relative %: 87 %
Platelets: 330 10*3/uL (ref 150–400)
RBC: 4.13 MIL/uL — AB (ref 4.22–5.81)
RDW: 21.5 % — ABNORMAL HIGH (ref 11.5–15.5)
WBC: 42.4 10*3/uL — ABNORMAL HIGH (ref 4.0–10.5)

## 2017-09-16 LAB — CULTURE, BLOOD (ROUTINE X 2)
CULTURE: NO GROWTH
Culture: NO GROWTH
SPECIAL REQUESTS: ADEQUATE
Special Requests: ADEQUATE

## 2017-09-16 LAB — BLOOD GAS, ARTERIAL
Acid-base deficit: 8 mmol/L — ABNORMAL HIGH (ref 0.0–2.0)
Bicarbonate: 16.4 mmol/L — ABNORMAL LOW (ref 20.0–28.0)
DRAWN BY: 232811
O2 Content: 4 L/min
O2 Saturation: 93.5 %
PH ART: 7.337 — AB (ref 7.350–7.450)
Patient temperature: 98.8
pCO2 arterial: 31.5 mmHg — ABNORMAL LOW (ref 32.0–48.0)
pO2, Arterial: 74.3 mmHg — ABNORMAL LOW (ref 83.0–108.0)

## 2017-09-16 LAB — HEPATIC FUNCTION PANEL
ALT: 22 U/L (ref 17–63)
AST: 28 U/L (ref 15–41)
Albumin: 1.9 g/dL — ABNORMAL LOW (ref 3.5–5.0)
Alkaline Phosphatase: 88 U/L (ref 38–126)
Bilirubin, Direct: 0.2 mg/dL (ref 0.1–0.5)
Indirect Bilirubin: 0.6 mg/dL (ref 0.3–0.9)
TOTAL PROTEIN: 4.9 g/dL — AB (ref 6.5–8.1)
Total Bilirubin: 0.8 mg/dL (ref 0.3–1.2)

## 2017-09-16 LAB — LEGIONELLA PNEUMOPHILA SEROGP 1 UR AG: L. PNEUMOPHILA SEROGP 1 UR AG: NEGATIVE

## 2017-09-16 LAB — VANCOMYCIN, RANDOM: VANCOMYCIN RM: 15

## 2017-09-16 LAB — MAGNESIUM: MAGNESIUM: 1.6 mg/dL — AB (ref 1.7–2.4)

## 2017-09-16 LAB — HEPARIN LEVEL (UNFRACTIONATED): HEPARIN UNFRACTIONATED: 0.46 [IU]/mL (ref 0.30–0.70)

## 2017-09-16 LAB — PROTIME-INR
INR: 3.28
Prothrombin Time: 33.1 seconds — ABNORMAL HIGH (ref 11.4–15.2)

## 2017-09-16 LAB — TROPONIN I: Troponin I: 0.06 ng/mL (ref <0.03)

## 2017-09-16 LAB — PHOSPHORUS: PHOSPHORUS: 3.5 mg/dL (ref 2.5–4.6)

## 2017-09-16 LAB — ABO/RH: ABO/RH(D): A POS

## 2017-09-16 MED ORDER — SODIUM CHLORIDE 0.9 % IV SOLN
1.0000 g | Freq: Two times a day (BID) | INTRAVENOUS | Status: DC
  Administered 2017-09-16 – 2017-09-17 (×3): 1 g via INTRAVENOUS
  Filled 2017-09-16 (×3): qty 1

## 2017-09-16 MED ORDER — LINEZOLID 600 MG/300ML IV SOLN
600.0000 mg | Freq: Two times a day (BID) | INTRAVENOUS | Status: DC
  Administered 2017-09-16 – 2017-09-20 (×9): 600 mg via INTRAVENOUS
  Filled 2017-09-16 (×8): qty 300

## 2017-09-16 MED ORDER — POTASSIUM CHLORIDE 10 MEQ/100ML IV SOLN
10.0000 meq | INTRAVENOUS | Status: AC
  Administered 2017-09-16 (×4): 10 meq via INTRAVENOUS
  Filled 2017-09-16 (×4): qty 100

## 2017-09-16 MED ORDER — LINEZOLID 100 MG/5ML PO SUSR
600.0000 mg | Freq: Two times a day (BID) | ORAL | Status: DC
  Filled 2017-09-16: qty 30

## 2017-09-16 MED ORDER — SODIUM CHLORIDE 0.9 % IV SOLN
Freq: Once | INTRAVENOUS | Status: DC
  Administered 2017-09-16: 10:00:00 via INTRAVENOUS

## 2017-09-16 MED ORDER — NYSTATIN 100000 UNIT/ML MT SUSP
5.0000 mL | Freq: Four times a day (QID) | OROMUCOSAL | Status: DC
  Administered 2017-09-16 – 2017-09-20 (×18): 500000 [IU] via ORAL
  Administered 2017-09-20: 5 mL via ORAL
  Administered 2017-09-20 – 2017-09-24 (×15): 500000 [IU] via ORAL
  Filled 2017-09-16 (×33): qty 5

## 2017-09-16 MED ORDER — SODIUM CHLORIDE 3 % IN NEBU
4.0000 mL | INHALATION_SOLUTION | Freq: Every day | RESPIRATORY_TRACT | Status: AC
  Administered 2017-09-16 – 2017-09-18 (×3): 4 mL via RESPIRATORY_TRACT
  Filled 2017-09-16 (×3): qty 4

## 2017-09-16 MED ORDER — VANCOMYCIN HCL 10 G IV SOLR
1250.0000 mg | Freq: Once | INTRAVENOUS | Status: AC
  Administered 2017-09-16: 1250 mg via INTRAVENOUS
  Filled 2017-09-16: qty 1250

## 2017-09-16 MED ORDER — LINEZOLID 600 MG/300ML IV SOLN
600.0000 mg | Freq: Two times a day (BID) | INTRAVENOUS | Status: DC
  Filled 2017-09-16: qty 300

## 2017-09-16 NOTE — Progress Notes (Addendum)
eLink Physician-Brief Progress Note Patient Name: [name] DOB: January 23, 1946 MRN: [id number]   Date of Service  09/16/2017  HPI/Events of Note  INR = 3.2. Anticipate possible L thoracentesis.   eICU Interventions  Will order: 1. Transfuse 3 units FFP . 2. Repeat PT/INR at 12 noon.      Intervention Category Intermediate Interventions: Coagulopathy - evaluation and management  Bryttany Tortorelli [place] 09/16/2017, 6:59 AM

## 2017-09-16 NOTE — Progress Notes (Signed)
PULMONARY / CRITICAL CARE MEDICINE   Name: [name] MRN: [id number] DOB: 03/07/1946 PCP [name], MD LOS 2 as of 09/16/2017     ADMISSION DATE:  09/14/2017 CONSULTATION DATE:  09/16/2017   REFERRING MD:  Dr Susie Cassette of [facility]  CHIEF COMPLAINT:  Septic shock  HISTORY OF PRESENT ILLNESS:   71 year old male history is given by his wife, bedside nurse Judson Roch and also review of the chart  He has colon cancer undergoing chemotherapy.  He initially presented to the oncology clinic on September 11, 2017 with a history of diminished p.o. intake for 2 weeks and noticed to be hyponatremic with a creatinine of 1.9 mg percent and a BUN greater than 20 with a white count greater than 23,000 and a low potassium of 2.7.  When evaluated by hospitalist service on September 11, 2017 he denied any coughing, sputum production dysuria fevers or rashes or open sores.  Chemotherapy was held dehydration secondary to anorexia was suspected and IV fluid and electrolyte replacement was started.  At that time patient was normotensive.  History of atrial fibrillation that was rate controlled was also noted but his INR was slightly high.  He was then discharged from the emergency department  He represented on September 14, 2017.  Apparently initially he was improving after discharge from the emergency room on the seventh.  But he started developing congested and started having cough and weakness but denied any fever chills weakness dizziness chest pain shortness of breath abdominal pain dysuria or altered mental status.  This time he was admitted and upon admission it was felt that he might be in pulmonary congestion [he follows at [facility] cardiovascular with Dr. Einar Gip and his 2018 echo and cardiac stress test were normal according to history obtained today] he was given Lasix and also broad-spectrum antibiotics and placed on Cardizem drip for atrial fibrillation.  But subsequently has declined with tachypnea and also  circulatory hypotension not responding to initial small bolus of fluids and transferred to the intensive care unit.  Upon critical care medicine evaluation he admitted to fatigue and also was having diarrhea which the wife says is chronic because of colon cancer therapy.  His white count is high.  And chest x-ray more consistent with possible left lower lobe pneumonia   Interval/subjective Crease work of breathing over the course of the evening still significantly short of breath   VITAL SIGNS: BP 132/60   Pulse 89   Temp (!) 97.4 F (36.3 C) (Oral)   Resp (!) 22   Ht 6\' 2"  (1.88 m)   Wt 273 lb 13 oz (124.2 kg)   SpO2 98%   BMI 35.16 kg/m   Nasal cannula  Intake/Output Summary (Last 24 hours) at 09/16/2017 2025 Last data filed at 09/16/2017 0750 Gross per 24 hour  Intake 6934.32 ml  Output 600 ml  Net 6334.32 ml     EXAM General: 71 year old white male lying in the bed.  Very weak and deconditioned not in acute distress but certainly labored with any effort HEENT: Normocephalic atraumatic.  His mucous membranes are severely dry and cracked.  He has reddened dry scaly skin caked over his posterior pharynx.  His speech is of whisper quality only. Pulmonary: Severely decreased on the left no significant accessory muscle use at rest however does become more labored with any effort some scattered rhonchi that clear with cough Cardiac controlled atrial fib regular rate and rhythm no murmur rub or gallop Abdomen: Flexi-Seal in place  draining stool abdomen soft nontender positive bowel sounds poor appetite Neuro: Awake alert oriented x3 no focal motor deficits but significantly weak Extremities/musculoskeletal generalized weakness generalized anasarca warm dry positive pulses    LABS  PULMONARY Recent Labs  Lab 09/15/17 1142 09/15/17 1510 09/16/17 0305  PHART  --  7.324* 7.337*  PCO2ART  --  27.5* 31.5*  PO2ART  --  73.4* 74.3*  HCO3 7.9* 13.9* 16.4*  O2SAT 35.8 93.6  93.5    CBC Recent Labs  Lab 09/15/17 0756 09/15/17 0951 09/16/17 0300  HGB 10.9* 10.8* 10.3*  HCT 33.8* 34.4* 32.1*  WBC 33.5* 31.6* 42.4*  PLT 266 263 330    COAGULATION Recent Labs  Lab 09/13/17 0548 09/14/17 1746 09/15/17 0756 09/15/17 0951 09/16/17 0300  INR 1.35 1.67 2.10 2.37 3.28    CARDIAC   Recent Labs  Lab 09/14/17 1628 09/15/17 0951 09/16/17 0300  TROPONINI <0.03 0.04* 0.06*   No results for input(s): PROBNP in the last 168 hours.   CHEMISTRY Recent Labs  Lab 09/12/17 0356 09/13/17 0548 09/14/17 1628 09/15/17 0756 09/15/17 0951 09/16/17 0300  NA 135 138 136 139 140 141  K 2.7* 2.9* 3.5 3.5 3.7 3.1*  CL 109 115* 113* 114* 116* 113*  CO2 17* 16* 14* 16* 16* 18*  GLUCOSE 120* 114* 176* 136* 141* 135*  BUN 28* 19 16 25* 26* 31*  CREATININE 1.53* 1.14 1.10 2.36* 2.53* 3.10*  CALCIUM 8.2* 7.9* 8.6* 8.2* 8.1* 7.6*  MG 1.5* 1.8 1.7  --   --  1.6*  PHOS  --   --  2.9  --   --  3.5   Estimated Creatinine Clearance: 29.7 mL/min (A) (by C-G formula based on SCr of 3.1 mg/dL (H)).   LIVER Recent Labs  Lab 09/11/17 0949  09/12/17 0356 09/13/17 0548 09/14/17 1746 09/15/17 0756 09/15/17 0951 09/16/17 0300  AST 24  --  26  --   --   --  21 28  ALT 27  --  25  --   --   --  23 22  ALKPHOS 93  --  83  --   --   --  79 88  BILITOT 0.77  --  0.7  --   --   --  1.0 0.8  PROT 5.9*  --  5.2*  --   --   --  5.0* 4.9*  ALBUMIN 2.5*  --  2.4*  --   --   --  2.0* 1.9*  INR  --    < > 11.58* 1.35 1.67 2.10 2.37 3.28   < > = values in this interval not displayed.     INFECTIOUS Recent Labs  Lab 09/14/17 1746 09/15/17 0951 09/15/17 1652  LATICACIDVEN 1.4 1.3 1.2  PROCALCITON  --  2.14  --      ENDOCRINE CBG (last 3)  No results for input(s): GLUCAP in the last 72 hours.       IMAGING x48h  - image(s) personally visualized  -   highlighted in bold Dg Chest Port 1 View  Result Date: 09/16/2017 CLINICAL DATA:  71 y/o  M; respiratory  distress. EXAM: PORTABLE CHEST 1 VIEW COMPARISON:  09/15/2017 chest radiograph. FINDINGS: Large increased left pleural effusion and worsening atelectasis of left lung. Clear right lung. Cardiac silhouette obscured by left diffusion. Left central venous catheter tip projects over mid S be seen. No acute osseous abnormality is evident. IMPRESSION: Increased large left pleural effusion and worsening atelectasis  of left lung. Clear right lung. Electronically Signed   By: Kristine Garbe M.D.   On: 09/16/2017 01:34   Dg Chest Port 1 View  Result Date: 09/15/2017 CLINICAL DATA:  Shortness of Breath EXAM: PORTABLE CHEST 1 VIEW COMPARISON:  September 14, 2017 FINDINGS: There is airspace consolidation in the left lower lobe with small left pleural effusion. Right lung is clear. Heart is mildly enlarged with pulmonary vascularity within normal limits. No adenopathy. Port-A-Cath tip is in superior vena cava. There is aortic atherosclerosis. No bone lesions. IMPRESSION: Airspace consolidation consistent with pneumonia left base, increased from 1 day prior. Small left pleural effusion. Right lung clear. Stable cardiac prominence.  There is aortic atherosclerosis. Aortic Atherosclerosis (ICD10-I70.0). Electronically Signed   By: Lowella Grip III M.D.   On: 09/15/2017 09:48   Dg Chest Portable 1 View  Result Date: 09/14/2017 CLINICAL DATA:  Weakness and productive cough. EXAM: PORTABLE CHEST 1 VIEW COMPARISON:  09/11/2017 FINDINGS: Injectable port in stable position. Enlarged cardiac silhouette. Calcific atherosclerotic disease of the aorta. There is no evidence of pleural effusion or pneumothorax. Patchy lower lobe predominant peribronchial airspace opacities. Osseous structures are without acute abnormality. Soft tissues are grossly normal. IMPRESSION: Patchy lower lobe predominant peribronchial airspace opacities may represent asymmetry pulmonary edema or developing bronchopneumonia. Electronically Signed    By: Fidela Salisbury M.D.   On: 09/14/2017 17:04     ASSESSMENT and PLAN 71 year old male patient with history of colon cancer now being admitted for healthcare associated pneumonia, associated large left pleural effusion and septic shock.  Has worsening renal function as well as effusion.  For today plan will be to continue supportive measures this will include diagnostic and therapeutic thoracentesis, maintaining IV fluid resuscitation efforts, renal dosing medications, continuing bicarbonate replacement, and follow-up pending diagnostic laboratory data.  His status is very much guarded at this point.  I have reviewed the plan of care with the patient and the nursing staff  ASSESSMENT / PLAN:  Acute respiratory failure in the setting of healthcare associated pneumonia with associated left pleural effusion Portable chest x-ray personally reviewed a.m. 11/12 demonstrates progression specifically left-sided airspace disease and effusion Work of breathing and respiratory effort worse over the course of the evening Urine strep negative Plan Continue supplemental oxygen Incentive spirometry Bicarbonate supplementation Therapeutic and diagnostic thoracentesis on the left side today Day number 3 vancomycin and Zosyn; changing to meropenem and vancomycin today Follow-up pending sputum culture Follow-up respiratory viral panel Follow-up pending urinary Legionella antigen  Septic shock in the setting of pneumonia Atrial fibrillation with controlled ventricular rate Plan Aim for a euvolemic volume status Continue Neo-Synephrine for mean arterial goal greater than 65 Holding antihypertensives and diuretics Continue telemetry monitoring  Acute kidney injury with normal anion gap metabolic acidosis. Suspect that this is a mixed of bicarbonate loss from diarrhea as well as hyperchloremia.  Creatinine worse, approximately 6 L positive since admission Plan Mean arterial pressure goal greater  than 65 Renal dose medications Strict intake and output Continue bicarbonate infusion to address bicarbonate loss in stool Avoid sodium chloride Serial blood chemistries Stopping vancomycin and Zosyn, day renal protection  Hypokalemia Plan Replace and recheck  Colon cancer undergoing chemotherapy and likely chronic diarrhea.  -Continues to have marked diarrhea.  So far C. difficile toxin and PCR negative.  GI panel pending.  Flexi-Seal is in place Plan Flexi-Seal maintenance per nursing Holding stool softeners and laxatives Using H2 blockade instead of PPI Contact precautions  Anemia of chronic disease and  cancer Severe leukocytosis, had been getting Neulasta, now acutely ill.  Had history of diarrhea but C. difficile toxin and PCR were negative.  No evidence of bleeding plan Trend serial CBC Heparin per pharmacy Transfuse for hemoglobin less than 7  Warfarin induced coagulopathy Plan Holding warfarin FFP prior to procedure today  Oral thrush with odynophagia and mucositis Plan Change oral nystatin to scheduled  Protein calorie malnutrition Plan Will need nutritional consultation  Severe deconditioning Plan Physical therapy consult and mobilization as soon as hemodynamic  My critical care time 40 minutes Erick Colace ACNP-BC [place] Pager # [phone] OR # [phone] if no answer  Attending Note:  70 year old male with colon cancer history that is immune compromised and is presenting with pneumonia and septic shock.  On chronic anti-coagulation for a-fib.  On exam, decreased BS on the left and basilar crackles on the right.  I reviewed CXR myself, worsening effusion on the left.  Patient is on an heparin drip for a-fib.  Will plan a thoracentesis today.  Hold coumadin.  Neo for BP support.  Acidosis is improving.  Will stop bicarb drip and re-evaluate post thora.  The patient is critically ill with multiple organ systems failure and requires  high complexity decision making for assessment and support, frequent evaluation and titration of therapies, application of advanced monitoring technologies and extensive interpretation of multiple databases.   Critical Care Time devoted to patient care services described in this note is  35  Minutes. This time reflects time of care of this signee Dr Jennet Maduro. This critical care time does not reflect procedure time, or teaching time or supervisory time of PA/NP/Med student/Med Resident etc but could involve care discussion time.  Rush Farmer, M.D. [facility] Pulmonary/Critical Care Medicine. Pager: [phone]. After hours pager: [phone].

## 2017-09-16 NOTE — Care Management Note (Signed)
Case Management Note  Patient Details  Name: [name] MRN: [id number] Date of Birth: [birth year]  Subjective/Objective:                  A.fib with rvr, hypotensive, Iv neo drip/iv heparin/iv abx  Action/Plan: Date: September 16, 2017 Velva Harman, BSN, [place], [place] Chart and notes review for patient progress and needs. Will follow for case management and discharge needs. Next review date: [id number]  Expected Discharge Date:                  Expected Discharge Plan:     In-House Referral:     Discharge planning Services     Post Acute Care Choice:    Choice offered to:     DME Arranged:    DME Agency:     HH Arranged:    HH Agency:     Status of Service:     If discussed at [employer], dates discussed:    Additional Comments:  Frederick Cha, RN 09/16/2017, 8:38 AM

## 2017-09-16 NOTE — Progress Notes (Signed)
Pharmacy Antibiotic Note  [name] is a 71 y.o. male admitted on 09/14/2017 with pneumonia and sepsis.  Pharmacy had been consulted for Vancomycin and Zosyn dosing on 11/10. Now to change to Zyvox and meropenem due to developing renal failure and continued worsening of WBC.  SCr 1.10 >> 2.36 >>2.53 >> 3.1  Plan: 1) Meropenem 1g IV q12 for CrCl 10-25 ml/min per PI 2) Zyvox per CCM 3) Continue to monitor SCr closely  Height: 6\' 2"  (188 cm) Weight: 273 lb 13 oz (124.2 kg) IBW/kg (Calculated) : 82.2  Temp (24hrs), Avg:97.9 F (36.6 C), Min:97.4 F (36.3 C), Max:98.6 F (37 C)  Recent Labs  Lab 09/13/17 0548 09/14/17 1628 09/14/17 1746 09/15/17 0756 09/15/17 0951 09/15/17 1652 09/16/17 0300  WBC 14.3* 35.8*  --  33.5* 31.6*  --  42.4*  CREATININE 1.14 1.10  --  2.36* 2.53*  --  3.10*  LATICACIDVEN  --   --  1.4  --  1.3 1.2  --   VANCORANDOM  --   --   --   --   --   --  15    Estimated Creatinine Clearance: 29.7 mL/min (A) (by C-G formula based on SCr of 3.1 mg/dL (H)).    No Known Allergies  Antimicrobials this admission: Vancomycin 09/16/2017 >> 11/12 Zosyn 09/16/2017 >> 11/12 11/12 meropenem >> 11/12 Zyvox >>  Dose adjustments this admission: Vanc empirically changed from 1250mg  q12 to 1gm q24 for worsening SCr  Microbiology results: Previous admit: 11/7 BCx: ngtd  11/10 Sputum: sent, acceptable  11/11 BCx x1 set: sent 11/11 Cdiff: neg 11/11 MRSA PCR: neg  Thank you for allowing pharmacy to be a part of this patient's care.  Adrian Saran, PharmD, BCPS Pager [phone] 09/16/2017 9:45 AM

## 2017-09-16 NOTE — Progress Notes (Signed)
ANTICOAGULATION CONSULT NOTE - Follow Up Consult  Pharmacy Consult for Heparin Indication: atrial fibrillation  No Known Allergies  Patient Measurements: Height: 6\' 2"  (188 cm) Weight: 273 lb 13 oz (124.2 kg) IBW/kg (Calculated) : 82.2 Heparin Dosing Weight:   Vital Signs: Temp: 98.6 F (37 C) (11/12 0017) Temp Source: Oral (11/12 0017) BP: 119/41 (11/12 0600) Pulse Rate: 91 (11/12 0600)  Labs: Recent Labs    09/14/17 1628  09/15/17 0756 09/15/17 0951 09/15/17 1652 09/16/17 0300  HGB 12.9*  --  10.9* 10.8*  --   --   HCT 39.9  --  33.8* 34.4*  --   --   PLT 291  --  266 263  --   --   APTT  --   --  43* 42*  --   --   LABPROT  --    < > 23.4* 25.7*  --  33.1*  INR  --    < > 2.10 2.37  --  3.28  HEPARINUNFRC  --   --   --   --  0.19* 0.46  CREATININE 1.10  --  2.36* 2.53*  --  3.10*  TROPONINI <0.03  --   --  0.04*  --  0.06*   < > = values in this interval not displayed.    Estimated Creatinine Clearance: 29.7 mL/min (A) (by C-G formula based on SCr of 3.1 mg/dL (H)).   Medications:  Infusions:  . heparin 1,650 Units/hr (09/16/17 0125)  . lactated ringers 100 mL/hr at 09/16/17 0126  . phenylephrine (NEO-SYNEPHRINE) Adult infusion 210 mcg/min (09/16/17 0355)  . piperacillin-tazobactam (ZOSYN)  IV 3.375 g (09/16/17 0500)  .  sodium bicarbonate  infusion 1000 mL 75 mL/hr at 09/16/17 0126  . vancomycin      Assessment: Patient with heparin level at goal.  No heparin issues per RN.  Goal of Therapy:  Heparin level 0.3-0.7 units/ml Monitor platelets by anticoagulation protocol: Yes   Plan:  Continue heparin drip at current rate Recheck level at [address], [place] 09/16/2017,6:27 AM

## 2017-09-16 NOTE — Progress Notes (Signed)
Pharmacy Antibiotic Note  [name] is a 71 y.o. male admitted on 09/14/2017 with pneumonia.  Pharmacy has been consulted for Vancomycin dosing.  Vancomycin random 11/12 AM = 15.  SCr increase again.  Plan: Will redose vancomycin with 1250mg  iv x1 Consider repeat level with 11/13 AM labs or later.  Height: 6\' 2"  (188 cm) Weight: 273 lb 13 oz (124.2 kg) IBW/kg (Calculated) : 82.2  Temp (24hrs), Avg:97.8 F (36.6 C), Min:97.4 F (36.3 C), Max:98.6 F (37 C)  Recent Labs  Lab 09/12/17 0356 09/13/17 0548 09/14/17 1628 09/14/17 1746 09/15/17 0756 09/15/17 0951 09/15/17 1652 09/16/17 0300  WBC 15.4* 14.3* 35.8*  --  33.5* 31.6*  --   --   CREATININE 1.53* 1.14 1.10  --  2.36* 2.53*  --  3.10*  LATICACIDVEN  --   --   --  1.4  --  1.3 1.2  --   VANCORANDOM  --   --   --   --   --   --   --  15    Estimated Creatinine Clearance: 29.7 mL/min (A) (by C-G formula based on SCr of 3.1 mg/dL (H)).    No Known Allergies  Antimicrobials this admission: Vancomycin 09/15/2017 >> Zosyn 09/15/2017 >>   Dose adjustments this admission: 1250mg  q12hr (457, 1.1 96.1kg ABW) as initial dose, reduce to 1gm q24 11/12 AM VR = 15; redose with vanc 1250mg  iv x1  Microbiology results: Previous admit: 11/7 BCx: ngtd  11/10 Sputum: sent, acceptable  11/11 BCx x1 set: sent    Thank you for allowing pharmacy to be a part of this patient's care.  [name] 09/16/2017 6:25 AM

## 2017-09-16 NOTE — Telephone Encounter (Signed)
Scheduled appt per 11/8 sch msg - left message for patient regarding added appts.

## 2017-09-17 DIAGNOSIS — R5381 Other malaise: Secondary | ICD-10-CM

## 2017-09-17 LAB — BPAM FFP
BLOOD PRODUCT EXPIRATION DATE: 201811172359
Blood Product Expiration Date: 201811172359
Blood Product Expiration Date: 201811172359
ISSUE DATE / TIME: 201811121054
ISSUE DATE / TIME: 201811120913
ISSUE DATE / TIME: 201811121218
UNIT TYPE AND RH: 6200
UNIT TYPE AND RH: 6200
Unit Type and Rh: 6200

## 2017-09-17 LAB — CBC WITH DIFFERENTIAL/PLATELET
BASOS ABS: 0 10*3/uL (ref 0.0–0.1)
Basophils Relative: 0 %
Eosinophils Absolute: 0 10*3/uL (ref 0.0–0.7)
Eosinophils Relative: 0 %
HCT: 27.9 % — ABNORMAL LOW (ref 39.0–52.0)
Hemoglobin: 9 g/dL — ABNORMAL LOW (ref 13.0–17.0)
LYMPHS ABS: 1.4 10*3/uL (ref 0.7–4.0)
Lymphocytes Relative: 6 %
MCH: 25.1 pg — ABNORMAL LOW (ref 26.0–34.0)
MCHC: 32.3 g/dL (ref 30.0–36.0)
MCV: 77.9 fL — ABNORMAL LOW (ref 78.0–100.0)
MONO ABS: 2.1 10*3/uL — AB (ref 0.1–1.0)
Monocytes Relative: 9 %
NEUTROS PCT: 85 %
Neutro Abs: 19.6 10*3/uL — ABNORMAL HIGH (ref 1.7–7.7)
PLATELETS: 231 10*3/uL (ref 150–400)
RBC: 3.58 MIL/uL — AB (ref 4.22–5.81)
RDW: 21.7 % — AB (ref 11.5–15.5)
WBC: 23.1 10*3/uL — AB (ref 4.0–10.5)

## 2017-09-17 LAB — BASIC METABOLIC PANEL
Anion gap: 8 (ref 5–15)
BUN: 36 mg/dL — ABNORMAL HIGH (ref 6–20)
CO2: 18 mmol/L — ABNORMAL LOW (ref 22–32)
Calcium: 7.7 mg/dL — ABNORMAL LOW (ref 8.9–10.3)
Chloride: 117 mmol/L — ABNORMAL HIGH (ref 101–111)
Creatinine, Ser: 3.65 mg/dL — ABNORMAL HIGH (ref 0.61–1.24)
GFR calc Af Amer: 18 mL/min — ABNORMAL LOW (ref >60)
GFR, EST NON AFRICAN AMERICAN: 15 mL/min — AB (ref >60)
GLUCOSE: 92 mg/dL (ref 65–99)
POTASSIUM: 3.2 mmol/L — AB (ref 3.5–5.1)
SODIUM: 143 mmol/L (ref 135–145)

## 2017-09-17 LAB — PREPARE FRESH FROZEN PLASMA
UNIT DIVISION: 0
Unit division: 0
Unit division: 0

## 2017-09-17 LAB — PROTIME-INR
INR: 2.9
PROTHROMBIN TIME: 30.1 s — AB (ref 11.4–15.2)

## 2017-09-17 LAB — CREATININE, SERUM
Creatinine, Ser: 3.55 mg/dL — ABNORMAL HIGH (ref 0.61–1.24)
GFR calc non Af Amer: 16 mL/min — ABNORMAL LOW (ref >60)
GFR, EST AFRICAN AMERICAN: 18 mL/min — AB (ref >60)

## 2017-09-17 LAB — MAGNESIUM: MAGNESIUM: 1.7 mg/dL (ref 1.7–2.4)

## 2017-09-17 LAB — PHOSPHORUS: PHOSPHORUS: 3.8 mg/dL (ref 2.5–4.6)

## 2017-09-17 LAB — HEPARIN LEVEL (UNFRACTIONATED): HEPARIN UNFRACTIONATED: 0.22 [IU]/mL — AB (ref 0.30–0.70)

## 2017-09-17 MED ORDER — DIPHENOXYLATE-ATROPINE 2.5-0.025 MG PO TABS
2.0000 | ORAL_TABLET | Freq: Four times a day (QID) | ORAL | Status: DC | PRN
  Administered 2017-09-17 – 2017-09-20 (×2): 2 via ORAL
  Filled 2017-09-17 (×3): qty 2

## 2017-09-17 MED ORDER — IPRATROPIUM-ALBUTEROL 0.5-2.5 (3) MG/3ML IN SOLN
3.0000 mL | Freq: Three times a day (TID) | RESPIRATORY_TRACT | Status: DC
  Administered 2017-09-18: 3 mL via RESPIRATORY_TRACT
  Filled 2017-09-17: qty 3

## 2017-09-17 MED ORDER — ADULT MULTIVITAMIN W/MINERALS CH
1.0000 | ORAL_TABLET | Freq: Every day | ORAL | Status: DC
  Administered 2017-09-18 – 2017-09-24 (×7): 1 via ORAL
  Filled 2017-09-17 (×7): qty 1

## 2017-09-17 MED ORDER — POTASSIUM CHLORIDE CRYS ER 20 MEQ PO TBCR
40.0000 meq | EXTENDED_RELEASE_TABLET | ORAL | Status: AC
  Administered 2017-09-17 (×4): 40 meq via ORAL
  Filled 2017-09-17 (×4): qty 2

## 2017-09-17 MED ORDER — CHLORHEXIDINE GLUCONATE CLOTH 2 % EX PADS
6.0000 | MEDICATED_PAD | Freq: Every day | CUTANEOUS | Status: DC
  Administered 2017-09-17 – 2017-09-21 (×5): 6 via TOPICAL

## 2017-09-17 MED ORDER — WARFARIN 0.5 MG HALF TABLET
0.5000 mg | ORAL_TABLET | Freq: Once | ORAL | Status: AC
  Administered 2017-09-17: 0.5 mg via ORAL
  Filled 2017-09-17: qty 1

## 2017-09-17 MED ORDER — SODIUM CHLORIDE 0.9 % IV SOLN
500.0000 mg | Freq: Two times a day (BID) | INTRAVENOUS | Status: DC
  Administered 2017-09-18 – 2017-09-19 (×3): 500 mg via INTRAVENOUS
  Filled 2017-09-17 (×5): qty 0.5

## 2017-09-17 MED ORDER — LACTATED RINGERS IV SOLN
INTRAVENOUS | Status: DC
  Administered 2017-09-17: 12:00:00 via INTRAVENOUS

## 2017-09-17 MED ORDER — WARFARIN - PHARMACIST DOSING INPATIENT
Freq: Every day | Status: DC
  Administered 2017-09-17: 18:00:00

## 2017-09-17 MED ORDER — SODIUM BICARBONATE 650 MG PO TABS
650.0000 mg | ORAL_TABLET | Freq: Two times a day (BID) | ORAL | Status: DC
  Administered 2017-09-17 – 2017-09-24 (×14): 650 mg via ORAL
  Filled 2017-09-17 (×14): qty 1

## 2017-09-17 MED ORDER — SODIUM CHLORIDE 0.9% FLUSH
10.0000 mL | INTRAVENOUS | Status: DC | PRN
  Administered 2017-09-21 – 2017-09-24 (×2): 10 mL
  Filled 2017-09-17 (×2): qty 40

## 2017-09-17 MED ORDER — ENSURE ENLIVE PO LIQD
237.0000 mL | Freq: Two times a day (BID) | ORAL | Status: DC
  Administered 2017-09-18 – 2017-09-23 (×5): 237 mL via ORAL

## 2017-09-17 NOTE — Progress Notes (Signed)
Pharmacy Antibiotic Note  [name] is a 71 y.o. male admitted on 09/14/2017 with pneumonia and sepsis.  Pharmacy had been consulted for Vancomycin and Zosyn dosing on 11/10. Now to change to Zyvox and meropenem due to developing renal failure and continued worsening of WBC.  SCr 1.10 >> 2.36 >>2.53 >> 3.1 >> 3.65  Plan: 1) Change meropenem from 1g IV q12 to 500mg  IV q12 due to continued rising Scr 2) Zyvox per CCM 3) Continue to monitor SCr closely  Height: 6\' 2"  (188 cm) Weight: 273 lb 13 oz (124.2 kg) IBW/kg (Calculated) : 82.2  Temp (24hrs), Avg:98.2 F (36.8 C), Min:97.6 F (36.4 C), Max:99.1 F (37.3 C)  Recent Labs  Lab 09/14/17 1628 09/14/17 1746 09/15/17 0756 09/15/17 0951 09/15/17 1652 09/16/17 0300 09/17/17 0500  WBC 35.8*  --  33.5* 31.6*  --  42.4* 23.1*  CREATININE 1.10  --  2.36* 2.53*  --  3.10* 3.65*  LATICACIDVEN  --  1.4  --  1.3 1.2  --   --   VANCORANDOM  --   --   --   --   --  15  --     Estimated Creatinine Clearance: 25.2 mL/min (A) (by C-G formula based on SCr of 3.65 mg/dL (H)).    No Known Allergies  Antimicrobials this admission: Vancomycin 09/17/2017 >> 11/12 Zosyn 09/17/2017 >> 11/12 11/12 meropenem >> 11/12 Zyvox >>  Dose adjustments this admission: Vanc empirically changed from 1250mg  q12 to 1gm q24 for worsening SCr  Microbiology results: Previous admit: 11/7 BCx: ngtd  11/10 Sputum: sent, acceptable  11/11 BCx x1 set: sent 11/11 Cdiff: neg 11/11 MRSA PCR: neg  Thank you for allowing pharmacy to be a part of this patient's care.  Adrian Saran, PharmD, BCPS Pager [phone] 09/17/2017 11:45 AM

## 2017-09-17 NOTE — Progress Notes (Signed)
Initial Nutrition Assessment  DOCUMENTATION CODES:   Obesity unspecified  INTERVENTION:  - Will order Ensure Enlive BID, each supplement provides 350 kcal and 20 grams of protein - Will order Magic Cup BID with meals (lunch and dinner), each supplement provides 290 kcal and 9 grams of protein - Will order daily multivitamin with minerals. - Continue to encourage PO intakes of meals and supplements. - Diet advancement as medically feasible.   NUTRITION DIAGNOSIS:   Increased nutrient needs related to catabolic illness, cancer and cancer related treatments as evidenced by estimated needs.  GOAL:   Patient will meet greater than or equal to 90% of their needs  MONITOR:   PO intake, Supplement acceptance, Weight trends, Labs, Skin  REASON FOR ASSESSMENT:   Malnutrition Screening Tool, Consult Assessment of nutrition requirement/status  ASSESSMENT:   71 year old male with colon cancer undergoing chemotherapy. He initially presented to the oncology clinic on 11/7 with diminished P.O. intake for 2 weeks and noticed to be hyponatremic, hypokalemic, and with a white count greater than 23,000. Chemotherapy was held d/t dehydration and anorexia was suspected and IV fluid and electrolyte replacement was started. He re-presented on 11/10 d/t developing congested, cough, and weakness. This time he was admitted and upon admission it was felt that he might be in pulmonary congestion. Found to have PNA and sepsis and he was given Lasix and also broad-spectrum antibiotics and placed on Cardizem drip for atrial fibrillation.  BMI indicates obesity. Pt was unable to be seen by another RD x3 attempts yesterday (11/12). Pt drowsy during this RD visit. He reports that he was sleeping during breakfast and that ice cream and Sprite (which he was slowly consuming during RD visit) was his lunch. Ambassador outside of room at the end of RD visit and asked RD if pt would like to order lunch; encouraged her to  offer items to pt. Pt reports that he has had a decreased appetite and that "it's a long story." He confirms decreased appetite with taste alterations (no taste, bland taste) since starting chemo and states that today is 3 weeks since his last treatment. He does indicate that he needed to miss a treatment schedule within those 3 weeks d/t low WBC count.   He states that this afternoon appetite is slightly better than it has been recently. He reports that yesterday he was so sick and weak that he was unable to talk and that he is thankful to be able to talk today but that it is draining for him. Pt's voice sounded somewhat hoarse. Pt was appreciative when RD mentioned plan to stop by another time so that he can rest/rest his voice at this time.   Notes state pt has has chronic diarrhea while on chemo. Also states episodes of dehydration with associated electrolyte imbalance. Weight fluctuations since admission; will monitor closely. Per chart review, pt weight has mainly been stable (280-285 lbs) over the past 2 months. Based on current weight, he has lost 6 lbs (2% body weight) in the past 3 months. This is not significant for time frame.  Pt does not meet criteria for malnutrition at this time based on ASPEN criteria for malnutrition, but he is at high risk.   Medications reviewed; 20 mg oral Pepcid/day, 5 mL Mycostatin QID, 40 mEq oral KCl x4 doses today, 650 mg oral sodium bicarb BID. Labs reviewed; K: 3.2 mmol/L, Cl: 117 mmol/L, BUN: 36 mg/dL, creatinine: 3.65 mg/dL, Ca: 7.7 mg/dL, GFR: 15 mL/min.  NUTRITION - FOCUSED PHYSICAL EXAM:    Most Recent Value  Orbital Region  No depletion  Upper Arm Region  No depletion  Thoracic and Lumbar Region  No depletion  Buccal Region  No depletion  Temple Region  No depletion  Clavicle Bone Region  No depletion  Clavicle and Acromion Bone Region  No depletion  Scapular Bone Region  Unable to assess  Dorsal Hand  No depletion  Patellar Region   Unable to assess  Anterior Thigh Region  Unable to assess  Posterior Calf Region  Unable to assess  Edema (RD Assessment)  Unable to assess  Hair  Unable to assess  Eyes  Reviewed  Mouth  Unable to assess  Skin  Reviewed  Nails  Reviewed       Diet Order:  Diet full liquid Room service appropriate? Yes; Fluid consistency: Thin  EDUCATION NEEDS:   Not appropriate for education at this time  Skin:  Skin Assessment: Skin Integrity Issues: Skin Integrity Issues:: Stage I Stage I: L buttocks  Last BM:  11/13  Height:   Ht Readings from Last 1 Encounters:  09/14/17 6\' 2"  (1.88 m)    Weight:   Wt Readings from Last 1 Encounters:  09/17/17 285 lb 4.4 oz (129.4 kg)    Ideal Body Weight:  86.36 kg  BMI:  Body mass index is 36.63 kg/m.  Estimated Nutritional Needs:   Kcal:  2100-2335 (18-20 kcal/kg)  Protein:  117-128 grams (1-1.1 grams/kg)  Fluid:  >/= 2.3 L/day    Jarome Matin, MS, RD, LDN, [facility] Inpatient Clinical Dietitian Pager # [phone] After hours/weekend pager # [phone]

## 2017-09-17 NOTE — Consult Note (Signed)
   [facility] CM Inpatient Consult   09/17/2017  [name] 1946/04/25 [id number]    Patient screened for potential [facility] Care Management program services.   Chart reviewed. Currently in stepdown unit.   Noted Primary Care Provider's office is listed as doing their own transition of care calls post hospitalization.   Will continue to follow and engage if [place] Management services are warranted.    Marthenia Rolling, MSN-Ed, RN,BSN [facility] Liaison [phone]

## 2017-09-17 NOTE — Progress Notes (Signed)
PULMONARY / CRITICAL CARE MEDICINE   Name: [name] MRN: [id number] DOB: 26-Nov-1945 PCP [name], MD LOS 3 as of 09/17/2017     ADMISSION DATE:  09/14/2017 CONSULTATION DATE:  09/17/2017   REFERRING MD:  Dr Susie Cassette of [facility]  CHIEF COMPLAINT:  Septic shock  HISTORY OF PRESENT ILLNESS:   71 year old male history is given by his wife, bedside nurse Judson Roch and also review of the chart  He has colon cancer undergoing chemotherapy.  He initially presented to the oncology clinic on September 11, 2017 with a history of diminished p.o. intake for 2 weeks and noticed to be hyponatremic with a creatinine of 1.9 mg percent and a BUN greater than 20 with a white count greater than 23,000 and a low potassium of 2.7.  When evaluated by hospitalist service on September 11, 2017 he denied any coughing, sputum production dysuria fevers or rashes or open sores.  Chemotherapy was held dehydration secondary to anorexia was suspected and IV fluid and electrolyte replacement was started.  At that time patient was normotensive.  History of atrial fibrillation that was rate controlled was also noted but his INR was slightly high.  He was then discharged from the emergency department  He represented on September 14, 2017.  Apparently initially he was improving after discharge from the emergency room on the seventh.  But he started developing congested and started having cough and weakness but denied any fever chills weakness dizziness chest pain shortness of breath abdominal pain dysuria or altered mental status.  This time he was admitted and upon admission it was felt that he might be in pulmonary congestion [he follows at [facility] with Dr. Einar Gip and his 2018 echo and cardiac stress test were normal according to history obtained today] he was given Lasix and also broad-spectrum antibiotics and placed on Cardizem drip for atrial fibrillation.  But subsequently has declined with tachypnea and also  circulatory hypotension not responding to initial small bolus of fluids and transferred to the intensive care unit.  Upon critical care medicine evaluation he admitted to fatigue and also was having diarrhea which the wife says is chronic because of colon cancer therapy.  His white count is high.  And chest x-ray more consistent with possible left lower lobe pneumonia   Interval/subjective Feels much better, jovial and making jokes at bedside.   VITAL SIGNS: BP 132/60   Pulse (!) 112   Temp 97.7 F (36.5 C) (Oral)   Resp (!) 24   Ht 6\' 2"  (1.88 m)   Wt 273 lb 13 oz (124.2 kg)   SpO2 99%   BMI 35.16 kg/m  Nasal cannula  Intake/Output Summary (Last 24 hours) at 09/17/2017 0851 Last data filed at 09/17/2017 0500 Gross per 24 hour  Intake 4644.36 ml  Output 1155 ml  Net 3489.36 ml     EXAM General: 71 year old white male currently resting in bed no acute distress HEENT normocephalic atraumatic mucous membranes are moist phonation quality is much improved Pulmonary: Scattered rhonchi, improved air quality of movement some expiratory wheeze upper airway noises much improved no accessory muscle use Cardiac regular rate and rhythm controlled A. fib on telemetry now off pressors Abdomen: Soft nontender still has copious liquid stool from Flexi-Seal tolerating clear liquids Extremities/musculoskeletal: Strength is improved some, however still remains weak he does have generalized anasarca, he is warm, he has brisk cap refill Neuro/psych: Awake alert oriented x3 moves all extremities.  Overall affect much improved  LABS  PULMONARY Recent Labs  Lab 09/15/17 1142 09/15/17 1510 09/16/17 0305  PHART  --  7.324* 7.337*  PCO2ART  --  27.5* 31.5*  PO2ART  --  73.4* 74.3*  HCO3 7.9* 13.9* 16.4*  O2SAT 35.8 93.6 93.5    CBC Recent Labs  Lab 09/15/17 0951 09/16/17 0300 09/17/17 0500  HGB 10.8* 10.3* 9.0*  HCT 34.4* 32.1* 27.9*  WBC 31.6* 42.4* 23.1*  PLT 263 330 231     COAGULATION Recent Labs  Lab 09/14/17 1746 09/15/17 0756 09/15/17 0951 09/16/17 0300 09/17/17 0030  INR 1.67 2.10 2.37 3.28 2.90    CARDIAC   Recent Labs  Lab 09/14/17 1628 09/15/17 0951 09/16/17 0300  TROPONINI <0.03 0.04* 0.06*   No results for input(s): PROBNP in the last 168 hours.   CHEMISTRY Recent Labs  Lab 09/12/17 0356 09/13/17 0548 09/14/17 1628 09/15/17 0756 09/15/17 0951 09/16/17 0300 09/17/17 0500  NA 135 138 136 139 140 141 143  K 2.7* 2.9* 3.5 3.5 3.7 3.1* 3.2*  CL 109 115* 113* 114* 116* 113* 117*  CO2 17* 16* 14* 16* 16* 18* 18*  GLUCOSE 120* 114* 176* 136* 141* 135* 92  BUN 28* 19 16 25* 26* 31* 36*  CREATININE 1.53* 1.14 1.10 2.36* 2.53* 3.10* 3.65*  CALCIUM 8.2* 7.9* 8.6* 8.2* 8.1* 7.6* 7.7*  MG 1.5* 1.8 1.7  --   --  1.6* 1.7  PHOS  --   --  2.9  --   --  3.5 3.8   Estimated Creatinine Clearance: 25.2 mL/min (A) (by C-G formula based on SCr of 3.65 mg/dL (H)).   LIVER Recent Labs  Lab 09/11/17 0949  09/12/17 0356  09/14/17 1746 09/15/17 0756 09/15/17 0951 09/16/17 0300 09/17/17 0030  AST 24  --  26  --   --   --  21 28  --   ALT 27  --  25  --   --   --  23 22  --   ALKPHOS 93  --  83  --   --   --  79 88  --   BILITOT 0.77  --  0.7  --   --   --  1.0 0.8  --   PROT 5.9*  --  5.2*  --   --   --  5.0* 4.9*  --   ALBUMIN 2.5*  --  2.4*  --   --   --  2.0* 1.9*  --   INR  --    < > 11.58*   < > 1.67 2.10 2.37 3.28 2.90   < > = values in this interval not displayed.     INFECTIOUS Recent Labs  Lab 09/14/17 1746 09/15/17 0951 09/15/17 1652  LATICACIDVEN 1.4 1.3 1.2  PROCALCITON  --  2.14  --      ENDOCRINE CBG (last 3)  No results for input(s): GLUCAP in the last 72 hours.    IMAGING x48h  - image(s) personally visualized  -   highlighted in bold Ct Chest Wo Contrast  Result Date: 09/16/2017 CLINICAL DATA:  Patient is undergoing chemotherapy for colon carcinoma. More recently, patient hospitalized for  cough, congestion and weakness. EXAM: CT CHEST WITHOUT CONTRAST TECHNIQUE: Multidetector CT imaging of the chest was performed following the standard protocol without IV contrast. COMPARISON:  Chest radiograph, 09/15/2017.  Chest CT, 04/25/2017. FINDINGS: Cardiovascular: Heart top-normal in size. There are mild left coronary artery calcifications. Great vessels normal in caliber. Mild atherosclerotic calcifications noted  along the aortic arch and descending thoracic aorta. Mediastinum/Nodes: There are scattered subcentimeter shotty mediastinal lymph nodes. These are similar to the prior CT. No neck base or axillary masses or adenopathy. No mediastinal or hilar masses. The trachea is patent. There is material within the left mainstem bronchus extending to the Lower and upper lobe bronchi. This is most likely mucus. Lungs/Pleura: Small left and minimal right pleural effusions. Partial left lower lobe atelectasis. There is hazy airspace opacity in the central left upper lobe in a peribronchovascular distribution, with associated linear type opacities. A component of infection is suspected, associated with atelectasis. Some pleural fluid tracks along the left oblique fissure. There is milder dependent opacity in the right lower lobe consistent with atelectasis. Remainder of the right lung is clear. There is no evidence of pulmonary edema. No pneumothorax. Upper Abdomen: No acute abnormality. Musculoskeletal: No fracture or acute finding. No osteoblastic or osteolytic lesions. IMPRESSION: 1. There is material in the left mainstem bronchus extending into the upper and lower lobe bronchi. This is most likely mucous plugging. It leads to partial atelectasis of the left lower lobe with associated volume loss. 2. Hazy type airspace opacities are seen in the central left upper lobe associated with linear opacities. This is likely a combination of infection and subsegmental atelectasis. 3. Mild dependent atelectasis in the  right lower lobe. 4. Small left and minimal right pleural effusions. 5. Mild coronary artery calcifications. Mild aortic atherosclerosis. Aortic Atherosclerosis (ICD10-I70.0). Electronically Signed   By: Lajean Manes M.D.   On: 09/16/2017 16:33   Dg Chest Port 1 View  Result Date: 09/16/2017 CLINICAL DATA:  71 y/o  M; respiratory distress. EXAM: PORTABLE CHEST 1 VIEW COMPARISON:  09/15/2017 chest radiograph. FINDINGS: Large increased left pleural effusion and worsening atelectasis of left lung. Clear right lung. Cardiac silhouette obscured by left diffusion. Left central venous catheter tip projects over mid S be seen. No acute osseous abnormality is evident. IMPRESSION: Increased large left pleural effusion and worsening atelectasis of left lung. Clear right lung. Electronically Signed   By: Kristine Garbe M.D.   On: 09/16/2017 01:34   Dg Chest Port 1 View  Result Date: 09/15/2017 CLINICAL DATA:  Shortness of Breath EXAM: PORTABLE CHEST 1 VIEW COMPARISON:  September 14, 2017 FINDINGS: There is airspace consolidation in the left lower lobe with small left pleural effusion. Right lung is clear. Heart is mildly enlarged with pulmonary vascularity within normal limits. No adenopathy. Port-A-Cath tip is in superior vena cava. There is aortic atherosclerosis. No bone lesions. IMPRESSION: Airspace consolidation consistent with pneumonia left base, increased from 1 day prior. Small left pleural effusion. Right lung clear. Stable cardiac prominence.  There is aortic atherosclerosis. Aortic Atherosclerosis (ICD10-I70.0). Electronically Signed   By: Lowella Grip III M.D.   On: 09/15/2017 09:48     ASSESSMENT and PLAN 71 year old male patient with history of colon cancer now being admitted for healthcare associated pneumonia, associated large left pleural effusion and septic shock.  His shock is now resolved clinically looks much improved; white blood cell count has cut almost in half he feels  better his phonation quality is better.  CT of chest verified bedside ultrasound demonstrating very little pleural effusion, certainly not enough to warrant sampling.  Really the only concern at this point is continued rising creatinine.  Hopefully this will plateau.  Plan for today is to continue for treatment of his pneumonia, continue pulmonary hygiene measures, we will add Lomotil for his diarrhea as well as  oral bicarbonate supplementation to make up for bicarbonate loss from this.  We will KVO his IV fluids, and then reassess for resuming diuretics in a.m.  He is stable to transfer back to the medical service critical care will be available as needed  ASSESSMENT / PLAN:  Acute respiratory failure in the setting of healthcare associated pneumonia with associated left pleural effusion CT chest demonstrates primarily mucus plugging and pneumonia very small effusion confirming ultrasound findings on 11/12 Clinically and radiographically he is improved dramatically Plan Wean oxygen Mobilize Day #2 Zyvox and meropenem; planning 7-day course Continue daily hypertonic saline neb, and flutter valve Repeat chest x-ray in a.m.  Septic shock in the setting of pneumonia Atrial fibrillation with controlled ventricular rate Plan KVO IV fluids Holding antihypertensives and diuretics at this time, however likely resume diuretics in a.m. Ask pharmacy to resume warfarin  Acute kidney injury with normal anion gap metabolic acidosis. Suspect that this is a mixed of bicarbonate loss from diarrhea as well as hyperchloremia.  Creatinine worse, approximately 10 L positive since admission; now hemodynamically stable; we discontinued vancomycin and Zosyn on 11/12 hoping to minimize nephrotoxin effect Plan Continue renal dose medications Will consider initiation of Lasix 11/14 if remains hemodynamically stable KVO IV fluids We will give him oral bicarbonate replacement to make up for bicarbonate loss from  diarrhea Follow-up a.m. Chemistry  Hypokalemia Plan Replace and recheck in morning  Colon cancer undergoing chemotherapy and likely chronic diarrhea.  -Continues to have marked diarrhea.  So far C. difficile toxin and PCR negative.   -GI panel was negative  plan Continue Flexi-Seal Add Lomotil  Anemia of chronic disease and cancer Severe leukocytosis, had been getting Neulasta, now acutely ill.  Had history of diarrhea but C. difficile toxin and PCR were negative.  No evidence of bleeding plan Trend CBC Transfuse for protocol  Warfarin induced coagulopathy Plan Okay to resume warfarin per pharmacy  Oral thrush with odynophagia and mucositis Plan Continue oral nystatin times 10-14 days (started 11/12)  Protein calorie malnutrition Plan We will ask dietitian to see him, he is not ready for solids yet, although he can have that from my standpoint Perhaps he can have some nutritional supplementation via shake or pudding form  Severe deconditioning Plan Mobilize today We will also ask physical therapy to see him  Erick Colace ACNP-BC [place] Pager # [phone] OR # [phone] if no answer  Attending Note:  71 year old male with septic shock in the setting of pneumonia.  On exam, decreased BS on the left.  I reviewed chest CT myself, minimal effusion noted.  Discussed with PCCM-NP and TRH-MD.  Pneumonia:  - Merrem  - Linezolid  - F/U on cultures  Septic shock  - D/C pressors  - IVF resuscitation as needed.  Pleural effusion:   - No thora, not enough to tap  Malnutrition:  - Dietitian to see  AKI:  - BMET in AM  - Replace electrolytes as indicated  Diarrhea, no c diff  - D/C isolation  - Continue abx as above  Transfer to SDU and to [facility] service with PCCM off 11/14.  Patient seen and examined, agree with above note.  I dictated the care and orders written for this patient under my direction.  Rush Farmer, [place]

## 2017-09-17 NOTE — Progress Notes (Signed)
Brief Pharmacy Note re: Heparin  For complete note, see progress note from earlier today by Lavell Luster, PharmD  O:  Heparin level 0.22 on 1650 units/hr (goal 0.3-0.7)       No bleeding or infusion related issues reported by RN       Heparin was off ~2.5hrs earlier today for thoracentesis (1st level after gtt resumed)  A/P:  Heparin level is below goal range.  Increase heparin 1800 units/hr.  Re-check heparin level in 8 hrs.   Netta Cedars, PharmD, BCPS 09/17/2017@1 :21 AM

## 2017-09-17 NOTE — Progress Notes (Signed)
ANTICOAGULATION CONSULT NOTE   Pharmacy Consult for warfarin Indication: atrial fibrillation  No Known Allergies  Patient Measurements: Height: 6\' 2"  (188 cm) Weight: 273 lb 13 oz (124.2 kg) IBW/kg (Calculated) : 82.2 HEPARIN DW (KG): 106.9  Vital Signs: Temp: 97.7 F (36.5 C) (11/13 0800) Temp Source: Oral (11/13 0800) Pulse Rate: 112 (11/13 0500)  Labs: Recent Labs    09/14/17 1628  09/15/17 0756 09/15/17 0951 09/15/17 1652 09/16/17 0300 09/17/17 0030 09/17/17 0500  HGB 12.9*  --  10.9* 10.8*  --  10.3*  --  9.0*  HCT 39.9  --  33.8* 34.4*  --  32.1*  --  27.9*  PLT 291  --  266 263  --  330  --  231  APTT  --   --  43* 42*  --   --   --   --   LABPROT  --    < > 23.4* 25.7*  --  33.1* 30.1*  --   INR  --    < > 2.10 2.37  --  3.28 2.90  --   HEPARINUNFRC  --   --   --   --  0.19* 0.46 0.22*  --   CREATININE 1.10  --  2.36* 2.53*  --  3.10*  --  3.65*  TROPONINI <0.03  --   --  0.04*  --  0.06*  --   --    < > = values in this interval not displayed.   Estimated Creatinine Clearance: 25.2 mL/min (A) (by C-G formula based on SCr of 3.65 mg/dL (H)).  Medications:   Scheduled:  . chlorhexidine  15 mL Mouth Rinse BID  . Chlorhexidine Gluconate Cloth  6 each Topical Daily  . dorzolamide  1 drop Both Eyes BID   And  . timolol  1 drop Both Eyes BID  . famotidine  20 mg Oral QHS  . mouth rinse  15 mL Mouth Rinse q12n4p  . nystatin  5 mL Oral QID  . potassium chloride SA  40 mEq Oral Q4H  . sodium bicarbonate  650 mg Oral BID  . sodium chloride HYPERTONIC  4 mL Nebulization Daily   Assessment: 71 yoM readmitted 11/10 pm for worsening SOB, non-productive cough, LE edema. Prev admit 11/7-9 for hypoNa/AKI. Treating for HCAP and Afib.  Hx: Colon Ca, HTN, HPL Afib on Warfarin, LD 11/9, home dose 2mg  daily, was d/c on 1mg  daily  Today, 09/17/2017 -INR still therapeutic despite last warfarin dose of 1mg  given 11/10 and none since and FFP given yesterday 11/12 in prep  for thoracentesis and INR of 3.28 -No bleeding or infusion related issues reported by RN - To d/c IV heparin due to therapeutic INR and continue warfarin dosing per RX  Goal of Therapy:  INR 2-3  Plan:  1) Warfarin 0.5mg  today at 6pm 2) Daily INR   Adrian Saran, PharmD, BCPS Pager [phone] 09/17/2017 9:25 AM

## 2017-09-18 ENCOUNTER — Ambulatory Visit: Payer: Medicare HMO

## 2017-09-18 ENCOUNTER — Ambulatory Visit: Payer: Medicare HMO | Admitting: Nurse Practitioner

## 2017-09-18 ENCOUNTER — Other Ambulatory Visit: Payer: Medicare HMO

## 2017-09-18 DIAGNOSIS — Z7901 Long term (current) use of anticoagulants: Secondary | ICD-10-CM

## 2017-09-18 DIAGNOSIS — I482 Chronic atrial fibrillation: Secondary | ICD-10-CM

## 2017-09-18 DIAGNOSIS — C189 Malignant neoplasm of colon, unspecified: Secondary | ICD-10-CM

## 2017-09-18 LAB — CBC WITH DIFFERENTIAL/PLATELET
BASOS ABS: 0.1 10*3/uL (ref 0.0–0.1)
BASOS PCT: 0 %
EOS ABS: 0.1 10*3/uL (ref 0.0–0.7)
EOS PCT: 0 %
HCT: 30 % — ABNORMAL LOW (ref 39.0–52.0)
HEMOGLOBIN: 9.7 g/dL — AB (ref 13.0–17.0)
Lymphocytes Relative: 5 %
Lymphs Abs: 1.1 10*3/uL (ref 0.7–4.0)
MCH: 25.3 pg — ABNORMAL LOW (ref 26.0–34.0)
MCHC: 32.3 g/dL (ref 30.0–36.0)
MCV: 78.3 fL (ref 78.0–100.0)
MONO ABS: 2.8 10*3/uL — AB (ref 0.1–1.0)
Monocytes Relative: 12 %
Neutro Abs: 19 10*3/uL — ABNORMAL HIGH (ref 1.7–7.7)
Neutrophils Relative %: 83 %
Platelets: 211 10*3/uL (ref 150–400)
RBC: 3.83 MIL/uL — ABNORMAL LOW (ref 4.22–5.81)
RDW: 21.8 % — AB (ref 11.5–15.5)
WBC: 23 10*3/uL — ABNORMAL HIGH (ref 4.0–10.5)

## 2017-09-18 LAB — CULTURE, RESPIRATORY W GRAM STAIN

## 2017-09-18 LAB — MAGNESIUM: Magnesium: 1.7 mg/dL (ref 1.7–2.4)

## 2017-09-18 LAB — CULTURE, RESPIRATORY

## 2017-09-18 LAB — CREATININE, SERUM
Creatinine, Ser: 3.32 mg/dL — ABNORMAL HIGH (ref 0.61–1.24)
GFR, EST AFRICAN AMERICAN: 20 mL/min — AB (ref >60)
GFR, EST NON AFRICAN AMERICAN: 17 mL/min — AB (ref >60)

## 2017-09-18 LAB — PROTIME-INR
INR: 3.07
PROTHROMBIN TIME: 31.5 s — AB (ref 11.4–15.2)

## 2017-09-18 LAB — PHOSPHORUS: Phosphorus: 3.7 mg/dL (ref 2.5–4.6)

## 2017-09-18 MED ORDER — LEVALBUTEROL HCL 1.25 MG/0.5ML IN NEBU: 1.2500 mg | INHALATION_SOLUTION | Freq: Three times a day (TID) | RESPIRATORY_TRACT | Status: DC

## 2017-09-18 MED ORDER — GUAIFENESIN ER 600 MG PO TB12
600.0000 mg | ORAL_TABLET | Freq: Two times a day (BID) | ORAL | Status: DC
  Administered 2017-09-18 – 2017-09-24 (×11): 600 mg via ORAL
  Filled 2017-09-18 (×12): qty 1

## 2017-09-18 MED ORDER — IPRATROPIUM BROMIDE 0.02 % IN SOLN: 0.5000 mg | Freq: Four times a day (QID) | RESPIRATORY_TRACT | Status: DC | PRN

## 2017-09-18 MED ORDER — IPRATROPIUM BROMIDE 0.02 % IN SOLN
0.5000 mg | Freq: Three times a day (TID) | RESPIRATORY_TRACT | Status: DC
  Administered 2017-09-18 – 2017-09-24 (×18): 0.5 mg via RESPIRATORY_TRACT
  Filled 2017-09-18 (×18): qty 2.5

## 2017-09-18 MED ORDER — DILTIAZEM HCL-DEXTROSE 100-5 MG/100ML-% IV SOLN (PREMIX)
5.0000 mg/h | INTRAVENOUS | Status: DC
  Administered 2017-09-18 (×2): 10 mg/h via INTRAVENOUS
  Administered 2017-09-18: 15 mg/h via INTRAVENOUS
  Administered 2017-09-18: 5 mg/h via INTRAVENOUS
  Administered 2017-09-19: 10 mg/h via INTRAVENOUS
  Filled 2017-09-18 (×4): qty 100

## 2017-09-18 MED ORDER — LEVALBUTEROL HCL 1.25 MG/0.5ML IN NEBU: 1.2500 mg | INHALATION_SOLUTION | Freq: Four times a day (QID) | RESPIRATORY_TRACT | Status: DC | PRN

## 2017-09-18 MED ORDER — MAGNESIUM SULFATE IN D5W 1-5 GM/100ML-% IV SOLN
1.0000 g | Freq: Once | INTRAVENOUS | Status: AC
  Administered 2017-09-18: 1 g via INTRAVENOUS
  Filled 2017-09-18: qty 100

## 2017-09-18 MED ORDER — LEVALBUTEROL HCL 0.63 MG/3ML IN NEBU
0.6300 mg | INHALATION_SOLUTION | RESPIRATORY_TRACT | Status: DC | PRN
  Administered 2017-09-19 (×2): 0.63 mg via RESPIRATORY_TRACT
  Filled 2017-09-18 (×2): qty 3

## 2017-09-18 MED ORDER — IPRATROPIUM BROMIDE 0.02 % IN SOLN: 0.5000 mg | Freq: Three times a day (TID) | RESPIRATORY_TRACT | Status: DC

## 2017-09-18 MED ORDER — LEVALBUTEROL HCL 1.25 MG/0.5ML IN NEBU
1.2500 mg | INHALATION_SOLUTION | Freq: Three times a day (TID) | RESPIRATORY_TRACT | Status: DC
  Administered 2017-09-18 – 2017-09-24 (×18): 1.25 mg via RESPIRATORY_TRACT
  Filled 2017-09-18 (×18): qty 0.5

## 2017-09-18 NOTE — Progress Notes (Addendum)
PROGRESS NOTE  [name] IFO:[id number] DOB: 1946/03/27 DOA: 09/14/2017 PCP: [name], MD   Brief summary:  H/o afib on coumadin, H/o colon cancer (II and IIIb) s/p colectomy under adjuvent chemotherapy cycle 6, sent from infusion center due to hypotension, leukocytosis, diminished oral intake for 2 weeks. He was admitted to hospitalist service, then  Transferred to icu due to severe sepsis/pna/hypotension, afib/rvr. He received vanc/zosyn initially, due to wrosening of renal function, abx changed to zyvox and imipenem , plan for total of 7 days, last dose on 11/19.  Improving, transfer back to hospitalist service on 11/14   HPI/Recap of past 24 hours:  Congested, cough, edematous, no fever, diarrhea slowed down, oral thrush better O2, foley, rectal tube with watery stool Assessment/Plan: Active Problems:   Sepsis due to pneumonia ([place])   Pressure injury of skin    Sepsis/septic shock/pna/acute hypoxia: -CT chest demonstrates primarily mucus plugging and pneumonia very small effusion confirming ultrasound findings on 11/12 -required fluids resuscitation and neo-synephrine from 11/11 to 11/13 -attempted US guided therapeutic and diagnostic thoracentesis on 11/12 aborted due to minimal fluids -blood culture negative, strep pneumo antigen and leginella antigen negative.  -improving, continue abx   Afib/rvr cardizem drip, off heparin drip (was on heparin drip due to needing thoracentesis), restarted coumadin -cardiology Dr Virgina Jock consulted -echo pending  Generalized edema:  Likely fluids overload from resuscitation echopending Minimize iv fluids Consider lasix   AKI: From spesis? Renal US on 11/8 no obstruction Cr seems peaked at 3.65 Continue foley for another day   Oral candidiasis:  Oral candidiasis probably caused Sputum + candida  Topical nystatin Denies dysphagia  HTN/HLD: Admitted with sepsis Currently on cardizem drip for afib Home meds held  for now   Colon ca (II and IIIb) : -s/p colectomy under adjuvent chemotherapy cycle 5 with Folfox on 10/24, planned cycle 6 Folfox cancelled due to  Hospitalization. -oncology aware of hospitalization  Code Status: full  Family Communication: patient and significant other at bedside  Disposition Plan: remain in icu/stepdown today, likely able to transfer to med tele tomorrow   Consultants:  Critical care  Cardiology  oncology  Procedures: Aline placement on 11/11 20 gauge catheter was inserted into right radial artery using the Seldinger technique. Aline removed on 11/14        FFP on 11/12  Attempted thoracentesis on 11/12  Antibiotics:  Vanc/zosyn initially  Zyvox/meropenem from 11/12, plan to finish total of 7 days treatment   Objective: BP 102/81   Pulse (!) 117   Temp 98.4 F (36.9 C) (Oral)   Resp (!) 25   Ht 6\' 2"  (1.88 m)   Wt 129.3 kg (285 lb 0.9 oz)   SpO2 99%   BMI 36.60 kg/m   Intake/Output Summary (Last 24 hours) at 09/18/2017 0756 Last data filed at 09/18/2017 0600 Gross per 24 hour  Intake 1416 ml  Output 1780 ml  Net -364 ml   Filed Weights   09/16/17 0500 09/17/17 1200 09/18/17 0500  Weight: 124.2 kg (273 lb 13 oz) 129.4 kg (285 lb 4.4 oz) 129.3 kg (285 lb 0.9 oz)    Exam: Patient is examined daily including today on 09/18/2017, exams remain the same as of yesterday except that has changed    General:  Frail, occasionally unproductive cough, oral thrush is improving, foley in place  Cardiovascular: IRRR,   Respiratory: diminished at basis, no wheezing, no rales, no rhonchi, chest port in place  Abdomen: Soft/ND/NT, positive BS, well  healed surgical scar  Musculoskeletal: bilateral lower extremity pitting Edema, right arm edema  Neuro: alert, oriented   Data Reviewed: Basic Metabolic Panel: Recent Labs  Lab 09/13/17 0548 09/14/17 1628 09/15/17 0756 09/15/17 0951 09/16/17 0300 09/17/17 0500 09/17/17 1126  09/18/17 0313  NA 138 136 139 140 141 143  --   --   K 2.9* 3.5 3.5 3.7 3.1* 3.2*  --   --   CL 115* 113* 114* 116* 113* 117*  --   --   CO2 16* 14* 16* 16* 18* 18*  --   --   GLUCOSE 114* 176* 136* 141* 135* 92  --   --   BUN 19 16 25* 26* 31* 36*  --   --   CREATININE 1.14 1.10 2.36* 2.53* 3.10* 3.65* 3.55*  --   CALCIUM 7.9* 8.6* 8.2* 8.1* 7.6* 7.7*  --   --   MG 1.8 1.7  --   --  1.6* 1.7  --  1.7  PHOS  --  2.9  --   --  3.5 3.8  --  3.7   Liver Function Tests: Recent Labs  Lab 09/11/17 0949 09/12/17 0356 09/15/17 0951 09/16/17 0300  AST 24 26 21 28   ALT 27 25 23 22   ALKPHOS 93 83 79 88  BILITOT 0.77 0.7 1.0 0.8  PROT 5.9* 5.2* 5.0* 4.9*  ALBUMIN 2.5* 2.4* 2.0* 1.9*   No results for input(s): LIPASE, AMYLASE in the last 168 hours. No results for input(s): AMMONIA in the last 168 hours. CBC: Recent Labs  Lab 09/14/17 1628 09/15/17 0756 09/15/17 0951 09/16/17 0300 09/17/17 0500 09/18/17 0313  WBC 35.8* 33.5* 31.6* 42.4* 23.1* 23.0*  NEUTROABS 30.5*  --  27.1* 36.9* 19.6* 19.0*  HGB 12.9* 10.9* 10.8* 10.3* 9.0* 9.7*  HCT 39.9 33.8* 34.4* 32.1* 27.9* 30.0*  MCV 77.9* 79.0 79.3 77.7* 77.9* 78.3  PLT 291 266 263 330 231 211   Cardiac Enzymes:   Recent Labs  Lab 09/14/17 1628 09/15/17 0951 09/16/17 0300  TROPONINI <0.03 0.04* 0.06*   BNP (last 3 results) Recent Labs    09/14/17 1628  BNP 505.8*    ProBNP (last 3 results) No results for input(s): PROBNP in the last 8760 hours.  CBG: No results for input(s): GLUCAP in the last 168 hours.  Recent Results (from the past 240 hour(s))  Culture, blood (routine x 2)     Status: None   Collection Time: 09/11/17  6:43 PM  Result Value Ref Range Status   Specimen Description BLOOD LEFT ANTECUBITAL  Final   Special Requests   Final    BOTTLES DRAWN AEROBIC ONLY Blood Culture adequate volume   Culture   Final    NO GROWTH 5 DAYS Performed at [facility], [address], [place], [place] [address]     Report Status 09/16/2017 FINAL  Final  Culture, blood (routine x 2)     Status: None   Collection Time: 09/11/17  6:55 PM  Result Value Ref Range Status   Specimen Description BLOOD RIGHT ANTECUBITAL  Final   Special Requests IN PEDIATRIC BOTTLE Blood Culture adequate volume  Final   Culture   Final    NO GROWTH 5 DAYS Performed at [facility], [place] [address], [place], [place] [address]    Report Status 09/16/2017 FINAL  Final  Culture, expectorated sputum-assessment     Status: None   Collection Time: 09/14/17 10:34 PM  Result Value Ref Range  Status   Specimen Description EXPECTORATED SPUTUM  Final   Special Requests Immunocompromised  Final   Sputum evaluation THIS SPECIMEN IS ACCEPTABLE FOR SPUTUM CULTURE  Final   Report Status 09/14/2017 FINAL  Final  Culture, respiratory (NON-Expectorated)     Status: None (Preliminary result)   Collection Time: 09/14/17 10:34 PM  Result Value Ref Range Status   Specimen Description EXPECTORATED SPUTUM  Final   Special Requests Immunocompromised Reflexed from [id number]  Final   Gram Stain   Final    ABUNDANT WBC PRESENT, PREDOMINANTLY PMN FEW SQUAMOUS EPITHELIAL CELLS PRESENT MODERATE YEAST WITH PSEUDOHYPHAE MODERATE GRAM POSITIVE RODS RARE GRAM NEGATIVE RODS    Culture   Final    CULTURE REINCUBATED FOR BETTER GROWTH Performed at [facility], [place] [address], [place], [place] [address]    Report Status PENDING  Incomplete  Culture, blood (x 2)     Status: None (Preliminary result)   Collection Time: 09/15/17  9:48 AM  Result Value Ref Range Status   Specimen Description BLOOD RIGHT ANTECUBITAL  Final   Special Requests IN PEDIATRIC BOTTLE Blood Culture adequate volume  Final   Culture   Final    NO GROWTH 2 DAYS Performed at [facility], [place] [address] [place], [place] [address]    Report Status PENDING  Incomplete  Culture, blood (x 2)     Status: None (Preliminary result)   Collection Time: 09/15/17  9:48 AM   Result Value Ref Range Status   Specimen Description BLOOD BLOOD RIGHT HAND  Final   Special Requests IN PEDIATRIC BOTTLE Blood Culture adequate volume  Final   Culture   Final    NO GROWTH 2 DAYS Performed at [facility], [place] [address], [place], [place] [address]    Report Status PENDING  Incomplete  C difficile quick scan w PCR reflex     Status: None   Collection Time: 09/15/17 11:42 AM  Result Value Ref Range Status   C Diff antigen NEGATIVE NEGATIVE Final   C Diff toxin NEGATIVE NEGATIVE Final   C Diff interpretation NEGATIVE  Final  Gastrointestinal Panel by PCR , Stool     Status: None   Collection Time: 09/15/17 11:42 AM  Result Value Ref Range Status   Campylobacter species NOT DETECTED NOT DETECTED Final   Plesimonas shigelloides NOT DETECTED NOT DETECTED Final   Salmonella species NOT DETECTED NOT DETECTED Final   Yersinia enterocolitica NOT DETECTED NOT DETECTED Final   Vibrio species NOT DETECTED NOT DETECTED Final   Vibrio cholerae NOT DETECTED NOT DETECTED Final   Enteroaggregative E coli (EAEC) NOT DETECTED NOT DETECTED Final   Enteropathogenic E coli (EPEC) NOT DETECTED NOT DETECTED Final   Enterotoxigenic E coli (ETEC) NOT DETECTED NOT DETECTED Final   Shiga like toxin producing E coli (STEC) NOT DETECTED NOT DETECTED Final   Shigella/Enteroinvasive E coli (EIEC) NOT DETECTED NOT DETECTED Final   Cryptosporidium NOT DETECTED NOT DETECTED Final   Cyclospora cayetanensis NOT DETECTED NOT DETECTED Final   Entamoeba histolytica NOT DETECTED NOT DETECTED Final   Giardia lamblia NOT DETECTED NOT DETECTED Final   Adenovirus F40/41 NOT DETECTED NOT DETECTED Final   Astrovirus NOT DETECTED NOT DETECTED Final   Norovirus GI/GII NOT DETECTED NOT DETECTED Final   Rotavirus A NOT DETECTED NOT DETECTED Final   Sapovirus (I, II, IV, and V) NOT DETECTED NOT DETECTED Final  Respiratory Panel by PCR     Status: None  Collection Time: 09/15/17  3:25 PM  Result Value  Ref Range Status   Adenovirus NOT DETECTED NOT DETECTED Final   Coronavirus 229E NOT DETECTED NOT DETECTED Final   Coronavirus HKU1 NOT DETECTED NOT DETECTED Final   Coronavirus NL63 NOT DETECTED NOT DETECTED Final   Coronavirus OC43 NOT DETECTED NOT DETECTED Final   Metapneumovirus NOT DETECTED NOT DETECTED Final   Rhinovirus / Enterovirus NOT DETECTED NOT DETECTED Final   Influenza A NOT DETECTED NOT DETECTED Final   Influenza B NOT DETECTED NOT DETECTED Final   Parainfluenza Virus 1 NOT DETECTED NOT DETECTED Final   Parainfluenza Virus 2 NOT DETECTED NOT DETECTED Final   Parainfluenza Virus 3 NOT DETECTED NOT DETECTED Final   Parainfluenza Virus 4 NOT DETECTED NOT DETECTED Final   Respiratory Syncytial Virus NOT DETECTED NOT DETECTED Final   Bordetella pertussis NOT DETECTED NOT DETECTED Final   Chlamydophila pneumoniae NOT DETECTED NOT DETECTED Final   Mycoplasma pneumoniae NOT DETECTED NOT DETECTED Final    Comment: Performed at [facility], [place] [address] [place], [place] [address]  MRSA PCR Screening     Status: None   Collection Time: 09/15/17  3:25 PM  Result Value Ref Range Status   MRSA by PCR NEGATIVE NEGATIVE Final    Comment:        The GeneXpert MRSA Assay (FDA approved for NASAL specimens only), is one component of a comprehensive MRSA colonization surveillance program. It is not intended to diagnose MRSA infection nor to guide or monitor treatment for MRSA infections.      Studies: No results found.  Scheduled Meds: . chlorhexidine  15 mL Mouth Rinse BID  . Chlorhexidine Gluconate Cloth  6 each Topical Daily  . dorzolamide  1 drop Both Eyes BID   And  . timolol  1 drop Both Eyes BID  . famotidine  20 mg Oral QHS  . feeding supplement (ENSURE ENLIVE)  237 mL Oral BID BM  . ipratropium-albuterol  3 mL Nebulization TID  . mouth rinse  15 mL Mouth Rinse q12n4p  . multivitamin with minerals  1 tablet Oral Daily  . nystatin  5 mL Oral QID  .  sodium bicarbonate  650 mg Oral BID  . sodium chloride HYPERTONIC  4 mL Nebulization Daily  . Warfarin - Pharmacist Dosing Inpatient   Does not apply q1800    Continuous Infusions: . diltiazem (CARDIZEM) infusion 15 mg/hr (09/18/17 0743)  . lactated ringers 10 mL/hr at 09/18/17 0600  . linezolid (ZYVOX) IV Stopped (09/17/17 2335)  . meropenem (MERREM) IV Stopped (09/18/17 0236)     Time spent: 79mins I have personally reviewed and interpreted on  09/18/2017 daily labs, tele strips, imagings as discussed above under date review session and assessment and plans.  I reviewed all nursing notes, pharmacy notes, consultant notes,  vitals, pertinent old records  I have discussed plan of care as described above with RN , patient and family on 09/18/2017   Daveon Arpino MD, PhD  [facility] Pager [phone]. If 7PM-7AM, please contact night-coverage at [email or web address], password [facility] 09/18/2017, 7:56 AM  LOS: 4 days

## 2017-09-18 NOTE — Progress Notes (Signed)
ANTICOAGULATION CONSULT NOTE   Pharmacy Consult for warfarin Indication: atrial fibrillation  No Known Allergies  Patient Measurements: Height: 6\' 2"  (188 cm) Weight: 285 lb 0.9 oz (129.3 kg) IBW/kg (Calculated) : 82.2 HEPARIN DW (KG): 106.9  Vital Signs: Temp: 97.5 F (36.4 C) (11/14 0743) Temp Source: Oral (11/14 0743) BP: 102/81 (11/14 0600) Pulse Rate: 117 (11/14 0743)  Labs: Recent Labs    09/15/17 0951 09/15/17 1652 09/16/17 0300 09/17/17 0030 09/17/17 0500 09/17/17 1126 09/18/17 0313  HGB 10.8*  --  10.3*  --  9.0*  --  9.7*  HCT 34.4*  --  32.1*  --  27.9*  --  30.0*  PLT 263  --  330  --  231  --  211  APTT 42*  --   --   --   --   --   --   LABPROT 25.7*  --  33.1* 30.1*  --   --  31.5*  INR 2.37  --  3.28 2.90  --   --  3.07  HEPARINUNFRC  --  0.19* 0.46 0.22*  --   --   --   CREATININE 2.53*  --  3.10*  --  3.65* 3.55*  --   TROPONINI 0.04*  --  0.06*  --   --   --   --    Estimated Creatinine Clearance: 26.5 mL/min (A) (by C-G formula based on SCr of 3.55 mg/dL (H)).  Medications:   Scheduled:  . chlorhexidine  15 mL Mouth Rinse BID  . Chlorhexidine Gluconate Cloth  6 each Topical Daily  . dorzolamide  1 drop Both Eyes BID   And  . timolol  1 drop Both Eyes BID  . famotidine  20 mg Oral QHS  . feeding supplement (ENSURE ENLIVE)  237 mL Oral BID BM  . guaiFENesin  600 mg Oral BID  . ipratropium-albuterol  3 mL Nebulization TID  . mouth rinse  15 mL Mouth Rinse q12n4p  . multivitamin with minerals  1 tablet Oral Daily  . nystatin  5 mL Oral QID  . sodium bicarbonate  650 mg Oral BID  . Warfarin - Pharmacist Dosing Inpatient   Does not apply q1800   Assessment: 1 yoM readmitted 11/10 pm for worsening SOB, non-productive cough, LE edema. Prev admit 11/7-9 for hypoNa/AKI. Treating for HCAP and Afib.  Hx: Colon Ca, HTN, HPL Afib on Warfarin, LD 11/9, home dose 2mg  daily, was d/c on 1mg  daily  Today, 09/18/2017 -INR again supratherapeutic -  warfarin restarted 11/13 and low dose of 0.5mg  given at 1800 -No bleeding or infusion related issues reported by RN - continued diarrhea - 900 ml last 24hr  Goal of Therapy:  INR 2-3  Plan:  1) no warfarin today 2) Daily INR   Adrian Saran, PharmD, BCPS Pager [phone] 09/18/2017 9:44 AM

## 2017-09-18 NOTE — Evaluation (Signed)
Clinical/Bedside Swallow Evaluation Patient Details  Name: [name] MRN: [id number] Date of Birth: Apr 24, 1946  Today's Date: 09/18/2017 Time: SLP Start Time (ACUTE ONLY): 1406 SLP Stop Time (ACUTE ONLY): 1418 SLP Time Calculation (min) (ACUTE ONLY): 12 min  Past Medical History:  Past Medical History:  Diagnosis Date  . Anxiety   . Dysrhythmia     a fib  . Hyperlipidemia   . Hypertension    managed  . Pneumonia 1950   Past Surgical History:  Past Surgical History:  Procedure Laterality Date  . APPENDECTOMY  1950  . CARDIOVERSION    . EYE SURGERY Bilateral    catract surgery   HPI:  Pt is a 71 year old male undergoing chemotherapy for colon cancer admitted with septic shock secondary to PNA. PMH also includes HTN, HLD, dysrhythmia, anxiety.   Assessment / Plan / Recommendation Clinical Impression  Pt consumed half an Ensure and magic up with one coughing episode following a bite the puree. Cough appeared strong and reactive, potentially suggesting airway compromise; however, no further overt signs of aspiration were observed. Pt/family say that he has not had much intake since he has been here, although they deny any coughing when he did eat or drink. Recommend to continue with current diet textures for now (on full liquids per MD). SLP will f/u for additional skilled observation for assessment of tolerance. SLP Visit Diagnosis: Dysphagia, unspecified (R13.10)    Aspiration Risk  Mild aspiration risk    Diet Recommendation Thin liquid;Other (Comment)(full liquids per MD)   Liquid Administration via: Cup;Straw Medication Administration: Whole meds with liquid Supervision: Patient able to self feed;Intermittent supervision to cue for compensatory strategies Compensations: Slow rate;Small sips/bites Postural Changes: Seated upright at 90 degrees;Remain upright for at least 30 minutes after po intake    Other  Recommendations Oral Care Recommendations: Oral care BID    Follow up Recommendations (tba)      Frequency and Duration min 2x/week  2 weeks       Prognosis Prognosis for Safe Diet Advancement: Good      Swallow Study   General HPI: Pt is a 71 year old male undergoing chemotherapy for colon cancer admitted with septic shock secondary to PNA. PMH also includes HTN, HLD, dysrhythmia, anxiety. Type of Study: Bedside Swallow Evaluation Previous Swallow Assessment: none in chart Diet Prior to this Study: Thin liquids Temperature Spikes Noted: No Respiratory Status: Nasal cannula History of Recent Intubation: No Behavior/Cognition: Alert;Cooperative;Pleasant mood Oral Cavity Assessment: Within Functional Limits Oral Care Completed by SLP: No Oral Cavity - Dentition: Adequate natural dentition Vision: Functional for self-feeding Self-Feeding Abilities: Able to feed self Patient Positioning: Upright in bed Baseline Vocal Quality: Normal Volitional Cough: Strong Volitional Swallow: Able to elicit    Oral/Motor/Sensory Function Overall Oral Motor/Sensory Function: Within functional limits   Ice Chips Ice chips: Not tested   Thin Liquid Thin Liquid: Within functional limits Presentation: Self Fed;Straw    Nectar Thick Nectar Thick Liquid: Not tested   Honey Thick Honey Thick Liquid: Not tested   Puree Puree: Impaired Presentation: Self Fed;Spoon Pharyngeal Phase Impairments: Cough - Immediate(x1)   Solid   GO   Solid: Not tested(pt on liquid diet per MD)        [name] 09/18/2017,2:32 PM  [name], M.A. CCC-SLP [phone]

## 2017-09-18 NOTE — Progress Notes (Signed)
[place] Progress Note Patient Name: [name] DOB: 04/20/1946 MRN: [id number]   Date of Service  09/18/2017  HPI/Events of Note  AFIB with RVR - Ventricular rate = 140's to 150's. Mg++ = 1.7 and Creatinine = 3.55.  eICU Interventions  Will order: 1. Cardizem IV infusion. Titrate to Hr = 65-105. 2. Replace Mg++.     Intervention Category Major Interventions: Arrhythmia - evaluation and management  [name] 09/18/2017, 5:46 AM

## 2017-09-18 NOTE — Evaluation (Signed)
Physical Therapy  Re-Evaluation Patient Details Name: [name] MRN: [id number] DOB: [birth year] Today's Date: 09/18/2017   History of Present Illness  71 yo male admitted with ARF, hypokalemia, supratherapeutic INR. Hx of A fib, colon ca-on chemo, HTN. Septsis and transfer to ICU.  Clinical Impression  The patient requires 2 assist today for basic transfers. Much weaker than on Eval . May benefit from [facility] prior to DC home. Pt admitted with above diagnosis. Pt currently with functional limitations due to the deficits listed below (see PT Problem List). Pt will benefit from skilled PT to increase their independence and safety with mobility to allow discharge to the venue listed below.       Follow Up Recommendations SNF    Equipment Recommendations  None recommended by PT    Recommendations for Other Services   OT    Precautions / Restrictions Precautions Precautions: Fall Precaution Comments: monitor BP      Mobility  Bed Mobility Overal bed mobility: Needs Assistance Bed Mobility: Supine to Sit     Supine to sit: Mod assist     General bed mobility comments: assist with trunk  Transfers Overall transfer level: Needs assistance Equipment used: Rolling walker (2 wheeled) Transfers: Sit to/from [facility] Sit to Stand: Mod assist;+2 physical assistance;+2 safety/equipment Stand pivot transfers: Mod assist;+2 physical assistance;+2 safety/equipment       General transfer comment: close guard for safety. shuffle steps, knees slightly buckle. Pivot to [facility] then recliner. using RW.  Ambulation/Gait                Stairs            Wheelchair Mobility    Modified Rankin (Stroke Patients Only)       Balance                                             Pertinent Vitals/Pain Pain Assessment: No/denies pain    Home Living Family/patient expects to be discharged to:: Private residence Living Arrangements:  Spouse/significant other Available Help at Discharge: Available 24 hours/day;Family Type of Home: House Home Access: Stairs to enter Entrance Stairs-Rails: Psychiatric nurse of Steps: 3 Home Layout: One level Home Equipment: Environmental consultant - 2 wheels      Prior Function Level of Independence: Independent               Hand Dominance        Extremity/Trunk Assessment   Upper Extremity Assessment Upper Extremity Assessment: Generalized weakness    Lower Extremity Assessment Lower Extremity Assessment: Generalized weakness       Communication   Communication: No difficulties  Cognition Arousal/Alertness: Awake/alert Behavior During Therapy: WFL for tasks assessed/performed Overall Cognitive Status: Within Functional Limits for tasks assessed                                        General Comments      Exercises     Assessment/Plan    PT Assessment Patient needs continued PT services  PT Problem List Decreased strength;Decreased mobility;Decreased activity tolerance;Decreased balance;Decreased knowledge of use of DME       PT Treatment Interventions DME instruction;Gait training;Functional mobility training;Therapeutic activities;Balance training;Patient/family education;Therapeutic exercise    PT Goals (Current goals can be found in  the Care Plan section)  Acute Rehab PT Goals Patient Stated Goal: to go home PT Goal Formulation: With patient/family Time For Goal Achievement: 10/02/17 Potential to Achieve Goals: Good    Frequency Min 3X/week   Barriers to discharge        Co-evaluation               AM-PAC PT "6 Clicks" Daily Activity  Outcome Measure Difficulty turning over in bed (including adjusting bedclothes, sheets and blankets)?: Unable Difficulty moving from lying on back to sitting on the side of the bed? : Unable Difficulty sitting down on and standing up from a chair with arms (e.g., wheelchair, bedside  commode, etc,.)?: Unable Help needed moving to and from a bed to chair (including a wheelchair)?: Total Help needed walking in hospital room?: Total Help needed climbing 3-5 steps with a railing? : Total 6 Click Score: 6    End of Session   Activity Tolerance: Patient tolerated treatment well Patient left: in chair;with call bell/phone within reach;with family/visitor present;with chair alarm set   PT Visit Diagnosis: Muscle weakness (generalized) (M62.81);Difficulty in walking, not elsewhere classified (R26.2)    Time: 4037-0964 PT Time Calculation (min) (ACUTE ONLY): 27 min   Charges:   PT Evaluation $PT Re-evaluation: 1 Re-eval PT Treatments $Therapeutic Activity: 8-22 mins   PT G CodesTresa Christensen PT [phone]   [name] 09/18/2017, 4:55 PM

## 2017-09-19 ENCOUNTER — Inpatient Hospital Stay (HOSPITAL_COMMUNITY): Payer: Medicare HMO

## 2017-09-19 DIAGNOSIS — I481 Persistent atrial fibrillation: Secondary | ICD-10-CM

## 2017-09-19 LAB — CBC WITH DIFFERENTIAL/PLATELET
Basophils Absolute: 0 10*3/uL (ref 0.0–0.1)
Basophils Relative: 0 %
EOS PCT: 1 %
Eosinophils Absolute: 0.2 10*3/uL (ref 0.0–0.7)
HEMATOCRIT: 31.9 % — AB (ref 39.0–52.0)
HEMOGLOBIN: 10.2 g/dL — AB (ref 13.0–17.0)
LYMPHS ABS: 1.1 10*3/uL (ref 0.7–4.0)
Lymphocytes Relative: 5 %
MCH: 25.1 pg — AB (ref 26.0–34.0)
MCHC: 32 g/dL (ref 30.0–36.0)
MCV: 78.6 fL (ref 78.0–100.0)
MONO ABS: 2.8 10*3/uL — AB (ref 0.1–1.0)
MONOS PCT: 13 %
NEUTROS ABS: 17.7 10*3/uL — AB (ref 1.7–7.7)
Neutrophils Relative %: 81 %
Platelets: 223 10*3/uL (ref 150–400)
RBC: 4.06 MIL/uL — ABNORMAL LOW (ref 4.22–5.81)
RDW: 21.7 % — AB (ref 11.5–15.5)
WBC: 21.8 10*3/uL — AB (ref 4.0–10.5)

## 2017-09-19 LAB — LIPID PANEL
CHOL/HDL RATIO: 3.3 ratio
CHOLESTEROL: 79 mg/dL (ref 0–200)
HDL: 24 mg/dL — AB (ref >40)
LDL Cholesterol: 39 mg/dL (ref 0–99)
TRIGLYCERIDES: 79 mg/dL (ref <150)
VLDL: 16 mg/dL (ref 0–40)

## 2017-09-19 LAB — BASIC METABOLIC PANEL
ANION GAP: 8 (ref 5–15)
BUN: 34 mg/dL — ABNORMAL HIGH (ref 6–20)
CALCIUM: 8 mg/dL — AB (ref 8.9–10.3)
CHLORIDE: 109 mmol/L (ref 101–111)
CO2: 19 mmol/L — AB (ref 22–32)
Creatinine, Ser: 2.87 mg/dL — ABNORMAL HIGH (ref 0.61–1.24)
GFR, EST AFRICAN AMERICAN: 24 mL/min — AB (ref >60)
GFR, EST NON AFRICAN AMERICAN: 20 mL/min — AB (ref >60)
Glucose, Bld: 146 mg/dL — ABNORMAL HIGH (ref 65–99)
POTASSIUM: 3 mmol/L — AB (ref 3.5–5.1)
SODIUM: 136 mmol/L (ref 135–145)

## 2017-09-19 LAB — MAGNESIUM: Magnesium: 1.9 mg/dL (ref 1.7–2.4)

## 2017-09-19 LAB — PROTIME-INR
INR: 3.53
Prothrombin Time: 35.1 seconds — ABNORMAL HIGH (ref 11.4–15.2)

## 2017-09-19 LAB — PHOSPHORUS: Phosphorus: 3.4 mg/dL (ref 2.5–4.6)

## 2017-09-19 MED ORDER — METOPROLOL TARTRATE 50 MG PO TABS
50.0000 mg | ORAL_TABLET | Freq: Two times a day (BID) | ORAL | Status: DC
  Administered 2017-09-19 – 2017-09-22 (×6): 50 mg via ORAL
  Filled 2017-09-19 (×2): qty 1
  Filled 2017-09-19 (×3): qty 2
  Filled 2017-09-19: qty 1

## 2017-09-19 MED ORDER — POTASSIUM CHLORIDE 20 MEQ/15ML (10%) PO SOLN
40.0000 meq | ORAL | Status: AC
  Administered 2017-09-19 (×2): 40 meq via ORAL
  Filled 2017-09-19 (×2): qty 30

## 2017-09-19 MED ORDER — SODIUM CHLORIDE 0.9 % IV SOLN
1.0000 g | Freq: Two times a day (BID) | INTRAVENOUS | Status: DC
  Administered 2017-09-19 – 2017-09-20 (×2): 1 g via INTRAVENOUS
  Filled 2017-09-19 (×3): qty 1

## 2017-09-19 MED ORDER — METOPROLOL TARTRATE 25 MG PO TABS
25.0000 mg | ORAL_TABLET | Freq: Two times a day (BID) | ORAL | Status: DC
  Administered 2017-09-19: 25 mg via ORAL
  Filled 2017-09-19: qty 1

## 2017-09-19 MED ORDER — FUROSEMIDE 10 MG/ML IJ SOLN
60.0000 mg | Freq: Once | INTRAMUSCULAR | Status: AC
  Administered 2017-09-19: 60 mg via INTRAVENOUS
  Filled 2017-09-19: qty 6

## 2017-09-19 MED ORDER — FUROSEMIDE 10 MG/ML IJ SOLN
60.0000 mg | Freq: Two times a day (BID) | INTRAMUSCULAR | Status: DC
  Administered 2017-09-19 – 2017-09-23 (×8): 60 mg via INTRAVENOUS
  Filled 2017-09-19 (×8): qty 6

## 2017-09-19 NOTE — Progress Notes (Addendum)
Consult-SNF placement recommend.  CSW attempted to meet with patient, pt. resting and requested CSW come back at a different time.   CSW will return at different time.    Kathrin Greathouse, Latanya Presser, MSW Clinical Social Worker  [phone] 09/19/2017  1:20 PM

## 2017-09-19 NOTE — Progress Notes (Signed)
PROGRESS NOTE  [name]:[id number] DOB: [birth year] DOA: 09/14/2017 PCP: [name], MD   Brief summary:  H/o afib on coumadin, H/o colon cancer (II and IIIb) s/p colectomy under adjuvent chemotherapy cycle 6, sent from infusion center due to hypotension, leukocytosis, diminished oral intake for 2 weeks. He was admitted to hospitalist service, then  Transferred to icu due to severe sepsis/pna/hypotension, afib/rvr. He received vanc/zosyn initially, due to wrosening of renal function, abx changed to zyvox and imipenem , plan for total of 7 days, last dose on 11/19.  Improving, transfer back to hospitalist service on 11/14   HPI/Recap of past 24 hours:  Congested, cough, edematous, no fever, diarrhea has resolved, oral thrush much improved, heart rate better, he has set up in chair yesterday, he wants the foley out today  Assessment/Plan: Active Problems:   Sepsis due to pneumonia ([place])   Pressure injury of skin    Sepsis/septic shock/pna/acute hypoxia: -CT chest demonstrates primarily mucus plugging and pneumonia very small effusion confirming ultrasound findings on 11/12 -required fluids resuscitation and neo-synephrine from 11/11 to 11/13 -attempted US guided therapeutic and diagnostic thoracentesis on 11/12 aborted due to minimal fluids -blood culture negative, strep pneumo antigen and leginella antigen negative.  - remain coughing, reports breathing treatment helped, repeat ct chest, continue abx, speech eval   Afib/RVR -He was on cardizem drip, off heparin drip (was on heparin drip due to needing thoracentesis), restarted coumadin. -cardiology Dr Virgina Jock consulted.  -echo pending -improving, transition to oral med, restart home lopressor  Generalized edema:  -Likely fluids overload from resuscitation -echo pending -Minimize iv fluids -Consider lasix   AKI: From spesis? Renal US on 11/8 no obstruction Cr seems peaked at 3.65 D/c foley    Oral  candidiasis:  Oral candidiasis probably caused Sputum + candida  Topical nystatin, improving Denies dysphagia  HTN/HLD: Admitted with sepsis  discontinue cardizem drip on 11/15, restart lopressor Continue titrate meds   Colon ca (II and IIIb) : -s/p colectomy under adjuvent chemotherapy cycle 5 with Folfox on 10/24, planned cycle 6 Folfox cancelled due to  Hospitalization. -oncology aware of hospitalization  Decondition:  FTT, SNF recommended   Code Status: full  Family Communication: patient and significant other at bedside  Disposition Plan: remain in icu/stepdown today, likely able to transfer to med tele tomorrow   Consultants:  Critical care  Cardiology  oncology  Procedures: Aline placement on 11/11 20 gauge catheter was inserted into right radial artery using the Seldinger technique. Aline removed on 11/14        FFP on 11/12  Attempted thoracentesis on 11/12  Antibiotics:  Vanc/zosyn initially  Zyvox/meropenem from 11/12, plan to finish total of 7 days treatment   Objective: BP (!) 87/60   Pulse 100   Temp 98.1 F (36.7 C) (Axillary)   Resp (!) 27   Ht 6\' 2"  (1.88 m)   Wt 129.7 kg (285 lb 15 oz)   SpO2 96%   BMI 36.71 kg/m   Intake/Output Summary (Last 24 hours) at 09/19/2017 0749 Last data filed at 09/19/2017 0600 Gross per 24 hour  Intake 1776.84 ml  Output 500 ml  Net 1276.84 ml   Filed Weights   09/17/17 1200 09/18/17 0500 09/19/17 0500  Weight: 129.4 kg (285 lb 4.4 oz) 129.3 kg (285 lb 0.9 oz) 129.7 kg (285 lb 15 oz)    Exam: Patient is examined daily including today on 09/19/2017, exams remain the same as of yesterday except that has changed  General:  Frail, occasionally unproductive cough, oral thrush is improving,   Cardiovascular: IRRR,   Respiratory: diminished at basis, no wheezing, no rales, no rhonchi, chest port in place  Abdomen: Soft/ND/NT, positive BS, well healed surgical scar  Musculoskeletal: bilateral  lower extremity pitting Edema, right arm edema  Neuro: alert, oriented   Data Reviewed: Basic Metabolic Panel: Recent Labs  Lab 09/14/17 1628 09/15/17 0756 09/15/17 0951 09/16/17 0300 09/17/17 0500 09/17/17 1126 09/18/17 0313 09/19/17 0540  NA 136 139 140 141 143  --   --  136  K 3.5 3.5 3.7 3.1* 3.2*  --   --  3.0*  CL 113* 114* 116* 113* 117*  --   --  109  CO2 14* 16* 16* 18* 18*  --   --  19*  GLUCOSE 176* 136* 141* 135* 92  --   --  146*  BUN 16 25* 26* 31* 36*  --   --  34*  CREATININE 1.10 2.36* 2.53* 3.10* 3.65* 3.55* 3.32* 2.87*  CALCIUM 8.6* 8.2* 8.1* 7.6* 7.7*  --   --  8.0*  MG 1.7  --   --  1.6* 1.7  --  1.7 1.9  PHOS 2.9  --   --  3.5 3.8  --  3.7 3.4   Liver Function Tests: Recent Labs  Lab 09/15/17 0951 09/16/17 0300  AST 21 28  ALT 23 22  ALKPHOS 79 88  BILITOT 1.0 0.8  PROT 5.0* 4.9*  ALBUMIN 2.0* 1.9*   No results for input(s): LIPASE, AMYLASE in the last 168 hours. No results for input(s): AMMONIA in the last 168 hours. CBC: Recent Labs  Lab 09/15/17 0951 09/16/17 0300 09/17/17 0500 09/18/17 0313 09/19/17 0540  WBC 31.6* 42.4* 23.1* 23.0* 21.8*  NEUTROABS 27.1* 36.9* 19.6* 19.0* 17.7*  HGB 10.8* 10.3* 9.0* 9.7* 10.2*  HCT 34.4* 32.1* 27.9* 30.0* 31.9*  MCV 79.3 77.7* 77.9* 78.3 78.6  PLT 263 330 231 211 223   Cardiac Enzymes:   Recent Labs  Lab 09/14/17 1628 09/15/17 0951 09/16/17 0300  TROPONINI <0.03 0.04* 0.06*   BNP (last 3 results) Recent Labs    09/14/17 1628  BNP 505.8*    ProBNP (last 3 results) No results for input(s): PROBNP in the last 8760 hours.  CBG: No results for input(s): GLUCAP in the last 168 hours.  Recent Results (from the past 240 hour(s))  Culture, blood (routine x 2)     Status: None   Collection Time: 09/11/17  6:43 PM  Result Value Ref Range Status   Specimen Description BLOOD LEFT ANTECUBITAL  Final   Special Requests   Final    BOTTLES DRAWN AEROBIC ONLY Blood Culture adequate volume    Culture   Final    NO GROWTH 5 DAYS Performed at [facility], [address], [place], [place] [address]    Report Status 09/16/2017 FINAL  Final  Culture, blood (routine x 2)     Status: None   Collection Time: 09/11/17  6:55 PM  Result Value Ref Range Status   Specimen Description BLOOD RIGHT ANTECUBITAL  Final   Special Requests IN PEDIATRIC BOTTLE Blood Culture adequate volume  Final   Culture   Final    NO GROWTH 5 DAYS Performed at [facility], [place] [address] [place],  [address]    Report Status 09/16/2017 FINAL  Final  Culture, expectorated sputum-assessment     Status: None   Collection Time: 09/14/17 10:34  PM  Result Value Ref Range Status   Specimen Description EXPECTORATED SPUTUM  Final   Special Requests Immunocompromised  Final   Sputum evaluation THIS SPECIMEN IS ACCEPTABLE FOR SPUTUM CULTURE  Final   Report Status 09/14/2017 FINAL  Final  Culture, respiratory (NON-Expectorated)     Status: None   Collection Time: 09/14/17 10:34 PM  Result Value Ref Range Status   Specimen Description EXPECTORATED SPUTUM  Final   Special Requests Immunocompromised Reflexed from [id number]  Final   Gram Stain   Final    ABUNDANT WBC PRESENT, PREDOMINANTLY PMN FEW SQUAMOUS EPITHELIAL CELLS PRESENT MODERATE YEAST WITH PSEUDOHYPHAE MODERATE GRAM POSITIVE RODS RARE GRAM NEGATIVE RODS Performed at [facility], [place] [address], [place], [place] [address]    Culture ABUNDANT CANDIDA ALBICANS  Final   Report Status 09/18/2017 FINAL  Final  Culture, blood (x 2)     Status: None (Preliminary result)   Collection Time: 09/15/17  9:48 AM  Result Value Ref Range Status   Specimen Description BLOOD RIGHT ANTECUBITAL  Final   Special Requests IN PEDIATRIC BOTTLE Blood Culture adequate volume  Final   Culture   Final    NO GROWTH 3 DAYS Performed at [facility], [place] [address], [place], [place] [address]    Report Status PENDING  Incomplete  Culture, blood (x  2)     Status: None (Preliminary result)   Collection Time: 09/15/17  9:48 AM  Result Value Ref Range Status   Specimen Description BLOOD BLOOD RIGHT HAND  Final   Special Requests IN PEDIATRIC BOTTLE Blood Culture adequate volume  Final   Culture   Final    NO GROWTH 3 DAYS Performed at [facility], [place] [address], [place], [place] [address]    Report Status PENDING  Incomplete  C difficile quick scan w PCR reflex     Status: None   Collection Time: 09/15/17 11:42 AM  Result Value Ref Range Status   C Diff antigen NEGATIVE NEGATIVE Final   C Diff toxin NEGATIVE NEGATIVE Final   C Diff interpretation NEGATIVE  Final  Gastrointestinal Panel by PCR , Stool     Status: None   Collection Time: 09/15/17 11:42 AM  Result Value Ref Range Status   Campylobacter species NOT DETECTED NOT DETECTED Final   Plesimonas shigelloides NOT DETECTED NOT DETECTED Final   Salmonella species NOT DETECTED NOT DETECTED Final   Yersinia enterocolitica NOT DETECTED NOT DETECTED Final   Vibrio species NOT DETECTED NOT DETECTED Final   Vibrio cholerae NOT DETECTED NOT DETECTED Final   Enteroaggregative E coli (EAEC) NOT DETECTED NOT DETECTED Final   Enteropathogenic E coli (EPEC) NOT DETECTED NOT DETECTED Final   Enterotoxigenic E coli (ETEC) NOT DETECTED NOT DETECTED Final   Shiga like toxin producing E coli (STEC) NOT DETECTED NOT DETECTED Final   Shigella/Enteroinvasive E coli (EIEC) NOT DETECTED NOT DETECTED Final   Cryptosporidium NOT DETECTED NOT DETECTED Final   Cyclospora cayetanensis NOT DETECTED NOT DETECTED Final   Entamoeba histolytica NOT DETECTED NOT DETECTED Final   Giardia lamblia NOT DETECTED NOT DETECTED Final   Adenovirus F40/41 NOT DETECTED NOT DETECTED Final   Astrovirus NOT DETECTED NOT DETECTED Final   Norovirus GI/GII NOT DETECTED NOT DETECTED Final   Rotavirus A NOT DETECTED NOT DETECTED Final   Sapovirus (I, II, IV, and V) NOT DETECTED NOT DETECTED Final  Respiratory Panel  by PCR     Status: None  Collection Time: 09/15/17  3:25 PM  Result Value Ref Range Status   Adenovirus NOT DETECTED NOT DETECTED Final   Coronavirus 229E NOT DETECTED NOT DETECTED Final   Coronavirus HKU1 NOT DETECTED NOT DETECTED Final   Coronavirus NL63 NOT DETECTED NOT DETECTED Final   Coronavirus OC43 NOT DETECTED NOT DETECTED Final   Metapneumovirus NOT DETECTED NOT DETECTED Final   Rhinovirus / Enterovirus NOT DETECTED NOT DETECTED Final   Influenza A NOT DETECTED NOT DETECTED Final   Influenza B NOT DETECTED NOT DETECTED Final   Parainfluenza Virus 1 NOT DETECTED NOT DETECTED Final   Parainfluenza Virus 2 NOT DETECTED NOT DETECTED Final   Parainfluenza Virus 3 NOT DETECTED NOT DETECTED Final   Parainfluenza Virus 4 NOT DETECTED NOT DETECTED Final   Respiratory Syncytial Virus NOT DETECTED NOT DETECTED Final   Bordetella pertussis NOT DETECTED NOT DETECTED Final   Chlamydophila pneumoniae NOT DETECTED NOT DETECTED Final   Mycoplasma pneumoniae NOT DETECTED NOT DETECTED Final    Comment: Performed at [facility], [place] [address] [place], [place] [address]  MRSA PCR Screening     Status: None   Collection Time: 09/15/17  3:25 PM  Result Value Ref Range Status   MRSA by PCR NEGATIVE NEGATIVE Final    Comment:        The GeneXpert MRSA Assay (FDA approved for NASAL specimens only), is one component of a comprehensive MRSA colonization surveillance program. It is not intended to diagnose MRSA infection nor to guide or monitor treatment for MRSA infections.      Studies: No results found.  Scheduled Meds: . chlorhexidine  15 mL Mouth Rinse BID  . Chlorhexidine Gluconate Cloth  6 each Topical Daily  . dorzolamide  1 drop Both Eyes BID   And  . timolol  1 drop Both Eyes BID  . famotidine  20 mg Oral QHS  . feeding supplement (ENSURE ENLIVE)  237 mL Oral BID BM  . guaiFENesin  600 mg Oral BID  . ipratropium  0.5 mg Nebulization TID  . levalbuterol  1.25 mg  Nebulization TID  . mouth rinse  15 mL Mouth Rinse q12n4p  . multivitamin with minerals  1 tablet Oral Daily  . nystatin  5 mL Oral QID  . potassium chloride  40 mEq Oral Q4H  . sodium bicarbonate  650 mg Oral BID  . Warfarin - Pharmacist Dosing Inpatient   Does not apply q1800    Continuous Infusions: . diltiazem (CARDIZEM) infusion 10 mg/hr (09/19/17 0600)  . linezolid (ZYVOX) IV Stopped (09/18/17 2333)  . meropenem (MERREM) IV Stopped (09/19/17 6834)     Time spent: 43mins I have personally reviewed and interpreted on  09/19/2017 daily labs, tele strips, imagings as discussed above under date review session and assessment and plans.  I reviewed all nursing notes, pharmacy notes, consultant notes,  vitals, pertinent old records  I have discussed plan of care as described above with RN , patient and family on 09/19/2017   Kyvon Hu MD, PhD  [facility] Pager [phone]. If 7PM-7AM, please contact night-coverage at [email or web address], password [facility] 09/19/2017, 7:49 AM  LOS: 5 days

## 2017-09-19 NOTE — Progress Notes (Signed)
PT Cancellation Note  Patient Details Name: [name] MRN: [id number] DOB: [birth year]   Cancelled Treatment:    Reason Eval/Treat Not Completed: Fatigue/lethargy limiting ability to participate. Patient had been to [facility] then back into bed and asleep. RN recommends to allow rest.    Claretha Cooper 09/19/2017, 4:05 PM Tresa Endo PT [phone]

## 2017-09-19 NOTE — Progress Notes (Signed)
Echo- 05/22/2017 1. Left ventricle cavity is normal in size. Mild concentric hypertrophy of the left ventricle. Normal global wall motion. Diastolic function could not be assessed properly due to A.Fib. Calculated EF 66%. 2. Left atrial cavity is moderately dilated. 3. Right atrial cavity is mild to moderately dilated. 4. Right ventricle cavity is slightly dilated. Normal right ventricular function. 5. Mild (Grade I) aortic regurgitation. 6. Mild (Grade I) mitral regurgitation. Mild calcification of the mitral valve annulus. 7. Moderate tricuspid regurgitation. Mild pulmonary hypertension with approx. PA syst. pressure of 39 mm of Hg.

## 2017-09-19 NOTE — Progress Notes (Signed)
Date: September 19, 2017 Velva Harman, BSN, [place], [place] Chart and notes review for patient progress and needs. Will follow for case management and discharge needs. Next review date: [id number]

## 2017-09-19 NOTE — Progress Notes (Signed)
Subjective:  Feels much better Denies chest pain, shortness of breath Has leg edema   Objective:  Vital Signs in the last 24 hours: Temp:  [97.3 F (36.3 C)-98.5 F (36.9 C)] 97.9 F (36.6 C) (11/15 1600) Pulse Rate:  [82-117] 117 (11/15 1800) Resp:  [20-30] 30 (11/15 1800) BP: (87-140)/(36-85) 138/85 (11/15 1800) SpO2:  [91 %-98 %] 94 % (11/15 1800) Weight:  [129.7 kg (285 lb 15 oz)] 129.7 kg (285 lb 15 oz) (11/15 0500)  Intake/Output from previous day: 11/14 0701 - 11/15 0700 In: 1786.8 [P.O.:175; I.V.:411.8; IV Piggyback:700] Out: 500 [Urine:500] Intake/Output from this shift: Total I/O In: 741.2 [I.V.:41.2; IV Piggyback:700] Out: 910 [Urine:910]  +11L for hospital stay  Physical Exam:  Nursing note and vitals reviewed. Constitutional: He is oriented to person, place, and time. He appears well-developed. He appears in no distress  HENT:  Head: Normocephalic and atraumatic.  Eyes: Conjunctivae are normal. Pupils are equal, round, and reactive to light.  Neck: No JVD present.  Cardiovascular: Normal rate.  No murmur heard. Afib w/RVR Respiratory: He is in no respiratory distress. He has no rales GI: Soft. Bowel sounds are normal. He exhibits no distension.  Musculoskeletal: He exhibits edema 2+ b/l.  Neurological: He is alert and oriented to person, place, and time.  Skin: Skin is warm and dry.      Lab Results: Recent Labs    09/18/17 0313 09/19/17 0540  WBC 23.0* 21.8*  HGB 9.7* 10.2*  PLT 211 223   Recent Labs    09/17/17 0500  09/18/17 0313 09/19/17 0540  NA 143  --   --  136  K 3.2*  --   --  3.0*  CL 117*  --   --  109  CO2 18*  --   --  19*  GLUCOSE 92  --   --  146*  BUN 36*  --   --  34*  CREATININE 3.65*   < > 3.32* 2.87*   < > = values in this interval not displayed.    Recent Labs    09/19/17 0540  CHOL 79   Imaging: CLINICAL DATA:  71 y/o  M; respiratory distress.  EXAM: PORTABLE CHEST 1 VIEW  COMPARISON:  09/15/2017  chest radiograph.  FINDINGS: Large increased left pleural effusion and worsening atelectasis of left lung. Clear right lung. Cardiac silhouette obscured by left diffusion. Left central venous catheter tip projects over mid S be seen. No acute osseous abnormality is evident.  IMPRESSION: Increased large left pleural effusion and worsening atelectasis of left lung. Clear right lung.   Cardiac Studies: Echo- 05/22/2017 1. Left ventricle cavity is normal in size. Mild concentric hypertrophy of the left ventricle. Normal global wall motion. Diastolic function could not be assessed properly due to A.Fib. Calculated EF 66%. 2. Left atrial cavity is moderately dilated. 3. Right atrial cavity is mild to moderately dilated. 4. Right ventricle cavity is slightly dilated. Normal right ventricular function. 5. Mild (Grade I) aortic regurgitation. 6. Mild (Grade I) mitral regurgitation. Mild calcification of the mitral valve annulus. 7. Moderate tricuspid regurgitation. Mild pulmonary hypertension with approx. PA syst. pressure of 39 mm of Hg.    Assessment: Atrial fibrillation CHA2DS2VASc score 2, moderate stroke risk. On warfarin at home.  RVR driven by infection, as well as volume overload Severe sepsis w/pneumonia. Now resolving on antibiotics Volume overload AKI Hypertension Hyperlipidemia History of colon cancer on chemotherapy  Recommendations: Agree with PO metoprolol 25 mg bid. Can uptitrate  as necessary up to 100 mg/day. I would give time for the metoprolol to act before adding another agent. Next step would be to add PO diltiazem up to 360 mg/day. I would not add amiodarone at this time. Continue heparin/warfarin as you are doing. Also, continue with diuresis. Diuresis will also help improve his Afib rate. I do not think repeat echocardiogram will change management.  Rest of the management per the primary team.    LOS: 5 days    Aaryav Hopfensperger J Tevyn Codd 09/19/2017, 6:16  PM

## 2017-09-19 NOTE — Progress Notes (Signed)
OT Cancellation Note  Patient Details Name: [name] MRN: [id number] DOB: 12-08-1945   Cancelled Treatment:    Reason Eval/Treat Not Completed: Fatigue/lethargy limiting ability to participate.  RN reports that pt did not sleep well last night. He was sleeping soundly with HR in the 90s - 107. Will check back tomorrow  Marvin Grabill 09/19/2017, 2:56 PM  Lesle Chris, OTR/L [phone] 09/19/2017

## 2017-09-19 NOTE — Progress Notes (Signed)
[place] for warfarin Indication: atrial fibrillation  No Known Allergies  Patient Measurements: Height: 6\' 2"  (188 cm) Weight: 285 lb 15 oz (129.7 kg) IBW/kg (Calculated) : 82.2 HEPARIN DW (KG): 106.9  Vital Signs: Temp: 97.6 F (36.4 C) (11/15 0800) Temp Source: Oral (11/15 0800) BP: 115/36 (11/15 0910) Pulse Rate: 105 (11/15 0910)  Labs: Recent Labs    09/17/17 0030  09/17/17 0500 09/17/17 1126 09/18/17 0313 09/19/17 0540  HGB  --    < > 9.0*  --  9.7* 10.2*  HCT  --   --  27.9*  --  30.0* 31.9*  PLT  --   --  231  --  211 223  LABPROT 30.1*  --   --   --  31.5* 35.1*  INR 2.90  --   --   --  3.07 3.53  HEPARINUNFRC 0.22*  --   --   --   --   --   CREATININE  --    < > 3.65* 3.55* 3.32* 2.87*   < > = values in this interval not displayed.   Estimated Creatinine Clearance: 32.8 mL/min (A) (by C-G formula based on SCr of 2.87 mg/dL (H)).  Medications:   Scheduled:  . chlorhexidine  15 mL Mouth Rinse BID  . Chlorhexidine Gluconate Cloth  6 each Topical Daily  . dorzolamide  1 drop Both Eyes BID   And  . timolol  1 drop Both Eyes BID  . famotidine  20 mg Oral QHS  . feeding supplement (ENSURE ENLIVE)  237 mL Oral BID BM  . guaiFENesin  600 mg Oral BID  . ipratropium  0.5 mg Nebulization TID  . levalbuterol  1.25 mg Nebulization TID  . mouth rinse  15 mL Mouth Rinse q12n4p  . metoprolol tartrate  25 mg Oral BID  . multivitamin with minerals  1 tablet Oral Daily  . nystatin  5 mL Oral QID  . potassium chloride  40 mEq Oral Q4H  . sodium bicarbonate  650 mg Oral BID  . Warfarin - Pharmacist Dosing Inpatient   Does not apply q1800   Assessment: 59 yoM readmitted 11/10 pm for worsening SOB, non-productive cough, LE edema. Prev admit 11/7-9 for hypoNa/AKI. Treating for HCAP and Afib.  Hx: Colon Ca, HTN, HPL Afib on Warfarin, LD 11/9, home dose 2mg  daily, was d/c on 1mg  daily  Today, 09/19/2017 - INR supratherapeutic -  warfarin restarted 11/13 with low dose of 0.5mg  given at 1800, dose held yesterday - Hgb low, stable. Platelets WNL. - No bleeding/complications reported.  - On full liquid diet. Diarrhea appears to have subsided per flowsheet data.  Goal of Therapy:  INR 2-3  Plan:  1) No warfarin today 2) Daily INR  Hershal Coria, PharmD, BCPS Pager: [phone] 09/19/2017 9:39 AM

## 2017-09-19 NOTE — Progress Notes (Signed)
Pharmacy Antibiotic Note  [name] is a 71 y.o. male admitted on 09/14/2017 with pneumonia and sepsis.  Patient initially started on Vancomycin and Zosyn on 11/10. Switched to Zyvox and meropenem on 11/12 due to developing renal failure and continued worsening of WBC.  Day #6 total antibiotics.  Patient remains afebrile and WBC showing improvement.  Plan: 1) Adjust meropenem to 1g IV q12h for improving SCr 2) Zyvox per CCM 3) Continue to monitor SCr closely 4) Consider discontinuing antibiotics after tomorrow's doses to complete 7 days of treatment.  Height: 6\' 2"  (188 cm) Weight: 285 lb 15 oz (129.7 kg) IBW/kg (Calculated) : 82.2  Temp (24hrs), Avg:97.8 F (36.6 C), Min:97.3 F (36.3 C), Max:98.5 F (36.9 C)  Recent Labs  Lab 09/14/17 1746  09/15/17 0951 09/15/17 1652 09/16/17 0300 09/17/17 0500 09/17/17 1126 09/18/17 0313 09/19/17 0540  WBC  --    < > 31.6*  --  42.4* 23.1*  --  23.0* 21.8*  CREATININE  --    < > 2.53*  --  3.10* 3.65* 3.55* 3.32* 2.87*  LATICACIDVEN 1.4  --  1.3 1.2  --   --   --   --   --   VANCORANDOM  --   --   --   --  15  --   --   --   --    < > = values in this interval not displayed.    Estimated Creatinine Clearance: 32.8 mL/min (A) (by C-G formula based on SCr of 2.87 mg/dL (H)).    No Known Allergies  Antimicrobials this admission:  Vancomycin 09/14/2017 >> 11/12 Zosyn 09/14/2017 >> 11/12 11/12 meropenem >> 11/12 Zyvox per CCM >>  Dose adjustments this admission:  1250mg  q12hr (457, 1.1 96.1kg ABW) as initial dose, reduce to 1gm q24 11/12 AM VR = 15 about 33hrs after 2g LD, redose with vanc 1250mg  x1 11/13: SCr continues to rise - change meropenem from 1g q12 to 500mg  q12 11/14 SCr improving, adjust meropenem to 1g q12h  Microbiology results:  Previous admit: 11/7 BCx: NGF  11/10 Sputum cx: Candida albicans 11/11 BCx: ngtd 11/11 Cdiff: neg 11/11 MRSA PCR: neg 11/11 resp panel: neg  Thank you for allowing pharmacy to  be a part of this patient's care.  Hershal Coria, PharmD, BCPS Pager: [phone] 09/19/2017 9:34 AM

## 2017-09-19 NOTE — Progress Notes (Signed)
  Speech Language Pathology Treatment: Dysphagia  Patient Details Name: [name] MRN: [id number] DOB: Oct 10, 1946 Today's Date: 09/19/2017 Time: 1583-0940 SLP Time Calculation (min) (ACUTE ONLY): 47 min  Assessment / Plan / Recommendation Clinical Impression  Pt presents with much improved mentation, respiratory status today per his significant other.  SLP noted patient to have dried yellow=tinged oral sections without adequate awareness.   SLP assisted him to brush his teeth = and educated him and significant other to aspiration risk.  Observed pt with consumption of water, Ensure, applesauce and graham cracker.  3 ounce water test passed easily.    Recommend advance pt to regular/thin diet with general aspiration precautions.    No SLP follow up indicated as all education completed.      HPI HPI: Pt is a 71 year old male undergoing chemotherapy for colon cancer admitted with septic shock secondary to PNA. PMH also includes HTN, HLD, dysrhythmia, anxiety.  Of note, pt reports chronic problems with thrush over 30 years ? source?       SLP Plan  All goals met       Recommendations  Medication Administration: Whole meds with liquid Supervision: Patient able to self feed Compensations: Slow rate;Small sips/bites(start meals with liquids) Postural Changes and/or Swallow Maneuvers: Seated upright 90 degrees;Upright 30-60 min after meal                Oral Care Recommendations: Oral care BID Follow up Recommendations: (tba) SLP Visit Diagnosis: Dysphagia, unspecified (R13.10) Plan: All goals met       GO                Claudie Fisherman, [facility] SLP [phone]

## 2017-09-19 NOTE — Progress Notes (Signed)
Attending physician notified of pts fluid status and diminished urine production. recommended considering lasix.

## 2017-09-20 ENCOUNTER — Other Ambulatory Visit: Payer: Self-pay | Admitting: Nurse Practitioner

## 2017-09-20 ENCOUNTER — Telehealth: Payer: Self-pay | Admitting: Nurse Practitioner

## 2017-09-20 DIAGNOSIS — C189 Malignant neoplasm of colon, unspecified: Secondary | ICD-10-CM

## 2017-09-20 LAB — BASIC METABOLIC PANEL WITH GFR
Anion gap: 8 (ref 5–15)
BUN: 34 mg/dL — ABNORMAL HIGH (ref 6–20)
CO2: 23 mmol/L (ref 22–32)
Calcium: 8.3 mg/dL — ABNORMAL LOW (ref 8.9–10.3)
Chloride: 112 mmol/L — ABNORMAL HIGH (ref 101–111)
Creatinine, Ser: 2.57 mg/dL — ABNORMAL HIGH (ref 0.61–1.24)
GFR calc Af Amer: 27 mL/min — ABNORMAL LOW
GFR calc non Af Amer: 23 mL/min — ABNORMAL LOW
Glucose, Bld: 114 mg/dL — ABNORMAL HIGH (ref 65–99)
Potassium: 3.1 mmol/L — ABNORMAL LOW (ref 3.5–5.1)
Sodium: 143 mmol/L (ref 135–145)

## 2017-09-20 LAB — CULTURE, BLOOD (ROUTINE X 2)
Culture: NO GROWTH
Culture: NO GROWTH
SPECIAL REQUESTS: ADEQUATE
Special Requests: ADEQUATE

## 2017-09-20 LAB — CBC
HCT: 31.7 % — ABNORMAL LOW (ref 39.0–52.0)
Hemoglobin: 10.1 g/dL — ABNORMAL LOW (ref 13.0–17.0)
MCH: 24.9 pg — ABNORMAL LOW (ref 26.0–34.0)
MCHC: 31.9 g/dL (ref 30.0–36.0)
MCV: 78.1 fL (ref 78.0–100.0)
Platelets: 210 K/uL (ref 150–400)
RBC: 4.06 MIL/uL — ABNORMAL LOW (ref 4.22–5.81)
RDW: 21.7 % — ABNORMAL HIGH (ref 11.5–15.5)
WBC: 17.6 K/uL — ABNORMAL HIGH (ref 4.0–10.5)

## 2017-09-20 LAB — GLUCOSE, CAPILLARY: Glucose-Capillary: 157 mg/dL — ABNORMAL HIGH (ref 65–99)

## 2017-09-20 LAB — TSH: TSH: 1.727 u[IU]/mL (ref 0.350–4.500)

## 2017-09-20 LAB — PROTIME-INR
INR: 3.81
PROTHROMBIN TIME: 37.2 s — AB (ref 11.4–15.2)

## 2017-09-20 LAB — MAGNESIUM: Magnesium: 1.8 mg/dL (ref 1.7–2.4)

## 2017-09-20 MED ORDER — TAMSULOSIN HCL 0.4 MG PO CAPS
0.4000 mg | ORAL_CAPSULE | Freq: Every day | ORAL | Status: DC
  Administered 2017-09-20 – 2017-09-24 (×5): 0.4 mg via ORAL
  Filled 2017-09-20 (×5): qty 1

## 2017-09-20 MED ORDER — POTASSIUM CHLORIDE 20 MEQ/15ML (10%) PO SOLN
40.0000 meq | ORAL | Status: AC
  Administered 2017-09-20 (×2): 40 meq via ORAL
  Filled 2017-09-20 (×2): qty 30

## 2017-09-20 MED ORDER — AMOXICILLIN-POT CLAVULANATE 500-125 MG PO TABS
1.0000 | ORAL_TABLET | Freq: Two times a day (BID) | ORAL | Status: AC
  Administered 2017-09-20 – 2017-09-22 (×6): 500 mg via ORAL
  Filled 2017-09-20 (×6): qty 1

## 2017-09-20 NOTE — Telephone Encounter (Signed)
Scheduled appt per 11/16 sch msg - patient's mailbox is full. Sending confirmation letter.

## 2017-09-20 NOTE — Progress Notes (Signed)
OT Note:  Gave pt's significant other yellow band with instruction for 2 exercises:  Horizontal abduction and FF to 90 degrees.  Will update program and provide written program on next visit.  [place], OTR/L [phone] 09/20/2017

## 2017-09-20 NOTE — Progress Notes (Signed)
Physical Therapy Treatment Patient Details Name: [name] MRN: [id number] DOB: Nov 09, 1945 Today's Date: 09/20/2017    History of Present Illness 71 yo male admitted with ARF, hypokalemia, supratherapeutic INR. Hx of A fib, colon ca-on chemo, HTN. Septsis and transfer to ICU.    PT Comments    Performed stand pivot transfer x 2 with min assist, SaO2 89% on 3L O2, HR 122 max.  ST-SNF recommended.    Follow Up Recommendations  SNF     Equipment Recommendations  None recommended by PT    Recommendations for Other Services       Precautions / Restrictions Precautions Precautions: Fall Precaution Comments: monitor BP, O2 Restrictions Weight Bearing Restrictions: No    Mobility  Bed Mobility Overal bed mobility: Needs Assistance Bed Mobility: Supine to Sit     Supine to sit: Mod assist     General bed mobility comments: assist with trunk and to pivot hips to EOB  Transfers Overall transfer level: Needs assistance Equipment used: Rolling walker (2 wheeled) Transfers: Sit to/from [facility] Sit to Stand: Mod assist;+2 physical assistance;+2 safety/equipment Stand pivot transfers: Mod assist;+2 physical assistance;+2 safety/equipment       General transfer comment: close guard for safety. shuffle steps. Pivot to [facility] then recliner. using RW. VCs hand placement. SaO2 89% on 3L O2 with activity, HR 122 max with activity, 2/4 dyspnea, VCs for pursed lip breathing  Ambulation/Gait                 Stairs            Wheelchair Mobility    Modified Rankin (Stroke Patients Only)       Balance Overall balance assessment: Needs assistance Sitting-balance support: Feet supported Sitting balance-Leahy Scale: Fair     Standing balance support: Bilateral upper extremity supported Standing balance-Leahy Scale: Poor Standing balance comment: requring RW currently, mildly unsteady initially upon standing                             Cognition Arousal/Alertness: Awake/alert Behavior During Therapy: WFL for tasks assessed/performed Overall Cognitive Status: Within Functional Limits for tasks assessed                                        Exercises  ankle pumps x 10 AROM bilateral seated    General Comments        Pertinent Vitals/Pain Pain Assessment: No/denies pain    Home Living                      Prior Function            PT Goals (current goals can now be found in the care plan section) Acute Rehab PT Goals Patient Stated Goal: to go home PT Goal Formulation: With patient/family Time For Goal Achievement: 10/02/17 Potential to Achieve Goals: Good Progress towards PT goals: Progressing toward goals    Frequency    Min 3X/week      PT Plan Current plan remains appropriate    Co-evaluation PT/OT/SLP Co-Evaluation/Treatment: Yes Reason for Co-Treatment: For patient/therapist safety PT goals addressed during session: Mobility/safety with mobility;Balance;Proper use of DME        AM-PAC PT "6 Clicks" Daily Activity  Outcome Measure  Difficulty turning over in bed (including adjusting bedclothes, sheets and blankets)?: Unable Difficulty  moving from lying on back to sitting on the side of the bed? : Unable Difficulty sitting down on and standing up from a chair with arms (e.g., wheelchair, bedside commode, etc,.)?: Unable Help needed moving to and from a bed to chair (including a wheelchair)?: A Little Help needed walking in hospital room?: Total Help needed climbing 3-5 steps with a railing? : Total 6 Click Score: 8    End of Session Equipment Utilized During Treatment: Gait belt;Oxygen Activity Tolerance: Patient tolerated treatment well Patient left: in chair;with call bell/phone within reach;with family/visitor present;with chair alarm set Nurse Communication: Mobility status PT Visit Diagnosis: Muscle weakness (generalized) (M62.81);Difficulty  in walking, not elsewhere classified (R26.2)     Time: 9201-0071 PT Time Calculation (min) (ACUTE ONLY): 22 min  Charges:  $Therapeutic Activity: 8-22 mins                    G Codes:          Philomena Doheny 09/20/2017, 12:18 PM [phone]

## 2017-09-20 NOTE — Progress Notes (Signed)
PROGRESS NOTE  [name] ASN:[id number] DOB: June 30, 1946 DOA: 09/14/2017 PCP: [name], MD   Brief summary:  H/o afib on coumadin, H/o colon cancer (II and IIIb) s/p colectomy under adjuvent chemotherapy cycle 6, sent from infusion center due to hypotension, leukocytosis, diminished oral intake for 2 weeks. He was admitted to hospitalist service, then  Transferred to icu due to severe sepsis/pna/hypotension, afib/rvr. He received vanc/zosyn initially, due to wrosening of renal function, abx changed to zyvox and imipenem , plan for total of 7 days, last dose on 11/19.  Improving, transfer back to hospitalist service on 11/14   HPI/Recap of past 24 hours:  3liter urine output last 24hrs, had to place foley due to urinary retention last night Less cough, less edematous, heart rate better, no fever, leukocytosis improving diarrhea has resolved, rectal tube removed. oral thrush much improved,  he has set up in chair this morning and tolerated regular diet Significant other at bedside  Assessment/Plan: Active Problems:   Sepsis due to pneumonia ([place])   Pressure injury of skin    Sepsis/septic shock/pna/acute hypoxia: -CT chest demonstrates primarily mucus plugging and pneumonia , blood culture negative, strep pneumo antigen and leginella antigen negative.  -he required fluids resuscitation and neo-synephrine from 11/11 to 11/13,  -attempted US guided therapeutic and diagnostic thoracentesis on 11/12 aborted due to minimal fluids -- repeat ct chest on 11/15 due to persistent cough,  "1. Interval clearing of material within left mainstem bronchus. 2. Persistent left lower lobe pneumonia. 3. Mild right lower lobe atelectasis versus early lobar pneumonia. 4. Increased bilateral pleural effusions, left greater than right. 5. Loculated left pleural effusion along the major fissure."  -he finished total of 7 days to zyvox and meropenem, last dose on 11/16, abx change to oral  augmentin for another three days.  -he did well with speech eval, now on regular diet. Continue diuresis, wean oxygen as tolerated, transfer to med tele on 11/16.  Generalized edema/pleural effusion:  -Likely fluids overload from resuscitation -Consider lasix  Afib/RVR -He was on cardizem drip, off heparin drip (was on heparin drip due to needing thoracentesis), restarted coumadin. -cardiology Dr Virgina Jock consulted. Detail please refer to consult note on 11/15. -improving, transition to oral med, titrate lopressor   AKI: From spesis? Renal US on 11/8 no obstruction Cr seems peaked at 3.65, cr improving, renal dosing meds  Acute urinary retention: has to reinsert foley on 11/16 Check psa, start flomax.   Oral candidiasis:  Oral candidiasis probably caused Sputum + candida  Topical nystatin, improving Denies dysphagia  HTN/HLD: Admitted with sepsis  discontinue cardizem drip on 11/15, restart lopressor Continue titrate meds   Colon ca (II and IIIb) : -s/p colectomy under adjuvent chemotherapy cycle 5 with Folfox on 10/24, planned cycle 6 Folfox cancelled due to  Hospitalization. -oncology aware of hospitalization  Decondition:  FTT, SNF recommended   Code Status: full  Family Communication: patient and significant other at bedside  Disposition Plan:  transfer to med tele , continue diruesis   Consultants:  Critical care  Cardiology  oncology  Procedures: Aline placement on 11/11, 20 gauge catheter was inserted into right radial artery using the Seldinger technique. Aline removed on 11/14   FFP  Transfusion on 11/12 Attempted thoracentesis on 11/12  Antibiotics:  Vanc/zosyn initially  Zyvox/meropenem from 11/12, last dose 11/16  augmentin from 11/16 , plan to total three days   Objective: BP 115/67   Pulse 98   Temp 97.7 F (36.5 C)  Resp (!) 22   Ht 6\' 2"  (1.88 m)   Wt 127.5 kg (281 lb 1.4 oz)   SpO2 93%   BMI 36.09 kg/m    Intake/Output Summary (Last 24 hours) at 09/20/2017 4431 Last data filed at 09/20/2017 0600 Gross per 24 hour  Intake 731.17 ml  Output 3210 ml  Net -2478.83 ml   Filed Weights   09/18/17 0500 09/19/17 0500 09/20/17 0358  Weight: 129.3 kg (285 lb 0.9 oz) 129.7 kg (285 lb 15 oz) 127.5 kg (281 lb 1.4 oz)    Exam: Patient is examined daily including today on 09/20/2017, exams remain the same as of yesterday except that has changed    General:  Frail, no  Cough noted during encounter today, oral thrush is improving,   Cardiovascular: IRRR,   Respiratory: diminished at basis, no wheezing, no rales, no rhonchi, chest port in place  Abdomen: Soft/ND/NT, positive BS, well healed surgical scar  Musculoskeletal: bilateral lower extremity pitting Edema, right arm edema has much improved  Neuro: alert, oriented   Data Reviewed: Basic Metabolic Panel: Recent Labs  Lab 09/14/17 1628  09/15/17 0951 09/16/17 0300 09/17/17 0500 09/17/17 1126 09/18/17 0313 09/19/17 0540 09/20/17 0426  NA 136   < > 140 141 143  --   --  136 143  K 3.5   < > 3.7 3.1* 3.2*  --   --  3.0* 3.1*  CL 113*   < > 116* 113* 117*  --   --  109 112*  CO2 14*   < > 16* 18* 18*  --   --  19* 23  GLUCOSE 176*   < > 141* 135* 92  --   --  146* 114*  BUN 16   < > 26* 31* 36*  --   --  34* 34*  CREATININE 1.10   < > 2.53* 3.10* 3.65* 3.55* 3.32* 2.87* 2.57*  CALCIUM 8.6*   < > 8.1* 7.6* 7.7*  --   --  8.0* 8.3*  MG 1.7  --   --  1.6* 1.7  --  1.7 1.9 1.8  PHOS 2.9  --   --  3.5 3.8  --  3.7 3.4  --    < > = values in this interval not displayed.   Liver Function Tests: Recent Labs  Lab 09/15/17 0951 09/16/17 0300  AST 21 28  ALT 23 22  ALKPHOS 79 88  BILITOT 1.0 0.8  PROT 5.0* 4.9*  ALBUMIN 2.0* 1.9*   No results for input(s): LIPASE, AMYLASE in the last 168 hours. No results for input(s): AMMONIA in the last 168 hours. CBC: Recent Labs  Lab 09/15/17 0951 09/16/17 0300 09/17/17 0500  09/18/17 0313 09/19/17 0540 09/20/17 0426  WBC 31.6* 42.4* 23.1* 23.0* 21.8* 17.6*  NEUTROABS 27.1* 36.9* 19.6* 19.0* 17.7*  --   HGB 10.8* 10.3* 9.0* 9.7* 10.2* 10.1*  HCT 34.4* 32.1* 27.9* 30.0* 31.9* 31.7*  MCV 79.3 77.7* 77.9* 78.3 78.6 78.1  PLT 263 330 231 211 223 210   Cardiac Enzymes:   Recent Labs  Lab 09/14/17 1628 09/15/17 0951 09/16/17 0300  TROPONINI <0.03 0.04* 0.06*   BNP (last 3 results) Recent Labs    09/14/17 1628  BNP 505.8*    ProBNP (last 3 results) No results for input(s): PROBNP in the last 8760 hours.  CBG: No results for input(s): GLUCAP in the last 168 hours.  Recent Results (from the past 240 hour(s))  Culture, blood (routine  x 2)     Status: None   Collection Time: 09/11/17  6:43 PM  Result Value Ref Range Status   Specimen Description BLOOD LEFT ANTECUBITAL  Final   Special Requests   Final    BOTTLES DRAWN AEROBIC ONLY Blood Culture adequate volume   Culture   Final    NO GROWTH 5 DAYS Performed at [facility], [place] [address], [place], [place] [address]    Report Status 09/16/2017 FINAL  Final  Culture, blood (routine x 2)     Status: None   Collection Time: 09/11/17  6:55 PM  Result Value Ref Range Status   Specimen Description BLOOD RIGHT ANTECUBITAL  Final   Special Requests IN PEDIATRIC BOTTLE Blood Culture adequate volume  Final   Culture   Final    NO GROWTH 5 DAYS Performed at [facility], [place] [address] [place], [place] [address]    Report Status 09/16/2017 FINAL  Final  Culture, expectorated sputum-assessment     Status: None   Collection Time: 09/14/17 10:34 PM  Result Value Ref Range Status   Specimen Description EXPECTORATED SPUTUM  Final   Special Requests Immunocompromised  Final   Sputum evaluation THIS SPECIMEN IS ACCEPTABLE FOR SPUTUM CULTURE  Final   Report Status 09/14/2017 FINAL  Final  Culture, respiratory (NON-Expectorated)     Status: None   Collection Time: 09/14/17 10:34 PM  Result  Value Ref Range Status   Specimen Description EXPECTORATED SPUTUM  Final   Special Requests Immunocompromised Reflexed from [id number]  Final   Gram Stain   Final    ABUNDANT WBC PRESENT, PREDOMINANTLY PMN FEW SQUAMOUS EPITHELIAL CELLS PRESENT MODERATE YEAST WITH PSEUDOHYPHAE MODERATE GRAM POSITIVE RODS RARE GRAM NEGATIVE RODS Performed at [facility], [place] [address], [place], [place] [address]    Culture ABUNDANT CANDIDA ALBICANS  Final   Report Status 09/18/2017 FINAL  Final  Culture, blood (x 2)     Status: None (Preliminary result)   Collection Time: 09/15/17  9:48 AM  Result Value Ref Range Status   Specimen Description BLOOD RIGHT ANTECUBITAL  Final   Special Requests IN PEDIATRIC BOTTLE Blood Culture adequate volume  Final   Culture   Final    NO GROWTH 4 DAYS Performed at [facility], [place] [address], [place],  [address]    Report Status PENDING  Incomplete  Culture, blood (x 2)     Status: None (Preliminary result)   Collection Time: 09/15/17  9:48 AM  Result Value Ref Range Status   Specimen Description BLOOD BLOOD RIGHT HAND  Final   Special Requests IN PEDIATRIC BOTTLE Blood Culture adequate volume  Final   Culture   Final    NO GROWTH 4 DAYS Performed at [facility], [place] [address] [place],  [address]    Report Status PENDING  Incomplete  C difficile quick scan w PCR reflex     Status: None   Collection Time: 09/15/17 11:42 AM  Result Value Ref Range Status   C Diff antigen NEGATIVE NEGATIVE Final   C Diff toxin NEGATIVE NEGATIVE Final   C Diff interpretation NEGATIVE  Final  Gastrointestinal Panel by PCR , Stool     Status: None   Collection Time: 09/15/17 11:42 AM  Result Value Ref Range Status   Campylobacter species NOT DETECTED NOT DETECTED Final   Plesimonas shigelloides NOT DETECTED NOT DETECTED Final   Salmonella species NOT DETECTED NOT DETECTED  Final   Yersinia enterocolitica NOT DETECTED NOT DETECTED Final   Vibrio  species NOT DETECTED NOT DETECTED Final   Vibrio cholerae NOT DETECTED NOT DETECTED Final   Enteroaggregative E coli (EAEC) NOT DETECTED NOT DETECTED Final   Enteropathogenic E coli (EPEC) NOT DETECTED NOT DETECTED Final   Enterotoxigenic E coli (ETEC) NOT DETECTED NOT DETECTED Final   Shiga like toxin producing E coli (STEC) NOT DETECTED NOT DETECTED Final   Shigella/Enteroinvasive E coli (EIEC) NOT DETECTED NOT DETECTED Final   Cryptosporidium NOT DETECTED NOT DETECTED Final   Cyclospora cayetanensis NOT DETECTED NOT DETECTED Final   Entamoeba histolytica NOT DETECTED NOT DETECTED Final   Giardia lamblia NOT DETECTED NOT DETECTED Final   Adenovirus F40/41 NOT DETECTED NOT DETECTED Final   Astrovirus NOT DETECTED NOT DETECTED Final   Norovirus GI/GII NOT DETECTED NOT DETECTED Final   Rotavirus A NOT DETECTED NOT DETECTED Final   Sapovirus (I, II, IV, and V) NOT DETECTED NOT DETECTED Final  Respiratory Panel by PCR     Status: None   Collection Time: 09/15/17  3:25 PM  Result Value Ref Range Status   Adenovirus NOT DETECTED NOT DETECTED Final   Coronavirus 229E NOT DETECTED NOT DETECTED Final   Coronavirus HKU1 NOT DETECTED NOT DETECTED Final   Coronavirus NL63 NOT DETECTED NOT DETECTED Final   Coronavirus OC43 NOT DETECTED NOT DETECTED Final   Metapneumovirus NOT DETECTED NOT DETECTED Final   Rhinovirus / Enterovirus NOT DETECTED NOT DETECTED Final   Influenza A NOT DETECTED NOT DETECTED Final   Influenza B NOT DETECTED NOT DETECTED Final   Parainfluenza Virus 1 NOT DETECTED NOT DETECTED Final   Parainfluenza Virus 2 NOT DETECTED NOT DETECTED Final   Parainfluenza Virus 3 NOT DETECTED NOT DETECTED Final   Parainfluenza Virus 4 NOT DETECTED NOT DETECTED Final   Respiratory Syncytial Virus NOT DETECTED NOT DETECTED Final   Bordetella pertussis NOT DETECTED NOT DETECTED Final   Chlamydophila pneumoniae NOT DETECTED NOT DETECTED Final   Mycoplasma pneumoniae NOT DETECTED NOT DETECTED  Final    Comment: Performed at [facility], [place]. [address] [place], [place] [address]  MRSA PCR Screening     Status: None   Collection Time: 09/15/17  3:25 PM  Result Value Ref Range Status   MRSA by PCR NEGATIVE NEGATIVE Final    Comment:        The GeneXpert MRSA Assay (FDA approved for NASAL specimens only), is one component of a comprehensive MRSA colonization surveillance program. It is not intended to diagnose MRSA infection nor to guide or monitor treatment for MRSA infections.      Studies: Ct Chest Wo Contrast  Result Date: 09/19/2017 CLINICAL DATA:  Cough, persistent. EXAM: CT CHEST WITHOUT CONTRAST TECHNIQUE: Multidetector CT imaging of the chest was performed following the standard protocol without IV contrast. COMPARISON:  CT chest 09/16/2017 FINDINGS: Cardiovascular: Heart is enlarged. Coronary artery calcifications are again noted. Atherosclerotic calcifications are present along the undersurface of the arch. There is no aneurysm. Pulmonary arteries are normal in size. Mediastinum/Nodes: No significant mediastinal or axillary adenopathy is present. Secretions previously noted in the left mainstem bronchus and proximal lobar arteries has resolved. The esophagus is mildly dilated. A small hiatal hernia is present. Lungs/Pleura: Left greater than right medial lower lobe airspace opacities are again seen. There is fluid or soft tissue in the posteromedial basilar segmental bronchi. Peribronchial thickening is seen posteriorly in the lingula and in the right middle lobe. Bilateral  pleural effusions have increased, left greater than right. Loculated fluid is again noted along the left major fissure. Upper Abdomen: Layering density suggests small gallstones. There is no inflammatory change of the gallbladder. The visualized liver is within normal limits. Visualized solid organs are otherwise unremarkable. 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  IMPRESSION: 1. Interval clearing of material within left mainstem bronchus. 2. Persistent left lower lobe pneumonia. 3. Mild right lower lobe atelectasis versus early lobar pneumonia. 4. Increased bilateral pleural effusions, left greater than right. 5. Loculated left pleural effusion along the major fissure. 6. Coronary artery disease. 7.  Aortic Atherosclerosis (ICD10-I70.0). Electronically Signed   By: San Morelle M.D.   On: 09/19/2017 11:50    Scheduled Meds: . chlorhexidine  15 mL Mouth Rinse BID  . Chlorhexidine Gluconate Cloth  6 each Topical Daily  . dorzolamide  1 drop Both Eyes BID   And  . timolol  1 drop Both Eyes BID  . famotidine  20 mg Oral QHS  . feeding supplement (ENSURE ENLIVE)  237 mL Oral BID BM  . furosemide  60 mg Intravenous BID  . guaiFENesin  600 mg Oral BID  . ipratropium  0.5 mg Nebulization TID  . levalbuterol  1.25 mg Nebulization TID  . mouth rinse  15 mL Mouth Rinse q12n4p  . metoprolol tartrate  50 mg Oral BID  . multivitamin with minerals  1 tablet Oral Daily  . nystatin  5 mL Oral QID  . potassium chloride  40 mEq Oral Q4H  . sodium bicarbonate  650 mg Oral BID  . Warfarin - Pharmacist Dosing Inpatient   Does not apply q1800    Continuous Infusions: . diltiazem (CARDIZEM) infusion Stopped (09/19/17 1107)  . linezolid (ZYVOX) IV Stopped (09/19/17 2157)  . meropenem (MERREM) IV Stopped (09/20/17 0258)     Time spent: 4mins I have personally reviewed and interpreted on  09/20/2017 daily labs, tele strips, imagings as discussed above under date review session and assessment and plans.  I reviewed all nursing notes, pharmacy notes, consultant notes,  vitals, pertinent old records  I have discussed plan of care as described above with RN , patient and family on 09/20/2017   Florencia Reasons MD, PhD  [facility] Pager [phone]. If 7PM-7AM, please contact night-coverage at [email or web address], password [facility] 09/20/2017, 8:12 AM  LOS: 6 days

## 2017-09-20 NOTE — NC FL2 (Signed)
[place] LEVEL OF CARE SCREENING TOOL     IDENTIFICATION  Patient Name: [name] Birthdate: [birth year] Sex: male Admission Date (Current Location): 09/14/2017  [facility] and [place] Number:  Herbalist and Address:  [facility],  [place] [address] [place]      Provider Number: [id number]  Attending Physician Name and Address:  Florencia Reasons, MD  Relative Name and Phone Number:       Current Level of Care: Hospital Recommended Level of Care: [place] Prior Approval Number:    Date Approved/Denied:   PASRR Number: [id number]  Discharge Plan: SNF    Current Diagnoses: Patient Active Problem List   Diagnosis Date Noted  . Pressure injury of skin 09/16/2017  . Sepsis due to pneumonia ([place]) 09/14/2017  . HLD (hyperlipidemia) 09/11/2017  . Essential hypertension 09/11/2017  . Hyponatremia 09/11/2017  . ARF (acute renal failure) ([place]) 09/11/2017  . Leukocytosis 09/11/2017  . Port catheter in place 07/17/2017  . Colon cancer ([place]) 06/06/2017  . History of ventral hernia repair, 06/23/2013 07/10/2013  . Umbilical hernia 84/16/6063    Orientation RESPIRATION BLADDER Height & Weight     Self, Time, Situation, Place  O2(2L) Continent, External catheter Weight: 281 lb 1.4 oz (127.5 kg) Height:  6\' 2"  (188 cm)  BEHAVIORAL SYMPTOMS/MOOD NEUROLOGICAL BOWEL NUTRITION STATUS      Continent Diet(Regular)  AMBULATORY STATUS COMMUNICATION OF NEEDS Skin   Extensive Assist Verbally Normal                       Personal Care Assistance Level of Assistance  Bathing, Feeding, Dressing Bathing Assistance: Limited assistance Feeding assistance: Limited assistance Dressing Assistance: Limited assistance     Functional Limitations Info  Sight, Hearing, Speech Sight Info: Adequate Hearing Info: Adequate Speech Info: Adequate    SPECIAL CARE FACTORS FREQUENCY  PT (By licensed PT), OT (By licensed OT)     PT  Frequency: 5x/week OT Frequency: 5x/week            Contractures Contractures Info: Not present    Additional Factors Info  Code Status, Allergies Code Status Info: Fullcode Allergies Info: No Known Allergies           Current Medications (09/20/2017):  This is the current hospital active medication list Current Facility-Administered Medications  Medication Dose Route Frequency Provider Last Rate Last Dose  . ALPRAZolam Duanne Moron) tablet 0.5 mg  0.5 mg Oral TID PRN Hugelmeyer, Alexis, DO   0.5 mg at 09/19/17 2057  . amoxicillin-clavulanate (AUGMENTIN) 500-125 MG per tablet 500 mg  1 tablet Oral BID Florencia Reasons, MD   500 mg at 09/20/17 1453  . chlorhexidine (PERIDEX) 0.12 % solution 15 mL  15 mL Mouth Rinse BID Hugelmeyer, Alexis, DO   15 mL at 09/18/17 2233  . Chlorhexidine Gluconate Cloth 2 % PADS 6 each  6 each Topical Daily Brand Males, MD   6 each at 09/19/17 1220  . diltiazem (CARDIZEM) 100 mg in dextrose 5% 121mL (1 mg/mL) infusion  5-15 mg/hr Intravenous Titrated Anders Simmonds, MD   Stopped at 09/19/17 1107  . diphenoxylate-atropine (LOMOTIL) 2.5-0.025 MG per tablet 2 tablet  2 tablet Oral QID PRN Erick Colace, NP   2 tablet at 09/20/17 0160  . dorzolamide (TRUSOPT) 2 % ophthalmic solution 1 drop  1 drop Both Eyes BID Hugelmeyer, Alexis, DO   1 drop at 09/20/17 1003   And  .  timolol (TIMOPTIC) 0.5 % ophthalmic solution 1 drop  1 drop Both Eyes BID Hugelmeyer, Alexis, DO   1 drop at 09/20/17 0956  . famotidine (PEPCID) tablet 20 mg  20 mg Oral QHS Brand Males, MD   20 mg at 09/18/17 2232  . feeding supplement (ENSURE ENLIVE) (ENSURE ENLIVE) liquid 237 mL  237 mL Oral BID BM Rush Farmer, MD   237 mL at 09/20/17 1454  . furosemide (LASIX) injection 60 mg  60 mg Intravenous BID Florencia Reasons, MD   60 mg at 09/20/17 0908  . guaiFENesin (MUCINEX) 12 hr tablet 600 mg  600 mg Oral BID Florencia Reasons, MD   600 mg at 09/20/17 0981  . ipratropium (ATROVENT) nebulizer solution 0.5  mg  0.5 mg Nebulization Q6H PRN Florencia Reasons, MD      . ipratropium (ATROVENT) nebulizer solution 0.5 mg  0.5 mg Nebulization TID Florencia Reasons, MD   0.5 mg at 09/20/17 1401  . levalbuterol (XOPENEX) nebulizer solution 0.63 mg  0.63 mg Nebulization Q3H PRN Florencia Reasons, MD   0.63 mg at 09/19/17 1625  . levalbuterol (XOPENEX) nebulizer solution 1.25 mg  1.25 mg Nebulization TID Florencia Reasons, MD   1.25 mg at 09/20/17 1401  . magnesium citrate solution 1 Bottle  1 Bottle Oral Once PRN Hugelmeyer, Alexis, DO      . MEDLINE mouth rinse  15 mL Mouth Rinse q12n4p Hugelmeyer, Alexis, DO   15 mL at 09/17/17 1200  . metoprolol tartrate (LOPRESSOR) tablet 50 mg  50 mg Oral BID Florencia Reasons, MD   50 mg at 09/20/17 1914  . multivitamin with minerals tablet 1 tablet  1 tablet Oral Daily Rush Farmer, MD   1 tablet at 09/20/17 [phone]  . nystatin (MYCOSTATIN) 100000 UNIT/ML suspension 500,000 Units  5 mL Oral QID Erick Colace, NP   500,000 Units at 09/20/17 1453  . sodium bicarbonate tablet 650 mg  650 mg Oral BID Erick Colace, NP   650 mg at 09/20/17 0955  . sodium chloride flush (NS) 0.9 % injection 10-40 mL  10-40 mL Intracatheter PRN Brand Males, MD      . tamsulosin (FLOMAX) capsule 0.4 mg  0.4 mg Oral Daily Florencia Reasons, MD      . Warfarin - Pharmacist Dosing Inpatient   Does not apply q1800 Adrian Saran [place] at 09/18/17 1800     Discharge Medications: Please see discharge summary for a list of discharge medications.  Relevant Imaging Results:  Relevant Lab Results:   Additional Information ssn:[id number]  Lia Hopping, LCSW

## 2017-09-20 NOTE — Clinical Social Work Note (Signed)
Clinical Social Work Assessment  Patient Details  Name: [name] MRN: [id number] Date of Birth: 1946/05/18  Date of referral:  09/20/17               Reason for consult:  Facility Placement                Permission sought to share information with:  Facility Sport and exercise psychologist, Family Supports Permission granted to share information::     Name::     Pappas,Martha   Agency::     Relationship::  Friend  Contact Information:  [phone]    Housing/Transportation Living arrangements for the past 2 months:  Single Family Home Source of Information:  Patient Patient Interpreter Needed:  None Criminal Activity/Legal Involvement Pertinent to Current Situation/Hospitalization:  No - Comment as needed Significant Relationships:  Friend, Adult Children Lives with:  Friends Do you feel safe going back to the place where you live?  Yes Need for family participation in patient care:  Yes(Dependent w/mobility)  Care giving concerns:  Patient admitted for dyspnea. He reports weakness and cough.   Social Worker assessment / plan:  CSW met with patient at bedside, explain role and reason for visit, to assist with SNF placement. Patient oriented x4 and agreeable to talk with CSW. The patient was admitted on 11/7  With hyponatremia, AKI and leukocytosis and discharged home. Patient admitted this admission for weakness and cough. Patient reports this time he is interested in going to skill nursing facility for short rehab before going home. Patient state this is the first time going to rehab at SNF even though he experienced the SNF process with his wife before she died two years ago.   CSW explained SNF process agian and provided a blank SNF list for reference.  Patient Friend also at bedside states she will help pt. choose a SNF because she familiar with some of the facilities in the area.   Plan: Assist with discharge to SNF.   Employment status:  Retired Programmer, applications PT Recommendations:  [place] / Referral to community resources:  [place]  Patient/Family's Response to care: Patient agreeable to following up with SNF placement at discharge.   Patient/Family's Understanding of and Emotional Response to Diagnosis, Current Treatment, and Prognosis: " I cannot get up on my own." Patient understands he is weak and will need rehab. Patient has family support to help with process.    Emotional Assessment Appearance:  Appears stated age Attitude/Demeanor/Rapport:    Affect (typically observed):  Calm, Accepting Orientation:  Oriented to Self, Oriented to Place, Oriented to  Time, Oriented to Situation Alcohol / Substance use:  Not Applicable Psych involvement (Current and /or in the community):   n/a  Discharge Needs  Concerns to be addressed:  Discharge Planning Concerns Readmission within the last 30 days:  No Current discharge risk:  Dependent with Mobility Barriers to Discharge:  Continued Medical Work up   [employer], LCSW 09/20/2017, 12:56 PM

## 2017-09-20 NOTE — Progress Notes (Signed)
ANTICOAGULATION CONSULT NOTE   Pharmacy Consult for warfarin Indication: atrial fibrillation  No Known Allergies  Patient Measurements: Height: 6\' 2"  (188 cm) Weight: 281 lb 1.4 oz (127.5 kg) IBW/kg (Calculated) : 82.2 HEPARIN DW (KG): 106.9  Vital Signs: Temp: 97.7 F (36.5 C) (11/16 0800) Temp Source: Core (Comment) (11/16 0023) BP: 132/80 (11/16 0800) Pulse Rate: 98 (11/16 0800)  Labs: Recent Labs    09/18/17 0313 09/19/17 0540 09/20/17 0426  HGB 9.7* 10.2* 10.1*  HCT 30.0* 31.9* 31.7*  PLT 211 223 210  LABPROT 31.5* 35.1* 37.2*  INR 3.07 3.53 3.81  CREATININE 3.32* 2.87* 2.57*   Estimated Creatinine Clearance: 36.3 mL/min (A) (by C-G formula based on SCr of 2.57 mg/dL (H)).  Medications:   Scheduled:  . chlorhexidine  15 mL Mouth Rinse BID  . Chlorhexidine Gluconate Cloth  6 each Topical Daily  . dorzolamide  1 drop Both Eyes BID   And  . timolol  1 drop Both Eyes BID  . famotidine  20 mg Oral QHS  . feeding supplement (ENSURE ENLIVE)  237 mL Oral BID BM  . furosemide  60 mg Intravenous BID  . guaiFENesin  600 mg Oral BID  . ipratropium  0.5 mg Nebulization TID  . levalbuterol  1.25 mg Nebulization TID  . mouth rinse  15 mL Mouth Rinse q12n4p  . metoprolol tartrate  50 mg Oral BID  . multivitamin with minerals  1 tablet Oral Daily  . nystatin  5 mL Oral QID  . potassium chloride  40 mEq Oral Q4H  . sodium bicarbonate  650 mg Oral BID  . Warfarin - Pharmacist Dosing Inpatient   Does not apply q1800   Assessment: 80 yoM readmitted 11/10 pm for worsening SOB, non-productive cough, LE edema. Prev admit 11/7-9 for hypoNa/AKI. Treating for HCAP and Afib.  Hx: Colon Ca, HTN, HPL Afib on Warfarin, LD 11/9, home dose 2mg  daily, was d/c on 1mg  daily  Today, 09/20/2017 - INR supratherapeutic - warfarin restarted 11/13 with cautious one time low dose of 0.5mg , doses held 11/14-11/15 - Hgb low, stable. Platelets WNL. - No bleeding/complications reported.  - On  full liquid diet but poor intake. Diarrhea appears to have subsided per flowsheet data.  Goal of Therapy:  INR 2-3  Plan:  1) No warfarin today 2) Daily INR  Hershal Coria, PharmD, BCPS Pager: [phone] 09/20/2017 12:11 PM

## 2017-09-20 NOTE — Evaluation (Signed)
Occupational Therapy Evaluation Patient Details Name: [name] MRN: [id number] DOB: 01-Mar-1946 Today's Date: 09/20/2017    History of Present Illness 71 yo male admitted with ARF, hypokalemia, supratherapeutic INR. Hx of A fib, colon ca-on chemo, HTN. Septsis and transfer to ICU.   Clinical Impression   Pt was admitted for the above.  He has had a recent decline in function and has limited activity tolerance. He will benefit from continued OT to increase safety and independence with adls.  He needs +2 for safety at this time due to lines.  Mobility is overall mod +2 and LB adls are max +2. Goals are for min guard to mod A in acute setting    Follow Up Recommendations  SNF    Equipment Recommendations  None recommended by OT(pt has a 3:1 commode)    Recommendations for Other Services       Precautions / Restrictions Precautions Precautions: Fall Precaution Comments: monitor BP, O2 Restrictions Weight Bearing Restrictions: No      Mobility Bed Mobility Overal bed mobility: Needs Assistance Bed Mobility: Supine to Sit     Supine to sit: Mod assist     General bed mobility comments: assist with trunk and to pivot hips to EOB  Transfers Overall transfer level: Needs assistance Equipment used: Rolling walker (2 wheeled) Transfers: Sit to/from [facility] Sit to Stand: Mod assist;+2 physical assistance;+2 safety/equipment Stand pivot transfers: Mod assist;+2 physical assistance;+2 safety/equipment       General transfer comment: close guard for safety. shuffle steps. Pivot to [facility] then recliner. using RW. VCs hand placement. SaO2 89% on 3L O2 with activity, HR 122 max with activity, 2/4 dyspnea, VCs for pursed lip breathing    Balance Overall balance assessment: Needs assistance Sitting-balance support: Feet supported Sitting balance-Leahy Scale: Fair     Standing balance support: Bilateral upper extremity supported Standing balance-Leahy  Scale: Poor Standing balance comment: requring RW currently, mildly unsteady initially upon standing                           ADL either performed or assessed with clinical judgement   ADL Overall ADL's : Needs assistance/impaired Eating/Feeding: Independent   Grooming: Set up;Sitting   Upper Body Bathing: Minimal assistance;Sitting   Lower Body Bathing: Maximal assistance;Sit to/from stand   Upper Body Dressing : Minimal assistance;Sitting   Lower Body Dressing: Maximal assistance;Sit to/from stand   Toilet Transfer: Minimal assistance;Stand-pivot;+2 for safety/equipment;BSC;RW   Toileting- Clothing Manipulation and Hygiene: Total assistance;+2 for safety/equipment;Sit to/from stand         General ADL Comments: Pt with poor activity tolerance.  HR up to 122 with brief V-Tach.  Sats down to 89% on 2 liters     Vision         Perception     Praxis      Pertinent Vitals/Pain Pain Assessment: No/denies pain     Hand Dominance     Extremity/Trunk Assessment             Communication     Cognition Arousal/Alertness: Awake/alert Behavior During Therapy: WFL for tasks assessed/performed Overall Cognitive Status: Within Functional Limits for tasks assessed                                     General Comments       Exercises    Shoulder Instructions  Home Living                                          Prior Functioning/Environment                   OT Problem List: Decreased strength;Decreased activity tolerance;Impaired balance (sitting and/or standing);Cardiopulmonary status limiting activity;Decreased knowledge of use of DME or AE      OT Treatment/Interventions: Self-care/ADL training;Energy conservation;DME and/or AE instruction;Therapeutic activities;Patient/family education;Balance training    OT Goals(Current goals can be found in the care plan section) Acute Rehab OT Goals Patient  Stated Goal: to go home OT Goal Formulation: With patient Time For Goal Achievement: 10/04/17 Potential to Achieve Goals: Good ADL Goals Pt Will Perform Grooming: with supervision;standing Pt Will Transfer to Toilet: with min guard assist;bedside commode;ambulating Pt Will Perform Toileting - Clothing Manipulation and hygiene: with min guard assist;sit to/from stand;sitting/lateral leans Additional ADL Goal #1: pt will perform UB adls with set up seated and LB with mod A, sit to stand Additional ADL Goal #2: pt will perform level one theraband exercises with supervision x 10 reps each arm, rest breaks as needed for energy conservation and to keep VSS  OT Frequency: Min 2X/week   Barriers to D/C:            Co-evaluation   Reason for Co-Treatment: For patient/therapist safety PT goals addressed during session: Mobility/safety with mobility        AM-PAC PT "6 Clicks" Daily Activity     Outcome Measure Help from another person eating meals?: None Help from another person taking care of personal grooming?: A Little Help from another person toileting, which includes using toliet, bedpan, or urinal?: A Lot Help from another person bathing (including washing, rinsing, drying)?: A Lot Help from another person to put on and taking off regular upper body clothing?: A Little Help from another person to put on and taking off regular lower body clothing?: A Lot 6 Click Score: 16   End of Session    Activity Tolerance: Patient limited by fatigue Patient left: in chair;with call bell/phone within reach;with chair alarm set  OT Visit Diagnosis: Muscle weakness (generalized) (M62.81);Unsteadiness on feet (R26.81)                Time: 6160-7371 OT Time Calculation (min): 30 min Charges:  OT General Charges $OT Visit: 1 Visit OT Evaluation $OT Eval Moderate Complexity: 1 Mod G-Codes:     [place], OTR/L [phone] 09/20/2017  Isaiha Asare 09/20/2017, 1:05 PM

## 2017-09-21 ENCOUNTER — Inpatient Hospital Stay (HOSPITAL_COMMUNITY): Payer: Medicare HMO

## 2017-09-21 DIAGNOSIS — R609 Edema, unspecified: Secondary | ICD-10-CM

## 2017-09-21 DIAGNOSIS — R0600 Dyspnea, unspecified: Secondary | ICD-10-CM

## 2017-09-21 LAB — BASIC METABOLIC PANEL
ANION GAP: 9 (ref 5–15)
BUN: 29 mg/dL — ABNORMAL HIGH (ref 6–20)
CHLORIDE: 107 mmol/L (ref 101–111)
CO2: 25 mmol/L (ref 22–32)
Calcium: 8.1 mg/dL — ABNORMAL LOW (ref 8.9–10.3)
Creatinine, Ser: 2.33 mg/dL — ABNORMAL HIGH (ref 0.61–1.24)
GFR calc Af Amer: 30 mL/min — ABNORMAL LOW (ref >60)
GFR, EST NON AFRICAN AMERICAN: 26 mL/min — AB (ref >60)
GLUCOSE: 111 mg/dL — AB (ref 65–99)
POTASSIUM: 3.2 mmol/L — AB (ref 3.5–5.1)
SODIUM: 141 mmol/L (ref 135–145)

## 2017-09-21 LAB — CBC
HEMATOCRIT: 31.8 % — AB (ref 39.0–52.0)
HEMOGLOBIN: 10.1 g/dL — AB (ref 13.0–17.0)
MCH: 25 pg — ABNORMAL LOW (ref 26.0–34.0)
MCHC: 31.8 g/dL (ref 30.0–36.0)
MCV: 78.7 fL (ref 78.0–100.0)
Platelets: 186 10*3/uL (ref 150–400)
RBC: 4.04 MIL/uL — ABNORMAL LOW (ref 4.22–5.81)
RDW: 22.2 % — AB (ref 11.5–15.5)
WBC: 14.5 10*3/uL — ABNORMAL HIGH (ref 4.0–10.5)

## 2017-09-21 LAB — MAGNESIUM: Magnesium: 1.5 mg/dL — ABNORMAL LOW (ref 1.7–2.4)

## 2017-09-21 LAB — PSA: PROSTATIC SPECIFIC ANTIGEN: 6.29 ng/mL — AB (ref 0.00–4.00)

## 2017-09-21 LAB — PROTIME-INR
INR: 3.4
Prothrombin Time: 34.1 seconds — ABNORMAL HIGH (ref 11.4–15.2)

## 2017-09-21 MED ORDER — MAGNESIUM SULFATE 4 GM/100ML IV SOLN
4.0000 g | Freq: Once | INTRAVENOUS | Status: AC
  Administered 2017-09-21: 4 g via INTRAVENOUS
  Filled 2017-09-21: qty 100

## 2017-09-21 MED ORDER — POTASSIUM CHLORIDE CRYS ER 20 MEQ PO TBCR
40.0000 meq | EXTENDED_RELEASE_TABLET | Freq: Once | ORAL | Status: AC
  Administered 2017-09-21: 40 meq via ORAL
  Filled 2017-09-21: qty 2

## 2017-09-21 MED ORDER — IPRATROPIUM BROMIDE 0.02 % IN SOLN: 0.5000 mg | Freq: Four times a day (QID) | RESPIRATORY_TRACT | Status: DC | PRN

## 2017-09-21 NOTE — Progress Notes (Signed)
[facility] for warfarin Indication: atrial fibrillation  No Known Allergies  Patient Measurements: Height: 6' 2.5" (189.2 cm) Weight: 284 lb 6.3 oz (129 kg) IBW/kg (Calculated) : 83.35 HEPARIN DW (KG): 111.6  Vital Signs: Temp: 98.8 F (37.1 C) (11/17 0515) Temp Source: Axillary (11/17 0515) BP: 131/61 (11/17 0945) Pulse Rate: 105 (11/17 0945)  Labs: Recent Labs    09/19/17 0540 09/20/17 0426 09/21/17 0454  HGB 10.2* 10.1* 10.1*  HCT 31.9* 31.7* 31.8*  PLT 223 210 186  LABPROT 35.1* 37.2* 34.1*  INR 3.53 3.81 3.40  CREATININE 2.87* 2.57* 2.33*   Estimated Creatinine Clearance: 40.6 mL/min (A) (by C-G formula based on SCr of 2.33 mg/dL (H)).  Medications:   Scheduled:  . amoxicillin-clavulanate  1 tablet Oral BID  . chlorhexidine  15 mL Mouth Rinse BID  . Chlorhexidine Gluconate Cloth  6 each Topical Daily  . dorzolamide  1 drop Both Eyes BID   And  . timolol  1 drop Both Eyes BID  . famotidine  20 mg Oral QHS  . feeding supplement (ENSURE ENLIVE)  237 mL Oral BID BM  . furosemide  60 mg Intravenous BID  . guaiFENesin  600 mg Oral BID  . ipratropium  0.5 mg Nebulization TID  . levalbuterol  1.25 mg Nebulization TID  . mouth rinse  15 mL Mouth Rinse q12n4p  . metoprolol tartrate  50 mg Oral BID  . multivitamin with minerals  1 tablet Oral Daily  . nystatin  5 mL Oral QID  . sodium bicarbonate  650 mg Oral BID  . tamsulosin  0.4 mg Oral Daily  . Warfarin - Pharmacist Dosing Inpatient   Does not apply q1800   Assessment: 74 yoM readmitted 11/10 pm for worsening SOB, non-productive cough, LE edema. Prev admit 11/7-9 for hypoNa/AKI. Treating for HCAP and Afib.  Hx: Colon Ca, HTN, HPL Afib on Warfarin, LD 11/9, home dose 2mg  daily, was d/c on 1mg  daily  Today, 09/21/2017 - INR supratherapeutic - warfarin restarted 11/13 with cautious one time low dose of 0.5mg , doses held 11/14-11/16 - Hgb low, stable. Platelets WNL. - No  bleeding/complications reported.  - Changed to regular diet 11/16- ate 100% of meal tray last night   Goal of Therapy:  INR 2-3  Plan:  1) No warfarin today 2) Daily INR  Netta Ced[place], PharmD, BCPS Pager: [phone] 09/21/2017 10:40 AM

## 2017-09-21 NOTE — Progress Notes (Signed)
VASCULAR LAB PRELIMINARY  PRELIMINARY  PRELIMINARY  PRELIMINARY  Left lower extremity venous duplex completed.    Preliminary report:  There is no DVT or SVT noted in the left lower extremity.  Arnold Depinto, RVT 09/21/2017, 4:01 PM

## 2017-09-21 NOTE — Progress Notes (Signed)
Pt had an uneventful day. Cont with plan of care 

## 2017-09-21 NOTE — Progress Notes (Signed)
Report called to [facility] RN. Patient transferred to room 1416.

## 2017-09-21 NOTE — Progress Notes (Addendum)
PROGRESS NOTE    [name]  VOZ:[id number] DOB: [birth year] DOA: 09/14/2017 PCP: [name], MD   Brief Narrative: H/o afib on coumadin, H/o colon cancer (II and IIIb) s/p colectomy under adjuvent chemotherapy cycle 6, sent from infusion center due to hypotension, leukocytosis, diminished oral intake for 2 weeks. He was admitted to hospitalist service, then  Transferred to icu due to severe sepsis/pna/hypotension, afib/rvr. He received vanc/zosyn initially, due to wrosening of renal function, abx changed to zyvox and imipenem , plan for total of 7 days, last dose on 11/19.  Improving, transfer back to hospitalist service on 11/14  Assessment & Plan:   Active Problems:   Sepsis due to pneumonia ([place])   Pressure injury of skin   Sepsis/septic shock/pna/acute hypoxia: -CT chest demonstrates primarily mucus plugging and pneumonia , blood culture negative, strep pneumo antigen and leginella antigen negative.  -he required fluids resuscitation and neo-synephrine from 11/11 to 11/13,  -attempted US guided therapeutic and diagnostic thoracentesis on 11/12 aborted due to minimal fluids -- repeat ct chest on 11/15 due to persistent cough,  "1. Interval clearing of material within left mainstem bronchus. 2. Persistent left lower lobe pneumonia. 3. Mild right lower lobe atelectasis versus early lobar pneumonia. 4. Increased bilateral pleural effusions, left greater than right. 5. Loculated left pleural effusion along the major fissure." - vancomycin 11/10 - 11/12 - zosyn 11/10 - 11/12 - meropenem 11/12 - 11/16 - linezolid 11/12 - 11/16 - Augmentin 11/16 - present -he finished total of 7 days of broad spectrum abx with vanc/zosyn then linezolid and meropenem. abx change to oral augmentin for another three days.  -he did well with speech eval, now on regular diet. Continue diuresis, wean oxygen as tolerated, transfer to med tele on 11/16.  Generalized edema/pleural effusion:  -Likely  fluids overload from resuscitation -lasix 60 IV BID ordered [ ]  will get echo  Afib/RVR -He was on cardizem drip, off heparin drip (was on heparin drip due to needing thoracentesis), restarted coumadin. -cardiology Dr Virgina Jock consulted. Detail please refer to consult note on 11/15. -improving, transition to oral med, titrate lopressor  AKI: From sepsis? Renal US on 11/8 no obstruction Cr seems peaked at 3.65, cr improving, renal dosing meds  Acute urinary retention: has to reinsert foley on 11/16 Check psa, start flomax.  Oral candidiasis:  Oral candidiasis probably caused Sputum + candida  Topical nystatin, improving Denies dysphagia  HTN/HLD: Admitted with sepsis  discontinue cardizem drip on 11/15, restart lopressor Continue titrate meds  Colon ca (II and IIIb) : -s/p colectomy under adjuvent chemotherapy cycle 5 with Folfox on 10/24, planned cycle 6 Folfox cancelled due to  Hospitalization. -oncology aware of hospitalization  Decondition:  FTT, SNF recommended   L>R LEE: will get US  DVT prophylaxis: warfarin Code Status: full  Family Communication: wife present Disposition Plan: pending improvement  Consultants:   Critical care, cards, oncology  Procedures: (Don't include imaging studies which can be auto populated. Include things that cannot be auto populated i.e. Echo, Carotid and venous dopplers, Foley, Bipap, HD, tubes/drains, wound vac, central lines etc) Aline placement on 11/11, 20 gaugecatheter was inserted into right radial arteryusing the Seldinger technique. Aline removed on 11/14   FFP  Transfusion on 11/12 Attempted thoracentesis on 11/12     Antimicrobials: (specify start and planned stop date. Auto populated tables are space occupying and do not give end dates) Anti-infectives (From admission, onward)   Start     Dose/Rate Route Frequency Ordered Stop  09/20/17 1330  amoxicillin-clavulanate (AUGMENTIN) 500-125 MG per tablet  500 mg     1 tablet Oral 2 times daily 09/20/17 1233 09/23/17 0959   09/19/17 1400  meropenem (MERREM) 1 g in sodium chloride 0.9 % 100 mL IVPB  Status:  Discontinued     1 g 200 mL/hr over 30 Minutes Intravenous Every 12 hours 09/19/17 0933 09/20/17 1232   09/17/17 1400  meropenem (MERREM) 500 mg in sodium chloride 0.9 % 50 mL IVPB  Status:  Discontinued     500 mg 100 mL/hr over 30 Minutes Intravenous Every 12 hours 09/17/17 1146 09/19/17 0933   09/16/17 1400  linezolid (ZYVOX) 100 MG/5ML suspension 600 mg  Status:  Discontinued     600 mg Oral Every 12 hours 09/16/17 0939 09/16/17 0951   09/16/17 1400  meropenem (MERREM) 1 g in sodium chloride 0.9 % 100 mL IVPB  Status:  Discontinued     1 g 200 mL/hr over 30 Minutes Intravenous Every 12 hours 09/16/17 0947 09/17/17 1146   09/16/17 1000  linezolid (ZYVOX) IVPB 600 mg  Status:  Discontinued     600 mg 300 mL/hr over 60 Minutes Intravenous Every 12 hours 09/16/17 0938 09/16/17 0940   09/16/17 1000  linezolid (ZYVOX) IVPB 600 mg  Status:  Discontinued     600 mg 300 mL/hr over 60 Minutes Intravenous Every 12 hours 09/16/17 0951 09/20/17 1232   09/16/17 0630  vancomycin (VANCOCIN) 1,250 mg in sodium chloride 0.9 % 250 mL IVPB     1,250 mg 166.7 mL/hr over 90 Minutes Intravenous  Once 09/16/17 0622 09/16/17 0920   09/15/17 2200  vancomycin (VANCOCIN) 1,250 mg in sodium chloride 0.9 % 250 mL IVPB  Status:  Discontinued     1,250 mg 166.7 mL/hr over 90 Minutes Intravenous Every 12 hours 09/14/17 2115 09/15/17 1053   09/15/17 2200  vancomycin (VANCOCIN) IVPB 1000 mg/200 mL premix  Status:  Discontinued     1,000 mg 200 mL/hr over 60 Minutes Intravenous Every 24 hours 09/15/17 1053 09/15/17 1342   09/14/17 2300  piperacillin-tazobactam (ZOSYN) IVPB 3.375 g  Status:  Discontinued     3.375 g 12.5 mL/hr over 240 Minutes Intravenous Every 8 hours 09/14/17 2113 09/16/17 0936   09/14/17 1715  piperacillin-tazobactam (ZOSYN) IVPB 3.375 g      3.375 g 100 mL/hr over 30 Minutes Intravenous  Once 09/14/17 1708 09/14/17 1824   09/14/17 1715  vancomycin (VANCOCIN) 2,000 mg in sodium chloride 0.9 % 500 mL IVPB     2,000 mg 250 mL/hr over 120 Minutes Intravenous  Once 09/14/17 1711 09/14/17 2056        Subjective: No CP.  Notes SOB with exertion.  No abdominal pain.   Feels better overall.   Objective: Vitals:   09/21/17 0515 09/21/17 0804 09/21/17 0809 09/21/17 0945  BP: 112/64   131/61  Pulse: 99   (!) 105  Resp: 18     Temp: 98.8 F (37.1 C)     TempSrc: Axillary     SpO2: 94% 91% 91%   Weight:      Height:        Intake/Output Summary (Last 24 hours) at 09/21/2017 1129 Last data filed at 09/21/2017 1039 Gross per 24 hour  Intake 250 ml  Output 5075 ml  Net -4825 ml   Filed Weights   09/19/17 0500 09/20/17 0358 09/21/17 0133  Weight: 129.7 kg (285 lb 15 oz) 127.5 kg (281 lb 1.4 oz) 129 kg (  284 lb 6.3 oz)    Examination:  General exam: Appears calm and comfortable  Respiratory system: Clear to auscultation. Respiratory effort normal. Cardiovascular system: S1 & S2 heard, RRR. No JVD, murmurs, rubs, gallops or clicks. Gastrointestinal system: Abdomen is nondistended, soft and nontender. No organomegaly or masses felt. Normal bowel sounds heard. Central nervous system: Alert and oriented. No focal neurological deficits. Extremities: L>R LEE, 1-2+ Skin: No rashes, lesions or ulcers Psychiatry: Judgement and insight appear normal. Mood & affect appropriate.     Data Reviewed: I have personally reviewed following labs and imaging studies  CBC: Recent Labs  Lab 09/15/17 0951 09/16/17 0300 09/17/17 0500 09/18/17 0313 09/19/17 0540 09/20/17 0426 09/21/17 0454  WBC 31.6* 42.4* 23.1* 23.0* 21.8* 17.6* 14.5*  NEUTROABS 27.1* 36.9* 19.6* 19.0* 17.7*  --   --   HGB 10.8* 10.3* 9.0* 9.7* 10.2* 10.1* 10.1*  HCT 34.4* 32.1* 27.9* 30.0* 31.9* 31.7* 31.8*  MCV 79.3 77.7* 77.9* 78.3 78.6 78.1 78.7  PLT 263 330  231 211 223 210 664   Basic Metabolic Panel: Recent Labs  Lab 09/14/17 1628  09/16/17 0300 09/17/17 0500 09/17/17 1126 09/18/17 0313 09/19/17 0540 09/20/17 0426 09/21/17 0454  NA 136   < > 141 143  --   --  136 143 141  K 3.5   < > 3.1* 3.2*  --   --  3.0* 3.1* 3.2*  CL 113*   < > 113* 117*  --   --  109 112* 107  CO2 14*   < > 18* 18*  --   --  19* 23 25  GLUCOSE 176*   < > 135* 92  --   --  146* 114* 111*  BUN 16   < > 31* 36*  --   --  34* 34* 29*  CREATININE 1.10   < > 3.10* 3.65* 3.55* 3.32* 2.87* 2.57* 2.33*  CALCIUM 8.6*   < > 7.6* 7.7*  --   --  8.0* 8.3* 8.1*  MG 1.7  --  1.6* 1.7  --  1.7 1.9 1.8  --   PHOS 2.9  --  3.5 3.8  --  3.7 3.4  --   --    < > = values in this interval not displayed.   GFR: Estimated Creatinine Clearance: 40.6 mL/min (Terrie Grajales) (by C-G formula based on SCr of 2.33 mg/dL (H)). Liver Function Tests: Recent Labs  Lab 09/15/17 0951 09/16/17 0300  AST 21 28  ALT 23 22  ALKPHOS 79 88  BILITOT 1.0 0.8  PROT 5.0* 4.9*  ALBUMIN 2.0* 1.9*   No results for input(s): LIPASE, AMYLASE in the last 168 hours. No results for input(s): AMMONIA in the last 168 hours. Coagulation Profile: Recent Labs  Lab 09/17/17 0030 09/18/17 0313 09/19/17 0540 09/20/17 0426 09/21/17 0454  INR 2.90 3.07 3.53 3.81 3.40   Cardiac Enzymes: Recent Labs  Lab 09/14/17 1628 09/15/17 0951 09/16/17 0300  TROPONINI <0.03 0.04* 0.06*   BNP (last 3 results) No results for input(s): PROBNP in the last 8760 hours. HbA1C: No results for input(s): HGBA1C in the last 72 hours. CBG: Recent Labs  Lab 09/20/17 2218  GLUCAP 157*   Lipid Profile: Recent Labs    09/19/17 0540  CHOL 79  HDL 24*  LDLCALC 39  TRIG 79  CHOLHDL 3.3   Thyroid Function Tests: Recent Labs    09/20/17 0643  TSH 1.727   Anemia Panel: No results for input(s): VITAMINB12, FOLATE, FERRITIN,  TIBC, IRON, RETICCTPCT in the last 72 hours. Sepsis Labs: Recent Labs  Lab 09/14/17 1746  09/15/17 0951 09/15/17 1652  PROCALCITON  --  2.14  --   LATICACIDVEN 1.4 1.3 1.2    Recent Results (from the past 240 hour(s))  Culture, blood (routine x 2)     Status: None   Collection Time: 09/11/17  6:43 PM  Result Value Ref Range Status   Specimen Description BLOOD LEFT ANTECUBITAL  Final   Special Requests   Final    BOTTLES DRAWN AEROBIC ONLY Blood Culture adequate volume   Culture   Final    NO GROWTH 5 DAYS Performed at [facility], [address], [place], [place] [address]    Report Status 09/16/2017 FINAL  Final  Culture, blood (routine x 2)     Status: None   Collection Time: 09/11/17  6:55 PM  Result Value Ref Range Status   Specimen Description BLOOD RIGHT ANTECUBITAL  Final   Special Requests IN PEDIATRIC BOTTLE Blood Culture adequate volume  Final   Culture   Final    NO GROWTH 5 DAYS Performed at [facility], [place] [address] [place], [place] [address]    Report Status 09/16/2017 FINAL  Final  Culture, expectorated sputum-assessment     Status: None   Collection Time: 09/14/17 10:34 PM  Result Value Ref Range Status   Specimen Description EXPECTORATED SPUTUM  Final   Special Requests Immunocompromised  Final   Sputum evaluation THIS SPECIMEN IS ACCEPTABLE FOR SPUTUM CULTURE  Final   Report Status 09/14/2017 FINAL  Final  Culture, respiratory (NON-Expectorated)     Status: None   Collection Time: 09/14/17 10:34 PM  Result Value Ref Range Status   Specimen Description EXPECTORATED SPUTUM  Final   Special Requests Immunocompromised Reflexed from [id number]  Final   Gram Stain   Final    ABUNDANT WBC PRESENT, PREDOMINANTLY PMN FEW SQUAMOUS EPITHELIAL CELLS PRESENT MODERATE YEAST WITH PSEUDOHYPHAE MODERATE GRAM POSITIVE RODS RARE GRAM NEGATIVE RODS Performed at [facility], [place] [address], [place], [place] [address]    Culture ABUNDANT CANDIDA ALBICANS  Final   Report Status 09/18/2017 FINAL  Final  Culture, blood (x 2)     Status: None     Collection Time: 09/15/17  9:48 AM  Result Value Ref Range Status   Specimen Description BLOOD RIGHT ANTECUBITAL  Final   Special Requests IN PEDIATRIC BOTTLE Blood Culture adequate volume  Final   Culture   Final    NO GROWTH 5 DAYS Performed at [facility], [place] [address], [place], [place] [address]    Report Status 09/20/2017 FINAL  Final  Culture, blood (x 2)     Status: None   Collection Time: 09/15/17  9:48 AM  Result Value Ref Range Status   Specimen Description BLOOD BLOOD RIGHT HAND  Final   Special Requests IN PEDIATRIC BOTTLE Blood Culture adequate volume  Final   Culture   Final    NO GROWTH 5 DAYS Performed at [facility], [place] [address] [place], [place] [address]    Report Status 09/20/2017 FINAL  Final  C difficile quick scan w PCR reflex     Status: None   Collection Time: 09/15/17 11:42 AM  Result Value Ref Range Status   C Diff antigen NEGATIVE NEGATIVE Final   C Diff toxin NEGATIVE NEGATIVE Final   C Diff interpretation NEGATIVE  Final  Gastrointestinal Panel by  PCR , Stool     Status: None   Collection Time: 09/15/17 11:42 AM  Result Value Ref Range Status   Campylobacter species NOT DETECTED NOT DETECTED Final   Plesimonas shigelloides NOT DETECTED NOT DETECTED Final   Salmonella species NOT DETECTED NOT DETECTED Final   Yersinia enterocolitica NOT DETECTED NOT DETECTED Final   Vibrio species NOT DETECTED NOT DETECTED Final   Vibrio cholerae NOT DETECTED NOT DETECTED Final   Enteroaggregative E coli (EAEC) NOT DETECTED NOT DETECTED Final   Enteropathogenic E coli (EPEC) NOT DETECTED NOT DETECTED Final   Enterotoxigenic E coli (ETEC) NOT DETECTED NOT DETECTED Final   Shiga like toxin producing E coli (STEC) NOT DETECTED NOT DETECTED Final   Shigella/Enteroinvasive E coli (EIEC) NOT DETECTED NOT DETECTED Final   Cryptosporidium NOT DETECTED NOT DETECTED Final   Cyclospora cayetanensis NOT DETECTED NOT DETECTED Final   Entamoeba histolytica  NOT DETECTED NOT DETECTED Final   Giardia lamblia NOT DETECTED NOT DETECTED Final   Adenovirus F40/41 NOT DETECTED NOT DETECTED Final   Astrovirus NOT DETECTED NOT DETECTED Final   Norovirus GI/GII NOT DETECTED NOT DETECTED Final   Rotavirus Jakel Alphin NOT DETECTED NOT DETECTED Final   Sapovirus (I, II, IV, and V) NOT DETECTED NOT DETECTED Final  Respiratory Panel by PCR     Status: None   Collection Time: 09/15/17  3:25 PM  Result Value Ref Range Status   Adenovirus NOT DETECTED NOT DETECTED Final   Coronavirus 229E NOT DETECTED NOT DETECTED Final   Coronavirus HKU1 NOT DETECTED NOT DETECTED Final   Coronavirus NL63 NOT DETECTED NOT DETECTED Final   Coronavirus OC43 NOT DETECTED NOT DETECTED Final   Metapneumovirus NOT DETECTED NOT DETECTED Final   Rhinovirus / Enterovirus NOT DETECTED NOT DETECTED Final   Influenza Negan Grudzien NOT DETECTED NOT DETECTED Final   Influenza B NOT DETECTED NOT DETECTED Final   Parainfluenza Virus 1 NOT DETECTED NOT DETECTED Final   Parainfluenza Virus 2 NOT DETECTED NOT DETECTED Final   Parainfluenza Virus 3 NOT DETECTED NOT DETECTED Final   Parainfluenza Virus 4 NOT DETECTED NOT DETECTED Final   Respiratory Syncytial Virus NOT DETECTED NOT DETECTED Final   Bordetella pertussis NOT DETECTED NOT DETECTED Final   Chlamydophila pneumoniae NOT DETECTED NOT DETECTED Final   Mycoplasma pneumoniae NOT DETECTED NOT DETECTED Final    Comment: Performed at [facility], [address] [place], [place] [address]  MRSA PCR Screening     Status: None   Collection Time: 09/15/17  3:25 PM  Result Value Ref Range Status   MRSA by PCR NEGATIVE NEGATIVE Final    Comment:        The GeneXpert MRSA Assay (FDA approved for NASAL specimens only), is one component of Aram Domzalski comprehensive MRSA colonization surveillance program. It is not intended to diagnose MRSA infection nor to guide or monitor treatment for MRSA infections.          Radiology Studies: Ct Chest Wo  Contrast  Result Date: 09/19/2017 CLINICAL DATA:  Cough, persistent. EXAM: CT CHEST WITHOUT CONTRAST TECHNIQUE: Multidetector CT imaging of the chest was performed following the standard protocol without IV contrast. COMPARISON:  CT chest 09/16/2017 FINDINGS: Cardiovascular: Heart is enlarged. Coronary artery calcifications are again noted. Atherosclerotic calcifications are present along the undersurface of the arch. There is no aneurysm. Pulmonary arteries are normal in size. Mediastinum/Nodes: No significant mediastinal or axillary adenopathy is present. Secretions previously noted in the left mainstem bronchus and proximal lobar arteries  has resolved. The esophagus is mildly dilated. Helina Hullum small hiatal hernia is present. Lungs/Pleura: Left greater than right medial lower lobe airspace opacities are again seen. There is fluid or soft tissue in the posteromedial basilar segmental bronchi. Peribronchial thickening is seen posteriorly in the lingula and in the right middle lobe. Bilateral pleural effusions have increased, left greater than right. Loculated fluid is again noted along the left major fissure. Upper Abdomen: Layering density suggests small gallstones. There is no inflammatory change of the gallbladder. The visualized liver is within normal limits. Visualized solid organs are otherwise unremarkable. 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 IMPRESSION: 1. Interval clearing of material within left mainstem bronchus. 2. Persistent left lower lobe pneumonia. 3. Mild right lower lobe atelectasis versus early lobar pneumonia. 4. Increased bilateral pleural effusions, left greater than right. 5. Loculated left pleural effusion along the major fissure. 6. Coronary artery disease. 7.  Aortic Atherosclerosis (ICD10-I70.0). Electronically Signed   By: San Morelle M.D.   On: 09/19/2017 11:50        Scheduled Meds: . amoxicillin-clavulanate  1 tablet Oral BID  .  chlorhexidine  15 mL Mouth Rinse BID  . Chlorhexidine Gluconate Cloth  6 each Topical Daily  . dorzolamide  1 drop Both Eyes BID   And  . timolol  1 drop Both Eyes BID  . famotidine  20 mg Oral QHS  . feeding supplement (ENSURE ENLIVE)  237 mL Oral BID BM  . furosemide  60 mg Intravenous BID  . guaiFENesin  600 mg Oral BID  . ipratropium  0.5 mg Nebulization TID  . levalbuterol  1.25 mg Nebulization TID  . mouth rinse  15 mL Mouth Rinse q12n4p  . metoprolol tartrate  50 mg Oral BID  . multivitamin with minerals  1 tablet Oral Daily  . nystatin  5 mL Oral QID  . sodium bicarbonate  650 mg Oral BID  . tamsulosin  0.4 mg Oral Daily  . Warfarin - Pharmacist Dosing Inpatient   Does not apply q1800   Continuous Infusions:   LOS: 7 days    Time spent: over 30 minutes    Fayrene Helper, MD [facility] Pager [phone]  If 7PM-7AM, please contact night-coverage [email or web address] Password [email or web address] 09/21/2017, 11:29 AM

## 2017-09-21 NOTE — Progress Notes (Signed)
Pt transferred to 1416 from ICU.  Alert and oriented.  Denies pain.  Wife with patient.

## 2017-09-22 ENCOUNTER — Inpatient Hospital Stay (HOSPITAL_COMMUNITY): Payer: Medicare HMO

## 2017-09-22 LAB — BLOOD GAS, VENOUS
Acid-Base Excess: 3.3 mmol/L — ABNORMAL HIGH (ref 0.0–2.0)
Bicarbonate: 27.4 mmol/L (ref 20.0–28.0)
O2 SAT: 67.1 %
PATIENT TEMPERATURE: 98.6
PO2 VEN: 38 mmHg (ref 32.0–45.0)
pCO2, Ven: 42 mmHg — ABNORMAL LOW (ref 44.0–60.0)
pH, Ven: 7.43 (ref 7.250–7.430)

## 2017-09-22 LAB — CBC
HEMATOCRIT: 31 % — AB (ref 39.0–52.0)
Hemoglobin: 9.7 g/dL — ABNORMAL LOW (ref 13.0–17.0)
MCH: 24.6 pg — ABNORMAL LOW (ref 26.0–34.0)
MCHC: 31.3 g/dL (ref 30.0–36.0)
MCV: 78.5 fL (ref 78.0–100.0)
PLATELETS: 161 10*3/uL (ref 150–400)
RBC: 3.95 MIL/uL — AB (ref 4.22–5.81)
RDW: 21.9 % — AB (ref 11.5–15.5)
WBC: 11.7 10*3/uL — AB (ref 4.0–10.5)

## 2017-09-22 LAB — BASIC METABOLIC PANEL
Anion gap: 8 (ref 5–15)
BUN: 28 mg/dL — AB (ref 6–20)
CHLORIDE: 106 mmol/L (ref 101–111)
CO2: 26 mmol/L (ref 22–32)
CREATININE: 2.1 mg/dL — AB (ref 0.61–1.24)
Calcium: 8 mg/dL — ABNORMAL LOW (ref 8.9–10.3)
GFR calc Af Amer: 34 mL/min — ABNORMAL LOW (ref >60)
GFR, EST NON AFRICAN AMERICAN: 30 mL/min — AB (ref >60)
Glucose, Bld: 112 mg/dL — ABNORMAL HIGH (ref 65–99)
Potassium: 3.2 mmol/L — ABNORMAL LOW (ref 3.5–5.1)
SODIUM: 140 mmol/L (ref 135–145)

## 2017-09-22 LAB — MAGNESIUM: MAGNESIUM: 2.5 mg/dL — AB (ref 1.7–2.4)

## 2017-09-22 LAB — PROTIME-INR
INR: 2.88
Prothrombin Time: 29.9 seconds — ABNORMAL HIGH (ref 11.4–15.2)

## 2017-09-22 LAB — ALBUMIN: ALBUMIN: 1.8 g/dL — AB (ref 3.5–5.0)

## 2017-09-22 MED ORDER — POTASSIUM CHLORIDE CRYS ER 20 MEQ PO TBCR
40.0000 meq | EXTENDED_RELEASE_TABLET | ORAL | Status: AC
  Administered 2017-09-22 (×2): 40 meq via ORAL
  Filled 2017-09-22 (×2): qty 2

## 2017-09-22 MED ORDER — METOPROLOL TARTRATE 50 MG PO TABS
75.0000 mg | ORAL_TABLET | Freq: Two times a day (BID) | ORAL | Status: DC
  Administered 2017-09-22 – 2017-09-23 (×2): 75 mg via ORAL
  Filled 2017-09-22 (×2): qty 1

## 2017-09-22 MED ORDER — WARFARIN 0.5 MG HALF TABLET
0.5000 mg | ORAL_TABLET | Freq: Once | ORAL | Status: AC
  Administered 2017-09-22: 0.5 mg via ORAL
  Filled 2017-09-22: qty 1

## 2017-09-22 NOTE — Progress Notes (Signed)
PROGRESS NOTE    [name]  PNT:[id number] DOB: 1946-08-14 DOA: 09/14/2017 PCP: [name], MD   Brief Narrative: H/o afib on coumadin, H/o colon cancer (II and IIIb) s/p colectomy under adjuvent chemotherapy cycle 6, sent from infusion center due to hypotension, leukocytosis, diminished oral intake for 2 weeks. He was admitted to hospitalist service, then  Transferred to icu due to severe sepsis/pna/hypotension, afib/rvr. He received vanc/zosyn initially, due to wrosening of renal function, abx changed to zyvox and imipenem , plan for total of 7 days, last dose on 11/19.  Improving, transfer back to hospitalist service on 11/14  Assessment & Plan:   Active Problems:   Sepsis due to pneumonia ([place])   Pressure injury of skin   Sepsis/septic shock/pna/acute hypoxia: -CT chest demonstrates primarily mucus plugging and pneumonia , blood culture negative, strep pneumo antigen and leginella antigen negative.  -he required fluids resuscitation and neo-synephrine from 11/11 to 11/13,  -attempted US guided therapeutic and diagnostic thoracentesis on 11/12 aborted due to minimal fluids -- repeat ct chest on 11/15 due to persistent cough,  "1. Interval clearing of material within left mainstem bronchus. 2. Persistent left lower lobe pneumonia. 3. Mild right lower lobe atelectasis versus early lobar pneumonia. 4. Increased bilateral pleural effusions, left greater than right. 5. Loculated left pleural effusion along the major fissure." - vancomycin 11/10 - 11/12 - zosyn 11/10 - 11/12 - meropenem 11/12 - 11/16 - linezolid 11/12 - 11/16 - Augmentin 11/16 - present -he finished total of 7 days of broad spectrum abx with vanc/zosyn then linezolid and meropenem. abx change to oral augmentin for another three days.  -he did well with speech eval, now on regular diet. Continue diuresis, wean oxygen as tolerated, transfered to med tele on 11/16.  Generalized edema/pleural effusion:  -Likely  fluids overload from resuscitation -lasix 60 IV BID ordered [ ]  initially ordered echo, but on review of Dr. Bonney Roussel note, repeat echo was not thought to change management.  Will cancel for now.   Afib/RVR -He was on cardizem drip, off heparin drip (was on heparin drip due to needing thoracentesis), restarted coumadin. -cardiology Dr Virgina Jock consulted. Detail please refer to consult note on 11/15. -improving - will uptitrate metop given persistent RVR to 75 mg bid  AKI: From sepsis? Renal US on 11/8 no obstruction Cr seems peaked at 3.65, cr improving with diuresis, renal dosing meds  Acute urinary retention: has to reinsert foley on 11/16 Check psa - elevated, will need f/u with urology flomax. Trial w/o foley once not requiring as much diuresis  Oral candidiasis:  Oral candidiasis probably caused Sputum + candida  Topical nystatin, improving Denies dysphagia  HTN/HLD: Admitted with sepsis  discontinue cardizem drip on 11/15, restart lopressor Continue titrate meds  Colon ca (II and IIIb) : -s/p colectomy under adjuvent chemotherapy cycle 5 with Folfox on 10/24, planned cycle 6 Folfox cancelled due to  Hospitalization. -oncology aware of hospitalization  Decondition:  FTT, SNF recommended   L>R LEE: prelim negative for DVT  Seems drowsy this morning.  No obvious med causes.  A&Ox4.  Will check VBG.  CTM.  Maybe just sleepy in setting of hospitalization, poor sleep.  DVT prophylaxis: warfarin Code Status: full  Family Communication: wife present Disposition Plan: pending improvement  Consultants:   Critical care, cards, oncology  Procedures: (Don't include imaging studies which can be auto populated. Include things that cannot be auto populated i.e. Echo, Carotid and venous dopplers, Foley, Bipap, HD, tubes/drains, wound vac,  central lines etc) Aline placement on 11/11, 20 gaugecatheter was inserted into right radial arteryusing the Seldinger  technique. Aline removed on 11/14   FFP  Transfusion on 11/12 Attempted thoracentesis on 11/12  Preliminary report:  There is no DVT or SVT noted in the left lower extremity.    Antimicrobials: (specify start and planned stop date. Auto populated tables are space occupying and do not give end dates) Anti-infectives (From admission, onward)   Start     Dose/Rate Route Frequency Ordered Stop   09/20/17 1330  amoxicillin-clavulanate (AUGMENTIN) 500-125 MG per tablet 500 mg     1 tablet Oral 2 times daily 09/20/17 1233 09/23/17 0959   09/19/17 1400  meropenem (MERREM) 1 g in sodium chloride 0.9 % 100 mL IVPB  Status:  Discontinued     1 g 200 mL/hr over 30 Minutes Intravenous Every 12 hours 09/19/17 0933 09/20/17 1232   09/17/17 1400  meropenem (MERREM) 500 mg in sodium chloride 0.9 % 50 mL IVPB  Status:  Discontinued     500 mg 100 mL/hr over 30 Minutes Intravenous Every 12 hours 09/17/17 1146 09/19/17 0933   09/16/17 1400  linezolid (ZYVOX) 100 MG/5ML suspension 600 mg  Status:  Discontinued     600 mg Oral Every 12 hours 09/16/17 0939 09/16/17 0951   09/16/17 1400  meropenem (MERREM) 1 g in sodium chloride 0.9 % 100 mL IVPB  Status:  Discontinued     1 g 200 mL/hr over 30 Minutes Intravenous Every 12 hours 09/16/17 0947 09/17/17 1146   09/16/17 1000  linezolid (ZYVOX) IVPB 600 mg  Status:  Discontinued     600 mg 300 mL/hr over 60 Minutes Intravenous Every 12 hours 09/16/17 0938 09/16/17 0940   09/16/17 1000  linezolid (ZYVOX) IVPB 600 mg  Status:  Discontinued     600 mg 300 mL/hr over 60 Minutes Intravenous Every 12 hours 09/16/17 0951 09/20/17 1232   09/16/17 0630  vancomycin (VANCOCIN) 1,250 mg in sodium chloride 0.9 % 250 mL IVPB     1,250 mg 166.7 mL/hr over 90 Minutes Intravenous  Once 09/16/17 0622 09/16/17 0920   09/15/17 2200  vancomycin (VANCOCIN) 1,250 mg in sodium chloride 0.9 % 250 mL IVPB  Status:  Discontinued     1,250 mg 166.7 mL/hr over 90 Minutes Intravenous  Every 12 hours 09/14/17 2115 09/15/17 1053   09/15/17 2200  vancomycin (VANCOCIN) IVPB 1000 mg/200 mL premix  Status:  Discontinued     1,000 mg 200 mL/hr over 60 Minutes Intravenous Every 24 hours 09/15/17 1053 09/15/17 1342   09/14/17 2300  piperacillin-tazobactam (ZOSYN) IVPB 3.375 g  Status:  Discontinued     3.375 g 12.5 mL/hr over 240 Minutes Intravenous Every 8 hours 09/14/17 2113 09/16/17 0936   09/14/17 1715  piperacillin-tazobactam (ZOSYN) IVPB 3.375 g     3.375 g 100 mL/hr over 30 Minutes Intravenous  Once 09/14/17 1708 09/14/17 1824   09/14/17 1715  vancomycin (VANCOCIN) 2,000 mg in sodium chloride 0.9 % 500 mL IVPB     2,000 mg 250 mL/hr over 120 Minutes Intravenous  Once 09/14/17 1711 09/14/17 2056        Subjective: Sleepy. A&Ox4. Denies CP, SOB, palpitations, pain.  Objective: Vitals:   09/21/17 2023 09/21/17 2200 09/22/17 0418 09/22/17 0743  BP:  120/64 116/69   Pulse:  (!) 119 (!) 103   Resp:  18 18   Temp:  98.2 F (36.8 C) 98.1 F (36.7 C)   TempSrc:  Oral Oral   SpO2: 98%  97% 96%  Weight:   120 kg (264 lb 8.8 oz)   Height:        Intake/Output Summary (Last 24 hours) at 09/22/2017 1130 Last data filed at 09/22/2017 0419 Gross per 24 hour  Intake -  Output 1500 ml  Net -1500 ml   Filed Weights   09/20/17 0358 09/21/17 0133 09/22/17 0418  Weight: 127.5 kg (281 lb 1.4 oz) 129 kg (284 lb 6.3 oz) 120 kg (264 lb 8.8 oz)    Examination:  General: No acute distress.  Sleepy Cardiovascular: Heart sounds show a regular rate, and rhythm. No gallops or rubs. No murmurs. No JVD. Lungs: Clear to auscultation bilaterally with good air movement. No rales, rhonchi or wheezes. Abdomen: Soft, nontender, nondistended with normal active bowel sounds. No masses. No hepatosplenomegaly. Neurological: Alert and oriented 3. Moves all extremities 4 with equal strength. Cranial nerves II through XII grossly intact. Skin: Warm and dry. No rashes or  lesions. Extremities: No clubbing or cyanosis. L>R LEE 1-2+ Psychiatric: Mood and affect are normal. Insight and judgment are appropiate.    Data Reviewed: I have personally reviewed following labs and imaging studies  CBC: Recent Labs  Lab 09/16/17 0300 09/17/17 0500 09/18/17 0313 09/19/17 0540 09/20/17 0426 09/21/17 0454 09/22/17 0407  WBC 42.4* 23.1* 23.0* 21.8* 17.6* 14.5* 11.7*  NEUTROABS 36.9* 19.6* 19.0* 17.7*  --   --   --   HGB 10.3* 9.0* 9.7* 10.2* 10.1* 10.1* 9.7*  HCT 32.1* 27.9* 30.0* 31.9* 31.7* 31.8* 31.0*  MCV 77.7* 77.9* 78.3 78.6 78.1 78.7 78.5  PLT 330 231 211 223 210 186 202   Basic Metabolic Panel: Recent Labs  Lab 09/16/17 0300 09/17/17 0500  09/18/17 0313 09/19/17 0540 09/20/17 0426 09/21/17 0454 09/21/17 1615 09/22/17 0407  NA 141 143  --   --  136 143 141  --  140  K 3.1* 3.2*  --   --  3.0* 3.1* 3.2*  --  3.2*  CL 113* 117*  --   --  109 112* 107  --  106  CO2 18* 18*  --   --  19* 23 25  --  26  GLUCOSE 135* 92  --   --  146* 114* 111*  --  112*  BUN 31* 36*  --   --  34* 34* 29*  --  28*  CREATININE 3.10* 3.65*   < > 3.32* 2.87* 2.57* 2.33*  --  2.10*  CALCIUM 7.6* 7.7*  --   --  8.0* 8.3* 8.1*  --  8.0*  MG 1.6* 1.7  --  1.7 1.9 1.8  --  1.5* 2.5*  PHOS 3.5 3.8  --  3.7 3.4  --   --   --   --    < > = values in this interval not displayed.   GFR: Estimated Creatinine Clearance: 43.4 mL/min (A) (by C-G formula based on SCr of 2.1 mg/dL (H)). Liver Function Tests: Recent Labs  Lab 09/16/17 0300  AST 28  ALT 22  ALKPHOS 88  BILITOT 0.8  PROT 4.9*  ALBUMIN 1.9*   No results for input(s): LIPASE, AMYLASE in the last 168 hours. No results for input(s): AMMONIA in the last 168 hours. Coagulation Profile: Recent Labs  Lab 09/18/17 0313 09/19/17 0540 09/20/17 0426 09/21/17 0454 09/22/17 0407  INR 3.07 3.53 3.81 3.40 2.88   Cardiac Enzymes: Recent Labs  Lab 09/16/17 0300  TROPONINI 0.06*  BNP (last 3 results) No  results for input(s): PROBNP in the last 8760 hours. HbA1C: No results for input(s): HGBA1C in the last 72 hours. CBG: Recent Labs  Lab 09/20/17 2218  GLUCAP 157*   Lipid Profile: No results for input(s): CHOL, HDL, LDLCALC, TRIG, CHOLHDL, LDLDIRECT in the last 72 hours. Thyroid Function Tests: Recent Labs    09/20/17 0643  TSH 1.727   Anemia Panel: No results for input(s): VITAMINB12, FOLATE, FERRITIN, TIBC, IRON, RETICCTPCT in the last 72 hours. Sepsis Labs: Recent Labs  Lab 09/15/17 1652  LATICACIDVEN 1.2    Recent Results (from the past 240 hour(s))  Culture, expectorated sputum-assessment     Status: None   Collection Time: 09/14/17 10:34 PM  Result Value Ref Range Status   Specimen Description EXPECTORATED SPUTUM  Final   Special Requests Immunocompromised  Final   Sputum evaluation THIS SPECIMEN IS ACCEPTABLE FOR SPUTUM CULTURE  Final   Report Status 09/14/2017 FINAL  Final  Culture, respiratory (NON-Expectorated)     Status: None   Collection Time: 09/14/17 10:34 PM  Result Value Ref Range Status   Specimen Description EXPECTORATED SPUTUM  Final   Special Requests Immunocompromised Reflexed from [id number]  Final   Gram Stain   Final    ABUNDANT WBC PRESENT, PREDOMINANTLY PMN FEW SQUAMOUS EPITHELIAL CELLS PRESENT MODERATE YEAST WITH PSEUDOHYPHAE MODERATE GRAM POSITIVE RODS RARE GRAM NEGATIVE RODS Performed at [facility], [place] [address], [place], [place] [address]    Culture ABUNDANT CANDIDA ALBICANS  Final   Report Status 09/18/2017 FINAL  Final  Culture, blood (x 2)     Status: None   Collection Time: 09/15/17  9:48 AM  Result Value Ref Range Status   Specimen Description BLOOD RIGHT ANTECUBITAL  Final   Special Requests IN PEDIATRIC BOTTLE Blood Culture adequate volume  Final   Culture   Final    NO GROWTH 5 DAYS Performed at [facility], [place] [address], [place], [place] [address]    Report Status 09/20/2017 FINAL  Final  Culture, blood  (x 2)     Status: None   Collection Time: 09/15/17  9:48 AM  Result Value Ref Range Status   Specimen Description BLOOD BLOOD RIGHT HAND  Final   Special Requests IN PEDIATRIC BOTTLE Blood Culture adequate volume  Final   Culture   Final    NO GROWTH 5 DAYS Performed at [facility], [place] [address], [place], [place] [address]    Report Status 09/20/2017 FINAL  Final  C difficile quick scan w PCR reflex     Status: None   Collection Time: 09/15/17 11:42 AM  Result Value Ref Range Status   C Diff antigen NEGATIVE NEGATIVE Final   C Diff toxin NEGATIVE NEGATIVE Final   C Diff interpretation NEGATIVE  Final  Gastrointestinal Panel by PCR , Stool     Status: None   Collection Time: 09/15/17 11:42 AM  Result Value Ref Range Status   Campylobacter species NOT DETECTED NOT DETECTED Final   Plesimonas shigelloides NOT DETECTED NOT DETECTED Final   Salmonella species NOT DETECTED NOT DETECTED Final   Yersinia enterocolitica NOT DETECTED NOT DETECTED Final   Vibrio species NOT DETECTED NOT DETECTED Final   Vibrio cholerae NOT DETECTED NOT DETECTED Final   Enteroaggregative E coli (EAEC) NOT DETECTED NOT DETECTED Final   Enteropathogenic E coli (EPEC) NOT DETECTED NOT DETECTED Final   Enterotoxigenic E coli (ETEC) NOT DETECTED NOT DETECTED Final  Shiga like toxin producing E coli (STEC) NOT DETECTED NOT DETECTED Final   Shigella/Enteroinvasive E coli (EIEC) NOT DETECTED NOT DETECTED Final   Cryptosporidium NOT DETECTED NOT DETECTED Final   Cyclospora cayetanensis NOT DETECTED NOT DETECTED Final   Entamoeba histolytica NOT DETECTED NOT DETECTED Final   Giardia lamblia NOT DETECTED NOT DETECTED Final   Adenovirus F40/41 NOT DETECTED NOT DETECTED Final   Astrovirus NOT DETECTED NOT DETECTED Final   Norovirus GI/GII NOT DETECTED NOT DETECTED Final   Rotavirus A NOT DETECTED NOT DETECTED Final   Sapovirus (I, II, IV, and V) NOT DETECTED NOT DETECTED Final  Respiratory Panel by PCR      Status: None   Collection Time: 09/15/17  3:25 PM  Result Value Ref Range Status   Adenovirus NOT DETECTED NOT DETECTED Final   Coronavirus 229E NOT DETECTED NOT DETECTED Final   Coronavirus HKU1 NOT DETECTED NOT DETECTED Final   Coronavirus NL63 NOT DETECTED NOT DETECTED Final   Coronavirus OC43 NOT DETECTED NOT DETECTED Final   Metapneumovirus NOT DETECTED NOT DETECTED Final   Rhinovirus / Enterovirus NOT DETECTED NOT DETECTED Final   Influenza A NOT DETECTED NOT DETECTED Final   Influenza B NOT DETECTED NOT DETECTED Final   Parainfluenza Virus 1 NOT DETECTED NOT DETECTED Final   Parainfluenza Virus 2 NOT DETECTED NOT DETECTED Final   Parainfluenza Virus 3 NOT DETECTED NOT DETECTED Final   Parainfluenza Virus 4 NOT DETECTED NOT DETECTED Final   Respiratory Syncytial Virus NOT DETECTED NOT DETECTED Final   Bordetella pertussis NOT DETECTED NOT DETECTED Final   Chlamydophila pneumoniae NOT DETECTED NOT DETECTED Final   Mycoplasma pneumoniae NOT DETECTED NOT DETECTED Final    Comment: Performed at [facility], [address] [place], [place] [address]  MRSA PCR Screening     Status: None   Collection Time: 09/15/17  3:25 PM  Result Value Ref Range Status   MRSA by PCR NEGATIVE NEGATIVE Final    Comment:        The GeneXpert MRSA Assay (FDA approved for NASAL specimens only), is one component of a comprehensive MRSA colonization surveillance program. It is not intended to diagnose MRSA infection nor to guide or monitor treatment for MRSA infections.          Radiology Studies: No results found.      Scheduled Meds: . amoxicillin-clavulanate  1 tablet Oral BID  . chlorhexidine  15 mL Mouth Rinse BID  . Chlorhexidine Gluconate Cloth  6 each Topical Daily  . dorzolamide  1 drop Both Eyes BID   And  . timolol  1 drop Both Eyes BID  . famotidine  20 mg Oral QHS  . feeding supplement (ENSURE ENLIVE)  237 mL Oral BID BM  . furosemide  60 mg Intravenous BID  .  guaiFENesin  600 mg Oral BID  . ipratropium  0.5 mg Nebulization TID  . levalbuterol  1.25 mg Nebulization TID  . mouth rinse  15 mL Mouth Rinse q12n4p  . metoprolol tartrate  50 mg Oral BID  . multivitamin with minerals  1 tablet Oral Daily  . nystatin  5 mL Oral QID  . potassium chloride  40 mEq Oral Q4H  . sodium bicarbonate  650 mg Oral BID  . tamsulosin  0.4 mg Oral Daily  . warfarin  0.5 mg Oral ONCE-1800  . Warfarin - Pharmacist Dosing Inpatient   Does not apply q1800   Continuous Infusions:   LOS: 8 days  Time spent: over 30 minutes    Fayrene Helper, MD [facility] Pager [phone]  If 7PM-7AM, please contact night-coverage [email or web address] Password [email or web address] 09/22/2017, 11:30 AM

## 2017-09-22 NOTE — Progress Notes (Signed)
ANTICOAGULATION CONSULT NOTE   Pharmacy Consult for warfarin Indication: atrial fibrillation  No Known Allergies  Patient Measurements: Height: 6' 2.5" (189.2 cm) Weight: 264 lb 8.8 oz (120 kg) IBW/kg (Calculated) : 83.35 HEPARIN DW (KG): 111.6  Vital Signs: Temp: 98.1 F (36.7 C) (11/18 0418) Temp Source: Oral (11/18 0418) BP: 116/69 (11/18 0418) Pulse Rate: 103 (11/18 0418)  Labs: Recent Labs    09/20/17 0426 09/21/17 0454 09/22/17 0407  HGB 10.1* 10.1* 9.7*  HCT 31.7* 31.8* 31.0*  PLT 210 186 161  LABPROT 37.2* 34.1* 29.9*  INR 3.81 3.40 2.88  CREATININE 2.57* 2.33* 2.10*   Estimated Creatinine Clearance: 43.4 mL/min (A) (by C-G formula based on SCr of 2.1 mg/dL (H)).  Medications:   Scheduled:  . amoxicillin-clavulanate  1 tablet Oral BID  . chlorhexidine  15 mL Mouth Rinse BID  . Chlorhexidine Gluconate Cloth  6 each Topical Daily  . dorzolamide  1 drop Both Eyes BID   And  . timolol  1 drop Both Eyes BID  . famotidine  20 mg Oral QHS  . feeding supplement (ENSURE ENLIVE)  237 mL Oral BID BM  . furosemide  60 mg Intravenous BID  . guaiFENesin  600 mg Oral BID  . ipratropium  0.5 mg Nebulization TID  . levalbuterol  1.25 mg Nebulization TID  . mouth rinse  15 mL Mouth Rinse q12n4p  . metoprolol tartrate  50 mg Oral BID  . multivitamin with minerals  1 tablet Oral Daily  . nystatin  5 mL Oral QID  . sodium bicarbonate  650 mg Oral BID  . tamsulosin  0.4 mg Oral Daily  . Warfarin - Pharmacist Dosing Inpatient   Does not apply q1800   Assessment: 33 yoM readmitted 11/10 pm for worsening SOB, non-productive cough, LE edema. Prev admit 11/7-9 for hypoNa/AKI. Treating for HCAP and Afib.  Hx: Colon Ca, HTN, HPL Afib on Warfarin, LD 11/9, home dose 2mg  daily, was d/c on 1mg  daily  Today, 09/22/2017 - INR therapeutic today- warfarin restarted 11/13 with cautious one time low dose of 0.5mg , doses held 11/14-11/17. - Hgb low, stable. Platelets WNL. - No  bleeding/complications reported.  - Changed to regular diet 11/16- ate 100% of meal tray  - No major drug interactions   Goal of Therapy:  INR 2-3  Plan:  1) Warfarin 0.5mg  PO x1 today 2) Daily INR  Netta Cedars, PharmD, BCPS Pager: [phone] 09/22/2017 11:03 AM

## 2017-09-23 LAB — BASIC METABOLIC PANEL
ANION GAP: 8 (ref 5–15)
BUN: 25 mg/dL — ABNORMAL HIGH (ref 6–20)
CALCIUM: 8 mg/dL — AB (ref 8.9–10.3)
CO2: 28 mmol/L (ref 22–32)
Chloride: 103 mmol/L (ref 101–111)
Creatinine, Ser: 2 mg/dL — ABNORMAL HIGH (ref 0.61–1.24)
GFR, EST AFRICAN AMERICAN: 36 mL/min — AB (ref >60)
GFR, EST NON AFRICAN AMERICAN: 31 mL/min — AB (ref >60)
GLUCOSE: 125 mg/dL — AB (ref 65–99)
Potassium: 3.3 mmol/L — ABNORMAL LOW (ref 3.5–5.1)
Sodium: 139 mmol/L (ref 135–145)

## 2017-09-23 LAB — CBC
HCT: 31.1 % — ABNORMAL LOW (ref 39.0–52.0)
HEMOGLOBIN: 9.9 g/dL — AB (ref 13.0–17.0)
MCH: 25 pg — ABNORMAL LOW (ref 26.0–34.0)
MCHC: 31.8 g/dL (ref 30.0–36.0)
MCV: 78.5 fL (ref 78.0–100.0)
Platelets: 148 10*3/uL — ABNORMAL LOW (ref 150–400)
RBC: 3.96 MIL/uL — AB (ref 4.22–5.81)
RDW: 21.7 % — ABNORMAL HIGH (ref 11.5–15.5)
WBC: 9.5 10*3/uL (ref 4.0–10.5)

## 2017-09-23 LAB — MAGNESIUM: Magnesium: 1.9 mg/dL (ref 1.7–2.4)

## 2017-09-23 LAB — PROTIME-INR
INR: 2.6
PROTHROMBIN TIME: 27.7 s — AB (ref 11.4–15.2)

## 2017-09-23 MED ORDER — METOPROLOL TARTRATE 50 MG PO TABS
100.0000 mg | ORAL_TABLET | Freq: Two times a day (BID) | ORAL | Status: DC
  Administered 2017-09-23 – 2017-09-24 (×2): 100 mg via ORAL
  Filled 2017-09-23 (×2): qty 2

## 2017-09-23 MED ORDER — WARFARIN 0.5 MG HALF TABLET
0.5000 mg | ORAL_TABLET | Freq: Once | ORAL | Status: AC
  Administered 2017-09-23: 0.5 mg via ORAL
  Filled 2017-09-23: qty 1

## 2017-09-23 MED ORDER — POTASSIUM CHLORIDE CRYS ER 20 MEQ PO TBCR
40.0000 meq | EXTENDED_RELEASE_TABLET | ORAL | Status: AC
  Administered 2017-09-23 (×2): 40 meq via ORAL
  Filled 2017-09-23 (×2): qty 2

## 2017-09-23 MED ORDER — FUROSEMIDE 40 MG PO TABS
40.0000 mg | ORAL_TABLET | Freq: Two times a day (BID) | ORAL | Status: DC
  Administered 2017-09-23 – 2017-09-24 (×3): 40 mg via ORAL
  Filled 2017-09-23 (×3): qty 1

## 2017-09-23 NOTE — Progress Notes (Signed)
PROGRESS NOTE    [name]  YTK:[id number] DOB: [birth year] DOA: 09/14/2017 PCP: [name], MD   Brief Narrative: H/o afib on coumadin, H/o colon cancer (II and IIIb) s/p colectomy under adjuvent chemotherapy cycle 6, sent from infusion center due to hypotension, leukocytosis, diminished oral intake for 2 weeks. He was admitted to hospitalist service, then  Transferred to icu due to severe sepsis/pna/hypotension, afib/rvr. He received vanc/zosyn initially, due to wrosening of renal function, abx changed to zyvox and imipenem , plan for total of 7 days, last dose on 11/19.  Improving, transfer back to hospitalist service on 11/14  Assessment & Plan:   Active Problems:   Sepsis due to pneumonia ([place])   Pressure injury of skin   Sepsis/septic shock/pna/acute hypoxia: -CT chest demonstrates primarily mucus plugging and pneumonia , blood culture negative, strep pneumo antigen and leginella antigen negative.  -he required fluids resuscitation and neo-synephrine from 11/11 to 11/13,  -attempted US guided therapeutic and diagnostic thoracentesis on 11/12 aborted due to minimal fluids -- repeat ct chest on 11/15 due to persistent cough,  "1. Interval clearing of material within left mainstem bronchus. 2. Persistent left lower lobe pneumonia. 3. Mild right lower lobe atelectasis versus early lobar pneumonia. 4. Increased bilateral pleural effusions, left greater than right. 5. Loculated left pleural effusion along the major fissure." - vancomycin 11/10 - 11/12 - zosyn 11/10 - 11/12 - meropenem 11/12 - 11/16 - linezolid 11/12 - 11/16 - Augmentin 11/16 - 18 -he finished total of 7 days of broad spectrum abx with vanc/zosyn then linezolid and meropenem. abx change to oral augmentin for another three days.  -he did well with speech eval, now on regular diet. Continue diuresis, wean oxygen as tolerated, transfered to med tele on 11/16.  Generalized edema/pleural effusion:  -Likely  fluids overload from resuscitation -lasix 60 IV BID ordered -> transition to 40 PO BID today on 11/19 [ ]  initially ordered echo, but on review of Dr. Bonney Roussel note, repeat echo was not thought to change management.  Will cancel for now.   Afib/RVR -He was on cardizem drip, off heparin drip (was on heparin drip due to needing thoracentesis), restarted coumadin. -cardiology Dr Virgina Jock consulted. Detail please refer to consult note on 11/15. -improving - will uptitrate metop given persistent RVR to 100 mg bid  AKI: From sepsis? Renal US on 11/8 no obstruction Cr seems peaked at 3.65, cr improving with diuresis, renal dosing meds  Acute urinary retention: has to reinsert foley on 11/16 Check psa - elevated, will need f/u with urology flomax. Trial w/o foley today  Oral candidiasis:  Oral candidiasis probably caused Sputum + candida  Topical nystatin, improving Denies dysphagia  HTN/HLD: Admitted with sepsis  discontinue cardizem drip on 11/15, restart lopressor Continue titrate meds  Colon ca (II and IIIb) : -s/p colectomy under adjuvent chemotherapy cycle 5 with Folfox on 10/24, planned cycle 6 Folfox cancelled due to  Hospitalization. -oncology aware of hospitalization  Decondition:  FTT, SNF recommended   L>R LEE: prelim negative for DVT  DVT prophylaxis: warfarin Code Status: full  Family Communication: wife present Disposition Plan: pending improvement  Consultants:   Critical care, cards, oncology  Procedures: (Don't include imaging studies which can be auto populated. Include things that cannot be auto populated i.e. Echo, Carotid and venous dopplers, Foley, Bipap, HD, tubes/drains, wound vac, central lines etc) Aline placement on 11/11, 20 gaugecatheter was inserted into right radial arteryusing the Seldinger technique. Aline removed on 11/14  FFP  Transfusion on 11/12 Attempted thoracentesis on 11/12  Preliminary report:  There is no DVT  or SVT noted in the left lower extremity.  Antimicrobials: (specify start and planned stop date. Auto populated tables are space occupying and do not give end dates) Anti-infectives (From admission, onward)   Start     Dose/Rate Route Frequency Ordered Stop   09/20/17 1330  amoxicillin-clavulanate (AUGMENTIN) 500-125 MG per tablet 500 mg     1 tablet Oral 2 times daily 09/20/17 1233 09/22/17 2102   09/19/17 1400  meropenem (MERREM) 1 g in sodium chloride 0.9 % 100 mL IVPB  Status:  Discontinued     1 g 200 mL/hr over 30 Minutes Intravenous Every 12 hours 09/19/17 0933 09/20/17 1232   09/17/17 1400  meropenem (MERREM) 500 mg in sodium chloride 0.9 % 50 mL IVPB  Status:  Discontinued     500 mg 100 mL/hr over 30 Minutes Intravenous Every 12 hours 09/17/17 1146 09/19/17 0933   09/16/17 1400  linezolid (ZYVOX) 100 MG/5ML suspension 600 mg  Status:  Discontinued     600 mg Oral Every 12 hours 09/16/17 0939 09/16/17 0951   09/16/17 1400  meropenem (MERREM) 1 g in sodium chloride 0.9 % 100 mL IVPB  Status:  Discontinued     1 g 200 mL/hr over 30 Minutes Intravenous Every 12 hours 09/16/17 0947 09/17/17 1146   09/16/17 1000  linezolid (ZYVOX) IVPB 600 mg  Status:  Discontinued     600 mg 300 mL/hr over 60 Minutes Intravenous Every 12 hours 09/16/17 0938 09/16/17 0940   09/16/17 1000  linezolid (ZYVOX) IVPB 600 mg  Status:  Discontinued     600 mg 300 mL/hr over 60 Minutes Intravenous Every 12 hours 09/16/17 0951 09/20/17 1232   09/16/17 0630  vancomycin (VANCOCIN) 1,250 mg in sodium chloride 0.9 % 250 mL IVPB     1,250 mg 166.7 mL/hr over 90 Minutes Intravenous  Once 09/16/17 0622 09/16/17 0920   09/15/17 2200  vancomycin (VANCOCIN) 1,250 mg in sodium chloride 0.9 % 250 mL IVPB  Status:  Discontinued     1,250 mg 166.7 mL/hr over 90 Minutes Intravenous Every 12 hours 09/14/17 2115 09/15/17 1053   09/15/17 2200  vancomycin (VANCOCIN) IVPB 1000 mg/200 mL premix  Status:  Discontinued     1,000  mg 200 mL/hr over 60 Minutes Intravenous Every 24 hours 09/15/17 1053 09/15/17 1342   09/14/17 2300  piperacillin-tazobactam (ZOSYN) IVPB 3.375 g  Status:  Discontinued     3.375 g 12.5 mL/hr over 240 Minutes Intravenous Every 8 hours 09/14/17 2113 09/16/17 0936   09/14/17 1715  piperacillin-tazobactam (ZOSYN) IVPB 3.375 g     3.375 g 100 mL/hr over 30 Minutes Intravenous  Once 09/14/17 1708 09/14/17 1824   09/14/17 1715  vancomycin (VANCOCIN) 2,000 mg in sodium chloride 0.9 % 500 mL IVPB     2,000 mg 250 mL/hr over 120 Minutes Intravenous  Once 09/14/17 1711 09/14/17 2056        Subjective: Feeling better. Has DOE.  No pain.  Feels weak.  Objective: Vitals:   09/22/17 2000 09/23/17 0527 09/23/17 0729 09/23/17 0842  BP: 119/73 124/70    Pulse: (!) 114 (!) 107  (!) 132  Resp: 20 20    Temp: 98.1 F (36.7 C) 97.7 F (36.5 C)    TempSrc: Oral Oral    SpO2:  94% 93%   Weight:  120.3 kg (265 lb 3.4 oz)  Height:        Intake/Output Summary (Last 24 hours) at 09/23/2017 1202 Last data filed at 09/23/2017 0527 Gross per 24 hour  Intake -  Output 4200 ml  Net -4200 ml   Filed Weights   09/21/17 0133 09/22/17 0418 09/23/17 0527  Weight: 129 kg (284 lb 6.3 oz) 120 kg (264 lb 8.8 oz) 120.3 kg (265 lb 3.4 oz)    Examination:  General: No acute distress. Cardiovascular: Heart sounds show Jillyan Plitt regular rate, and rhythm. No gallops or rubs. No murmurs. No JVD. Lungs: Clear to auscultation bilaterally with good air movement. No rales, rhonchi or wheezes. Abdomen: Soft, nontender, nondistended with normal active bowel sounds. No masses. No hepatosplenomegaly. Neurological: Alert and oriented 3. Moves all extremities 4 with equal strength. Cranial nerves II through XII grossly intact. Skin: Warm and dry. No rashes or lesions. Extremities: No clubbing or cyanosis. 2+ LEE.  Psychiatric: Mood and affect are normal. Insight and judgment are appropriate.   Data Reviewed: I have  personally reviewed following labs and imaging studies  CBC: Recent Labs  Lab 09/17/17 0500 09/18/17 0313 09/19/17 0540 09/20/17 0426 09/21/17 0454 09/22/17 0407 09/23/17 0352  WBC 23.1* 23.0* 21.8* 17.6* 14.5* 11.7* 9.5  NEUTROABS 19.6* 19.0* 17.7*  --   --   --   --   HGB 9.0* 9.7* 10.2* 10.1* 10.1* 9.7* 9.9*  HCT 27.9* 30.0* 31.9* 31.7* 31.8* 31.0* 31.1*  MCV 77.9* 78.3 78.6 78.1 78.7 78.5 78.5  PLT 231 211 223 210 186 161 465*   Basic Metabolic Panel: Recent Labs  Lab 09/17/17 0500  09/18/17 0313 09/19/17 0540 09/20/17 0426 09/21/17 0454 09/21/17 1615 09/22/17 0407 09/23/17 0352  NA 143  --   --  136 143 141  --  140 139  K 3.2*  --   --  3.0* 3.1* 3.2*  --  3.2* 3.3*  CL 117*  --   --  109 112* 107  --  106 103  CO2 18*  --   --  19* 23 25  --  26 28  GLUCOSE 92  --   --  146* 114* 111*  --  112* 125*  BUN 36*  --   --  34* 34* 29*  --  28* 25*  CREATININE 3.65*   < > 3.32* 2.87* 2.57* 2.33*  --  2.10* 2.00*  CALCIUM 7.7*  --   --  8.0* 8.3* 8.1*  --  8.0* 8.0*  MG 1.7  --  1.7 1.9 1.8  --  1.5* 2.5* 1.9  PHOS 3.8  --  3.7 3.4  --   --   --   --   --    < > = values in this interval not displayed.   GFR: Estimated Creatinine Clearance: 45.7 mL/min (Claudio Mondry) (by C-G formula based on SCr of 2 mg/dL (H)). Liver Function Tests: Recent Labs  Lab 09/22/17 0407  ALBUMIN 1.8*   No results for input(s): LIPASE, AMYLASE in the last 168 hours. No results for input(s): AMMONIA in the last 168 hours. Coagulation Profile: Recent Labs  Lab 09/19/17 0540 09/20/17 0426 09/21/17 0454 09/22/17 0407 09/23/17 0352  INR 3.53 3.81 3.40 2.88 2.60   Cardiac Enzymes: No results for input(s): CKTOTAL, CKMB, CKMBINDEX, TROPONINI in the last 168 hours. BNP (last 3 results) No results for input(s): PROBNP in the last 8760 hours. HbA1C: No results for input(s): HGBA1C in the last 72 hours. CBG: Recent Labs  Lab 09/20/17 2218  GLUCAP 157*   Lipid Profile: No results for  input(s): CHOL, HDL, LDLCALC, TRIG, CHOLHDL, LDLDIRECT in the last 72 hours. Thyroid Function Tests: No results for input(s): TSH, T4TOTAL, FREET4, T3FREE, THYROIDAB in the last 72 hours. Anemia Panel: No results for input(s): VITAMINB12, FOLATE, FERRITIN, TIBC, IRON, RETICCTPCT in the last 72 hours. Sepsis Labs: No results for input(s): PROCALCITON, LATICACIDVEN in the last 168 hours.  Recent Results (from the past 240 hour(s))  Culture, expectorated sputum-assessment     Status: None   Collection Time: 09/14/17 10:34 PM  Result Value Ref Range Status   Specimen Description EXPECTORATED SPUTUM  Final   Special Requests Immunocompromised  Final   Sputum evaluation THIS SPECIMEN IS ACCEPTABLE FOR SPUTUM CULTURE  Final   Report Status 09/14/2017 FINAL  Final  Culture, respiratory (NON-Expectorated)     Status: None   Collection Time: 09/14/17 10:34 PM  Result Value Ref Range Status   Specimen Description EXPECTORATED SPUTUM  Final   Special Requests Immunocompromised Reflexed from [id number]  Final   Gram Stain   Final    ABUNDANT WBC PRESENT, PREDOMINANTLY PMN FEW SQUAMOUS EPITHELIAL CELLS PRESENT MODERATE YEAST WITH PSEUDOHYPHAE MODERATE GRAM POSITIVE RODS RARE GRAM NEGATIVE RODS Performed at [facility], [address], [place], [place] [address]    Culture ABUNDANT CANDIDA ALBICANS  Final   Report Status 09/18/2017 FINAL  Final  Culture, blood (x 2)     Status: None   Collection Time: 09/15/17  9:48 AM  Result Value Ref Range Status   Specimen Description BLOOD RIGHT ANTECUBITAL  Final   Special Requests IN PEDIATRIC BOTTLE Blood Culture adequate volume  Final   Culture   Final    NO GROWTH 5 DAYS Performed at [facility], [place] [address], [place], [place] [address]    Report Status 09/20/2017 FINAL  Final  Culture, blood (x 2)     Status: None   Collection Time: 09/15/17  9:48 AM  Result Value Ref Range Status   Specimen Description BLOOD BLOOD RIGHT HAND  Final    Special Requests IN PEDIATRIC BOTTLE Blood Culture adequate volume  Final   Culture   Final    NO GROWTH 5 DAYS Performed at [facility], [place] [address] [place], [place] [address]    Report Status 09/20/2017 FINAL  Final  C difficile quick scan w PCR reflex     Status: None   Collection Time: 09/15/17 11:42 AM  Result Value Ref Range Status   C Diff antigen NEGATIVE NEGATIVE Final   C Diff toxin NEGATIVE NEGATIVE Final   C Diff interpretation NEGATIVE  Final  Gastrointestinal Panel by PCR , Stool     Status: None   Collection Time: 09/15/17 11:42 AM  Result Value Ref Range Status   Campylobacter species NOT DETECTED NOT DETECTED Final   Plesimonas shigelloides NOT DETECTED NOT DETECTED Final   Salmonella species NOT DETECTED NOT DETECTED Final   Yersinia enterocolitica NOT DETECTED NOT DETECTED Final   Vibrio species NOT DETECTED NOT DETECTED Final   Vibrio cholerae NOT DETECTED NOT DETECTED Final   Enteroaggregative E coli (EAEC) NOT DETECTED NOT DETECTED Final   Enteropathogenic E coli (EPEC) NOT DETECTED NOT DETECTED Final   Enterotoxigenic E coli (ETEC) NOT DETECTED NOT DETECTED Final   Shiga like toxin producing E coli (STEC) NOT DETECTED NOT DETECTED Final   Shigella/Enteroinvasive E coli (EIEC) NOT DETECTED NOT DETECTED Final   Cryptosporidium NOT DETECTED NOT  DETECTED Final   Cyclospora cayetanensis NOT DETECTED NOT DETECTED Final   Entamoeba histolytica NOT DETECTED NOT DETECTED Final   Giardia lamblia NOT DETECTED NOT DETECTED Final   Adenovirus F40/41 NOT DETECTED NOT DETECTED Final   Astrovirus NOT DETECTED NOT DETECTED Final   Norovirus GI/GII NOT DETECTED NOT DETECTED Final   Rotavirus Shekina Cordell NOT DETECTED NOT DETECTED Final   Sapovirus (I, II, IV, and V) NOT DETECTED NOT DETECTED Final  Respiratory Panel by PCR     Status: None   Collection Time: 09/15/17  3:25 PM  Result Value Ref Range Status   Adenovirus NOT DETECTED NOT DETECTED Final   Coronavirus 229E  NOT DETECTED NOT DETECTED Final   Coronavirus HKU1 NOT DETECTED NOT DETECTED Final   Coronavirus NL63 NOT DETECTED NOT DETECTED Final   Coronavirus OC43 NOT DETECTED NOT DETECTED Final   Metapneumovirus NOT DETECTED NOT DETECTED Final   Rhinovirus / Enterovirus NOT DETECTED NOT DETECTED Final   Influenza Haliey Romberg NOT DETECTED NOT DETECTED Final   Influenza B NOT DETECTED NOT DETECTED Final   Parainfluenza Virus 1 NOT DETECTED NOT DETECTED Final   Parainfluenza Virus 2 NOT DETECTED NOT DETECTED Final   Parainfluenza Virus 3 NOT DETECTED NOT DETECTED Final   Parainfluenza Virus 4 NOT DETECTED NOT DETECTED Final   Respiratory Syncytial Virus NOT DETECTED NOT DETECTED Final   Bordetella pertussis NOT DETECTED NOT DETECTED Final   Chlamydophila pneumoniae NOT DETECTED NOT DETECTED Final   Mycoplasma pneumoniae NOT DETECTED NOT DETECTED Final    Comment: Performed at [facility], [place] [address] [place] [address], [place], [place] [address]  MRSA PCR Screening     Status: None   Collection Time: 09/15/17  3:25 PM  Result Value Ref Range Status   MRSA by PCR NEGATIVE NEGATIVE Final    Comment:        The GeneXpert MRSA Assay (FDA approved for NASAL specimens only), is one component of Takeria Marquina comprehensive MRSA colonization surveillance program. It is not intended to diagnose MRSA infection nor to guide or monitor treatment for MRSA infections.          Radiology Studies: No results found.      Scheduled Meds: . chlorhexidine  15 mL Mouth Rinse BID  . dorzolamide  1 drop Both Eyes BID   And  . timolol  1 drop Both Eyes BID  . famotidine  20 mg Oral QHS  . feeding supplement (ENSURE ENLIVE)  237 mL Oral BID BM  . furosemide  60 mg Intravenous BID  . furosemide  40 mg Oral BID  . guaiFENesin  600 mg Oral BID  . ipratropium  0.5 mg Nebulization TID  . levalbuterol  1.25 mg Nebulization TID  . mouth rinse  15 mL Mouth Rinse q12n4p  . metoprolol tartrate  75 mg Oral BID  . multivitamin with  minerals  1 tablet Oral Daily  . nystatin  5 mL Oral QID  . potassium chloride  40 mEq Oral Q4H  . sodium bicarbonate  650 mg Oral BID  . tamsulosin  0.4 mg Oral Daily  . Warfarin - Pharmacist Dosing Inpatient   Does not apply q1800   Continuous Infusions:   LOS: 9 days    Time spent: over 30 minutes    Fayrene Helper, MD [facility] Pager [phone]  If 7PM-7AM, please contact night-coverage [email or web address] Password [email or web address] 09/23/2017, 12:02 PM

## 2017-09-23 NOTE — Progress Notes (Addendum)
Physical Therapy Treatment Patient Details Name: [name] MRN: [id number] DOB: 03/29/1946 Today's Date: 09/23/2017    History of Present Illness 71 yo male admitted with ARF, hypokalemia, supratherapeutic INR. Hx of A fib, colon ca-on chemo, HTN. PNA, Sepsis and transfer to ICU.    PT Comments    Pt progressing with mobility, tolerated standing with RW for 3 trials of 15 + 20 + 40 seconds, tolerance limited by fatigue. Instructed pt in seated LE exercises to be done independently for strengthening. Will attempt ambulation next session.  Pt has declined SNF and plans to DC home, will need HHPT and a WC.    Follow Up Recommendations  Home health PT     Equipment Recommendations  Wheelchair (measurements PT);Wheelchair cushion (measurements PT)    Recommendations for Other Services       Precautions / Restrictions Precautions Precautions: Fall Restrictions Weight Bearing Restrictions: No    Mobility  Bed Mobility               General bed mobility comments: up in recliner  Transfers Overall transfer level: Needs assistance Equipment used: Rolling walker (2 wheeled) Transfers: Sit to/from Stand Sit to Stand: Min guard         General transfer comment: sit to stand x 3 from recliner with armrests, min/guard safety; pt stood for 15 + 20 + 40 seconds with RW, limited by fatigue. Performed marching in standing. SaO2 92% on RA with activity.   Ambulation/Gait             General Gait Details: deferred 2* fatigue with standing short periods   Stairs            Wheelchair Mobility    Modified Rankin (Stroke Patients Only)       Balance Overall balance assessment: Needs assistance Sitting-balance support: Feet supported Sitting balance-Leahy Scale: Good     Standing balance support: Bilateral upper extremity supported Standing balance-Leahy Scale: Poor Standing balance comment: requring RW currently, mildly unsteady initially upon  standing                            Cognition Arousal/Alertness: Awake/alert Behavior During Therapy: WFL for tasks assessed/performed Overall Cognitive Status: Within Functional Limits for tasks assessed                                        Exercises General Exercises - Lower Extremity Ankle Circles/Pumps: AROM;Both;15 reps;Seated Long Arc Quad: AROM;Both;20 reps;Seated Hip Flexion/Marching: AROM;Both;15 reps;Seated;Standing(x 15 seated, x 15 standing)    General Comments        Pertinent Vitals/Pain Pain Assessment: No/denies pain    Home Living                      Prior Function            PT Goals (current goals can now be found in the care plan section) Acute Rehab PT Goals Patient Stated Goal: to go home, finish chemo, travel to [place] PT Goal Formulation: With patient/family Time For Goal Achievement: 10/02/17 Potential to Achieve Goals: Good Progress towards PT goals: Progressing toward goals    Frequency    Min 3X/week      PT Plan Discharge plan needs to be updated(pt has chosen to DC home)    Co-evaluation  AM-PAC PT "6 Clicks" Daily Activity  Outcome Measure  Difficulty turning over in bed (including adjusting bedclothes, sheets and blankets)?: A Little Difficulty moving from lying on back to sitting on the side of the bed? : A Little Difficulty sitting down on and standing up from a chair with arms (e.g., wheelchair, bedside commode, etc,.)?: A Little Help needed moving to and from a bed to chair (including a wheelchair)?: A Little Help needed walking in hospital room?: Total Help needed climbing 3-5 steps with a railing? : Total 6 Click Score: 14    End of Session Equipment Utilized During Treatment: Gait belt Activity Tolerance: Patient tolerated treatment well Patient left: in chair;with call bell/phone within reach;with family/visitor present Nurse Communication: Mobility  status PT Visit Diagnosis: Muscle weakness (generalized) (M62.81);Difficulty in walking, not elsewhere classified (R26.2)     Time: 5498-2641 PT Time Calculation (min) (ACUTE ONLY): 28 min  Charges:  $Therapeutic Exercise: 8-22 mins $Therapeutic Activity: 8-22 mins                    G Codes:         Philomena Doheny 09/23/2017, 10:43 AM [phone]

## 2017-09-23 NOTE — Care Management Important Message (Signed)
Important Message  Patient Details  Name: QUASEAN FRYE MRN: [id number] Date of Birth: 09/21/1946   Medicare Important Message Given:  Yes    Kerin Salen 09/23/2017, 10:57 AMImportant Message  Patient Details  Name: STYLES FAMBRO MRN: [id number] Date of Birth: [birth year]   Medicare Important Message Given:  Yes    Kerin Salen 09/23/2017, 10:57 AM

## 2017-09-23 NOTE — Care Management Note (Signed)
Case Management Note  Patient Details  Name: [name] MRN: [id number] Date of Birth: 02-Sep-1946  Subjective/Objective: Spoke to patient about d/c plans-patient declines SNF-chose [facility] for HHC-HHRN/PT/OT/aide/csw, also w/c ordered-[facility] rep Santiago Glad aware & following. Already has rw,2n1.                    Action/Plan:d/c home w/HHC/dme   Expected Discharge Date:                  Expected Discharge Plan:  [place]  In-House Referral:     Discharge planning Services  CM Consult  Post Acute Care Choice:  Durable Medical Equipment([facility]-rw, 3n1) Choice offered to:  Patient  DME Arranged:    DME Agency:     HH Arranged:  RN, PT, OT, Nurse's Aide, Social Work [employer] Agency:     Status of Service:  In process, will continue to follow  If discussed at Long Length of Stay Meetings, dates discussed:    Additional Comments:  Dessa Phi, RN 09/23/2017, 2:56 PM

## 2017-09-23 NOTE — Progress Notes (Signed)
ANTICOAGULATION CONSULT NOTE   Pharmacy Consult for warfarin Indication: atrial fibrillation  No Known Allergies  Patient Measurements: Height: 6' 2.5" (189.2 cm) Weight: 265 lb 3.4 oz (120.3 kg) IBW/kg (Calculated) : 83.35 HEPARIN DW (KG): 111.6  Vital Signs: Temp: 97.7 F (36.5 C) (11/19 0527) Temp Source: Oral (11/19 0527) BP: 124/70 (11/19 0527) Pulse Rate: 132 (11/19 0842)  Labs: Recent Labs    09/21/17 0454 09/22/17 0407 09/23/17 0352  HGB 10.1* 9.7* 9.9*  HCT 31.8* 31.0* 31.1*  PLT 186 161 148*  LABPROT 34.1* 29.9* 27.7*  INR 3.40 2.88 2.60  CREATININE 2.33* 2.10* 2.00*   Estimated Creatinine Clearance: 45.7 mL/min (A) (by C-G formula based on SCr of 2 mg/dL (H)).  Medications:   Scheduled:  . chlorhexidine  15 mL Mouth Rinse BID  . dorzolamide  1 drop Both Eyes BID   And  . timolol  1 drop Both Eyes BID  . famotidine  20 mg Oral QHS  . feeding supplement (ENSURE ENLIVE)  237 mL Oral BID BM  . furosemide  60 mg Intravenous BID  . guaiFENesin  600 mg Oral BID  . ipratropium  0.5 mg Nebulization TID  . levalbuterol  1.25 mg Nebulization TID  . mouth rinse  15 mL Mouth Rinse q12n4p  . metoprolol tartrate  75 mg Oral BID  . multivitamin with minerals  1 tablet Oral Daily  . nystatin  5 mL Oral QID  . sodium bicarbonate  650 mg Oral BID  . tamsulosin  0.4 mg Oral Daily  . Warfarin - Pharmacist Dosing Inpatient   Does not apply q1800   Assessment: 30 yoM readmitted 11/10 pm for worsening SOB, non-productive cough, LE edema. Prev admit 11/7-9 for hypoNa/AKI. Treating for HCAP and Afib.  Hx: Colon Ca, HTN, HPL Afib on Warfarin, LD 11/9, home dose 2mg  daily, was d/c on 1mg  daily  Today, 09/23/2017 - INR therapeutic today- warfarin restarted 11/13, doses held 11/14-11/17. Given low dose warfarin 0.5mg  11/18 - Hgb low, stable. Platelets slightly low . - No bleeding/complications reported.  - Changed to regular diet 11/16- ate 100% of meal tray  - No major  drug interactions   Goal of Therapy:  INR 2-3  Plan:  1) Warfarin 0.5mg  PO x1 today 2) Daily INR  Doreene Eland, PharmD, BCPS.   Pager: [phone] 09/23/2017 12:01 PM

## 2017-09-23 NOTE — Progress Notes (Signed)
Patient suffers from weakness, fatigue, and decreased activity tolerance which impairs their ability to perform daily activities like ambulation in the home.  A walker alone will not resolve the issues with performing activities of daily living. A wheelchair will allow patient to safely perform daily activities.  The patient can self propel in the home or has a caregiver who can provide assistance.     Blondell Reveal Kistler PT 09/23/2017  [phone]

## 2017-09-23 NOTE — Progress Notes (Signed)
CSW spoke with patient/patient's wife at bedside and provided bed offer. Patient reported that he plans to discharge home with his wife. Patient's wife reported that the SNF that made bed offer is to far. Patient's wife reported that patient is agreeable to HHPT, RNCM updated. Patient to discharge home, [place] signing off no other needs identified at this time.  Abundio Miu, [place] Social Worker [facility] Cell#: [phone]

## 2017-09-24 LAB — BASIC METABOLIC PANEL
Anion gap: 8 (ref 5–15)
BUN: 22 mg/dL — ABNORMAL HIGH (ref 6–20)
CHLORIDE: 102 mmol/L (ref 101–111)
CO2: 29 mmol/L (ref 22–32)
CREATININE: 1.83 mg/dL — AB (ref 0.61–1.24)
Calcium: 8 mg/dL — ABNORMAL LOW (ref 8.9–10.3)
GFR calc non Af Amer: 35 mL/min — ABNORMAL LOW (ref >60)
GFR, EST AFRICAN AMERICAN: 41 mL/min — AB (ref >60)
Glucose, Bld: 123 mg/dL — ABNORMAL HIGH (ref 65–99)
POTASSIUM: 3.5 mmol/L (ref 3.5–5.1)
Sodium: 139 mmol/L (ref 135–145)

## 2017-09-24 LAB — PROTIME-INR
INR: 2.29
Prothrombin Time: 25 seconds — ABNORMAL HIGH (ref 11.4–15.2)

## 2017-09-24 LAB — CBC
HCT: 30.9 % — ABNORMAL LOW (ref 39.0–52.0)
Hemoglobin: 9.8 g/dL — ABNORMAL LOW (ref 13.0–17.0)
MCH: 25.2 pg — AB (ref 26.0–34.0)
MCHC: 31.7 g/dL (ref 30.0–36.0)
MCV: 79.4 fL (ref 78.0–100.0)
PLATELETS: 139 10*3/uL — AB (ref 150–400)
RBC: 3.89 MIL/uL — AB (ref 4.22–5.81)
RDW: 21.7 % — AB (ref 11.5–15.5)
WBC: 8.6 10*3/uL (ref 4.0–10.5)

## 2017-09-24 MED ORDER — WARFARIN SODIUM 1 MG PO TABS
1.0000 mg | ORAL_TABLET | Freq: Once | ORAL | Status: DC
  Filled 2017-09-24: qty 1

## 2017-09-24 MED ORDER — ADULT MULTIVITAMIN W/MINERALS CH: 1.0000 | ORAL_TABLET | Freq: Every day | ORAL | 0 refills | Status: AC

## 2017-09-24 MED ORDER — FUROSEMIDE 40 MG PO TABS: 40.0000 mg | ORAL_TABLET | Freq: Two times a day (BID) | ORAL | 0 refills | Status: AC

## 2017-09-24 MED ORDER — HEPARIN SOD (PORK) LOCK FLUSH 100 UNIT/ML IV SOLN
500.0000 [IU] | INTRAVENOUS | Status: AC | PRN
  Administered 2017-09-24: 500 [IU]

## 2017-09-24 MED ORDER — POTASSIUM CHLORIDE CRYS ER 20 MEQ PO TBCR: 40.0000 meq | EXTENDED_RELEASE_TABLET | Freq: Every day | ORAL | 0 refills | Status: AC

## 2017-09-24 MED ORDER — METOPROLOL TARTRATE 100 MG PO TABS: 100.0000 mg | ORAL_TABLET | Freq: Two times a day (BID) | ORAL | 0 refills | Status: AC

## 2017-09-24 NOTE — Progress Notes (Signed)
    Durable Medical Equipment  (From admission, onward)        Start     Ordered   09/24/17 1038  For home use only DME lightweight manual wheelchair with seat cushion  (Wheelchairs)  Once    Comments:  Patient suffers from weakness, fatigue, decreased activity tolerance which impairs their ability to perform daily activities like ambulation in the home.  A walker will not resolve  issue with performing activities of daily living. A wheelchair will allow patient to safely perform daily activities. Patient can self propel in the lightweight wheelchair.  Accessories: elevating leg rests (ELRs), wheel locks, extensions and anti-tippers.   09/24/17 1042

## 2017-09-24 NOTE — Progress Notes (Signed)
Nutrition Follow-up  DOCUMENTATION CODES:   Obesity unspecified  INTERVENTION:  - Will d/c Ensure per pt request.  - Continue to encourage PO intakes.  - Will communicate pt information to [facility] RD.   NUTRITION DIAGNOSIS:   Increased nutrient needs related to catabolic illness, cancer and cancer related treatments as evidenced by estimated needs. -ongoing  GOAL:   Patient will meet greater than or equal to 90% of their needs -unmet  MONITOR:   PO intake, Weight trends, Labs, Skin  ASSESSMENT:   71 year old male with colon cancer undergoing chemotherapy. He initially presented to the oncology clinic on 11/7 with diminished P.O. intake for 2 weeks and noticed to be hyponatremic, hypokalemic, and with a white count greater than 23,000. Chemotherapy was held d/t dehydration and anorexia was suspected and IV fluid and electrolyte replacement was started. He re-presented on 11/10 d/t developing congested, cough, and weakness. This time he was admitted and upon admission it was felt that he might be in pulmonary congestion. Found to have PNA and sepsis and he was given Lasix and also broad-spectrum antibiotics and placed on Cardizem drip for atrial fibrillation.  11/20 Pt reports that over the past week his appetite has been "awful, awful, awful." He reports that he is lucky to consume 300-400 kcal/day and that most items taste like cardboard or have no taste. Pt and wife, who is at bedside, report that juice, especially orange juice, has some flavor and that [place] ices had been tolerated well because of this also. Pt has not tried pasta sauce but states he was planning to try it today when he gets home. Pt and wife asking RD for suggestions. Based on discussion, encouraged pt and wife to add spices to foods (encouraged items that, at baseline, pt may have found to be too spicy) also encouraged adding acid whenever possible, such as marinating meats in items such as lemon or orange juice. Pt  and wife very appreciative of suggestions.  Pt states he is unable to drink Ensure because of the taste. Wife reports having protein bars and protein shakes at home that pt was doing well with prior to hospitalization. Wife states they were informed that pt will go home today.  Noted pt with thrush. He and wife report that this is a recurrent problem and that it is improving at this time for this episode.   Medications reviewed; 20 mg oral Pepcid/day, 40 mg oral Lasix BID, daily multivitamin with minerals, 5 mL Mycostatin QID, 40 mEq oral KCl x2 doses yesterday, 650 mg oral sodium bicarb BID. Labs reviewed; BUN: 22 mg/dL, creatinine: 1.83 mg/dL, Ca: 8 mg/dL, GFR: 35 mL/min.      11/13 - Pt drowsy during this RD visit.  - He reports that he was sleeping during breakfast and that ice cream and Sprite (which he was slowly consuming during RD visit) was his lunch. -  Reports that he has had a decreased appetite and that "it's a long story."  - He confirms decreased appetite with taste alterations (no taste, bland taste) since starting chemo and states that today is 3 weeks since his last treatment.  - He does indicate that he needed to miss a treatment schedule within those 3 weeks d/t low WBC count.  - This afternoon appetite is slightly better than it has been recently.  - He reports that yesterday he was so sick and weak that he was unable to talk.  - Pt's voice sounded somewhat hoarse. - Pt was appreciative  when RD mentioned plan to stop by another time so that he can rest/rest his voice at this time.  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  - Per chart review, pt weight has mainly been stable (280-285 lbs) over the past 2 months.  - Based on current weight, he has lost 6 lbs (2% body weight) in the past 3 months. This is not significant for time frame. - Pt does not meet criteria for malnutrition at this  time based on ASPEN criteria for malnutrition, but he is at high risk.        Diet Order:  Diet regular Room service appropriate? Yes; Fluid consistency: Thin  EDUCATION NEEDS:   Not appropriate for education at this time  Skin:  Skin Assessment: Skin Integrity Issues: Skin Integrity Issues:: Stage I Stage I: L buttocks  Last BM:  11/19  Height:   Ht Readings from Last 1 Encounters:  09/21/17 6' 2.5" (1.892 m)    Weight:   Wt Readings from Last 1 Encounters:  09/24/17 256 lb 6.3 oz (116.3 kg)    Ideal Body Weight:  86.36 kg  BMI:  Body mass index is 32.48 kg/m.  Estimated Nutritional Needs:   Kcal:  2100-2335 (18-20 kcal/kg)  Protein:  117-128 grams (1-1.1 grams/kg)  Fluid:  >/= 2.3 L/day      Jarome Matin, MS, RD, LDN, [facility] Inpatient Clinical Dietitian Pager # [phone] After hours/weekend pager # [phone]

## 2017-09-24 NOTE — Discharge Summary (Signed)
Physician Discharge Summary  [name]:[id number] DOB: 05-27-1946 DOA: 09/14/2017  PCP: [name], MD  Admit date: 09/14/2017 Discharge date: 09/24/2017  Time spent: over 30 minutes  Recommendations for Outpatient Follow-up:  1. Follow up outpatient INR, CMP/CBC.  Adjust warfarin, lasix, potassium as indicated. 2. Follow up with outpatient cardiology, PCP, and oncology 3. Patient with pleural effusion with aborted thoracentesis on 11/12.  Repeat CT with pleural effusions and loculated L pleural effusion along major fissure (seen on initial CT as well).  Would consider repeat imaging to follow effusions after diuresis and treatment of pneumonia.  4. Consider repeat echo as outpatient with overload while inpatient, continue to monitor weights and volume status 5. Continue to adjust meds for rate control (discharged with metoprolol 100 mg BID which is different than his previous home regimen). 6. Follow up elevated PSA, will need urology follow up  Discharge Diagnoses:  Active Problems:   Sepsis due to pneumonia ([place])   Pressure injury of skin   Discharge Condition: stable  Diet recommendation: heart healthy  Filed Weights   09/22/17 0418 09/23/17 0527 09/24/17 0504  Weight: 120 kg (264 lb 8.8 oz) 120.3 kg (265 lb 3.4 oz) 116.3 kg (256 lb 6.3 oz)    History of present illness:  H/o afib on coumadin, H/o colon cancer (II and IIIb) s/p colectomy under adjuvent chemotherapy cycle 6, sent from infusion center due to hypotension, leukocytosis, diminished oral intake for 2 weeks. He was admitted to hospitalist service, then Transferred to icu due to severe sepsis/pna/hypotension, afib/rvr. He received vanc/zosyn initially, due to wrosening of renal function, abx changed to zyvox and imipenem , plan for total of 7 days, last dose on 11/19.  Improving, transfer back to hospitalist service on 11/14  Hospital Course:  Sepsis/septic shock/pna/acute hypoxia: Had negative blood  cultures, strep pneumonia antigen, and legionella antigen. Patient initially required care in ICU with pressors and IVF.  He was treated with broad spectrum antibiotics with vanc/zosyn from 11/10-11/12 and then with meropenem/linezolid from 11/12-11/16.  He was given augmentin from 11/16-11/18.  He improved with antibiotics and then diuresis.  He had an attempted thoracentesis on 11/12 that was aborted due to minimal fluids.  On 11/15 he had [name] repeat CT with persistent LLL pneumonia as well as RLL atelectasis (vs early pna) and bilateral pleural effusions and loculated fluid "again noted along L major fissure".  He was diuresed and improved until the day of discharge.   Generalized edema/pleural effusion:  -Likely fluids overload from resuscitation -lasix 60 IV BID ordered -> transition to 40 PO BID today on 11/19 and discharged with this dose. [ ]  initially ordered echo, but on review of Dr. Bonney Roussel note, repeat echo was not thought to change management.  Will cancel for now.   Afib/RVR - Required cardizem and heparin gtt.  Discharged on coumadin and metoprolol 100 mg BID (HR in 90's - low 100's prior to discharge).  Will need continued monitoring and adjustment of meds after discharge.  -cardiology Dr Virgina Jock consulted.Detail please refer to consult note on 11/15.  AKI: Improving on discharge Renal US on 11/8 no obstruction Cr seems peaked at 3.65, cr improving with diuresis, renal dosing meds  Acute urinary retention:  Resolved on discharge Elevated PSA, will need urology follow up  Oral candidiasis:  Oral candidiasis probably caused Sputum + candida  Topical nystatin, improving Denies dysphagia  HTN/HLD: Discharged with metoprolol, atorvastatin  Colon ca (II and IIIb) : -s/p colectomy under adjuvent chemotherapy  cycle 5 with Folfox on 10/24, planned cycle 6 Folfox cancelled due to Hospitalization. -oncology aware of hospitalization  Decondition: FTT, SNF  recommended   L>R LEE: prelim negative for DVT  Procedures: 11/17 Right: There is no evidence of deep vein thrombosis in the lower extremity.There is no evidence of superficial venous thrombosis.  Consultations:  PCCM, cardiology, oncology  Anti-infectives (From admission, onward)   Start     Dose/Rate Route Frequency Ordered Stop   09/20/17 1330  amoxicillin-clavulanate (AUGMENTIN) 500-125 MG per tablet 500 mg     1 tablet Oral 2 times daily 09/20/17 1233 09/22/17 2102   09/19/17 1400  meropenem (MERREM) 1 g in sodium chloride 0.9 % 100 mL IVPB  Status:  Discontinued     1 g 200 mL/hr over 30 Minutes Intravenous Every 12 hours 09/19/17 0933 09/20/17 1232   09/17/17 1400  meropenem (MERREM) 500 mg in sodium chloride 0.9 % 50 mL IVPB  Status:  Discontinued     500 mg 100 mL/hr over 30 Minutes Intravenous Every 12 hours 09/17/17 1146 09/19/17 0933   09/16/17 1400  linezolid (ZYVOX) 100 MG/5ML suspension 600 mg  Status:  Discontinued     600 mg Oral Every 12 hours 09/16/17 0939 09/16/17 0951   09/16/17 1400  meropenem (MERREM) 1 g in sodium chloride 0.9 % 100 mL IVPB  Status:  Discontinued     1 g 200 mL/hr over 30 Minutes Intravenous Every 12 hours 09/16/17 0947 09/17/17 1146   09/16/17 1000  linezolid (ZYVOX) IVPB 600 mg  Status:  Discontinued     600 mg 300 mL/hr over 60 Minutes Intravenous Every 12 hours 09/16/17 0938 09/16/17 0940   09/16/17 1000  linezolid (ZYVOX) IVPB 600 mg  Status:  Discontinued     600 mg 300 mL/hr over 60 Minutes Intravenous Every 12 hours 09/16/17 0951 09/20/17 1232   09/16/17 0630  vancomycin (VANCOCIN) 1,250 mg in sodium chloride 0.9 % 250 mL IVPB     1,250 mg 166.7 mL/hr over 90 Minutes Intravenous  Once 09/16/17 0622 09/16/17 0920   09/15/17 2200  vancomycin (VANCOCIN) 1,250 mg in sodium chloride 0.9 % 250 mL IVPB  Status:  Discontinued     1,250 mg 166.7 mL/hr over 90 Minutes Intravenous Every 12 hours 09/14/17 2115 09/15/17 1053   09/15/17 2200   vancomycin (VANCOCIN) IVPB 1000 mg/200 mL premix  Status:  Discontinued     1,000 mg 200 mL/hr over 60 Minutes Intravenous Every 24 hours 09/15/17 1053 09/15/17 1342   09/14/17 2300  piperacillin-tazobactam (ZOSYN) IVPB 3.375 g  Status:  Discontinued     3.375 g 12.5 mL/hr over 240 Minutes Intravenous Every 8 hours 09/14/17 2113 09/16/17 0936   09/14/17 1715  piperacillin-tazobactam (ZOSYN) IVPB 3.375 g     3.375 g 100 mL/hr over 30 Minutes Intravenous  Once 09/14/17 1708 09/14/17 1824   09/14/17 1715  vancomycin (VANCOCIN) 2,000 mg in sodium chloride 0.9 % 500 mL IVPB     2,000 mg 250 mL/hr over 120 Minutes Intravenous  Once 09/14/17 1711 09/14/17 2056      Discharge Exam: Vitals:   09/24/17 0504 09/24/17 0743  BP: 121/60   Pulse: 79   Resp: 18   Temp: 98.9 F (37.2 C)   SpO2: 93% 92%   Feels ready to go home.  Still SOB with exertion, but feeling better.  General: No acute distress.  Working out in chair with bands. Cardiovascular: Heart sounds show [name] regular rate, and  rhythm. No gallops or rubs. No murmurs. No JVD. Lungs: Clear to auscultation bilaterally with good air movement. No rales, rhonchi or wheezes. Abdomen: Soft, nontender, nondistended with normal active bowel sounds. No masses. No hepatosplenomegaly. Neurological: Alert and oriented 3. Moves all extremities 4 with equal strength. Cranial nerves II through XII grossly intact. Skin: Warm and dry. No rashes or lesions. Extremities: Improving LEE Psychiatric: Mood and affect are normal. Insight and judgment are appropriate.  Discharge Instructions   Discharge Instructions    Call MD for:  difficulty breathing, headache or visual disturbances   Complete by:  As directed    Call MD for:  persistant dizziness or light-headedness   Complete by:  As directed    Call MD for:  persistant nausea and vomiting   Complete by:  As directed    Call MD for:  severe uncontrolled pain   Complete by:  As directed    Call  MD for:  temperature >100.4   Complete by:  As directed    Diet - low sodium heart healthy   Complete by:  As directed    Diet - low sodium heart healthy   Complete by:  As directed    Discharge instructions   Complete by:  As directed    You were diagnosed with pneumonia.  You improved with antibiotics.  We have started you on lasix because of all the fluid you had on you.  We need to check you labs in the next 2 days to ensure you still need to take your lasix at this current dose.  Please reduce your potassium to 1 pill daily until you follow up with [facility] lab check as well.  We changed your medicine for your afib to metoprolol 100 mg twice daily and have stopped your diltiazem.  Please follow up with your cardiologist.  They add back the diltiazem or make changes to your medications based on your heart rate.  We've stopped your lisinopril until your kidney function improves Jolie Strohecker bit more.  We need to check your INR for your warfarin within the next few days as well.  Kariem Wolfson nurse should come out to check your labs as well.  Return with new, worsening, or recurrent symptoms.   Increase activity slowly   Complete by:  As directed    Increase activity slowly   Complete by:  As directed      Discharge Medication List as of 09/24/2017  3:31 PM    START taking these medications   Details  furosemide (LASIX) 40 MG tablet Take 1 tablet (40 mg total) by mouth 2 (two) times daily., Starting Tue 09/24/2017, Until Thu 10/24/2017, Normal    Multiple Vitamin (MULTIVITAMIN WITH MINERALS) TABS tablet Take 1 tablet by mouth daily., Starting Wed 09/25/2017, Until Fri 10/25/2017, Print      CONTINUE these medications which have CHANGED   Details  metoprolol tartrate (LOPRESSOR) 100 MG tablet Take 1 tablet (100 mg total) by mouth 2 (two) times daily., Starting Tue 09/24/2017, Until Thu 10/24/2017, Normal    potassium chloride SA (K-DUR,KLOR-CON) 20 MEQ tablet Take 2 tablets (40 mEq total) by mouth daily., Starting  Tue 09/24/2017, Normal      CONTINUE these medications which have NOT CHANGED   Details  ALPRAZolam (XANAX) 0.5 MG tablet Take 0.5 mg by mouth 3 (three) times daily as needed for anxiety. , Starting Fri 05/01/2013, Historical Med    atorvastatin (LIPITOR) 80 MG tablet Take 80 mg by mouth every evening. ,  Starting Thu 04/16/2013, Historical Med    dorzolamidel-timolol (COSOPT) 22.3-6.8 MG/ML SOLN ophthalmic solution Place 1 drop into both eyes 2 (two) times daily., Historical Med    lidocaine-prilocaine (EMLA) cream Apply to port site one hour prior to use. Do not rub in. Cover with plastic., Normal    loperamide (IMODIUM) 2 MG capsule Take 1 capsule (2 mg total) every 6 (six) hours as needed by mouth for diarrhea or loose stools., Starting Fri 09/13/2017, Normal    nystatin (MYCOSTATIN) 100000 UNIT/ML suspension Take 5 mLs by mouth 4 (four) times daily as needed (for oral thrush). , Historical Med    ondansetron (ZOFRAN) 8 MG tablet Take 1 tablet (8 mg total) every 8 (eight) hours as needed by mouth for nausea or vomiting., Starting Mon 09/09/2017, Normal    prochlorperazine (COMPAZINE) 10 MG tablet Take 1 tablet (10 mg total) by mouth every 6 (six) hours as needed for nausea or vomiting., Starting Fri 06/28/2017, Normal    warfarin (COUMADIN) 1 MG tablet Take 1 tablet (1 mg total) daily at 6 PM by mouth., Starting Fri 09/13/2017, Normal      STOP taking these medications     diltiazem (CARDIZEM CD) 300 MG 24 hr capsule      lisinopril (PRINIVIL,ZESTRIL) 10 MG tablet        No Known Allergies Follow-up Information    [name], MD Follow up.   Specialty:  Internal Medicine Contact information: [place] [address] [phone]        Adrian Prows, MD Follow up.   Specialty:  Cardiology Contact information: [place] [address] [phone]        [facility] [place] Follow up.   Specialty:  Home  Health Services Why:  [facility] nursing/physical/occupational,aide,social worker Contact information: [address] [place] [address] [phone]        [place] Follow up.   Why:  wheelchair Contact information: [address] [place] [address] [phone]            The results of significant diagnostics from this hospitalization (including imaging, microbiology, ancillary and laboratory) are listed below for reference.    Significant Diagnostic Studies: Ct Chest Wo Contrast  Result Date: 09/19/2017 CLINICAL DATA:  Cough, persistent. EXAM: CT CHEST WITHOUT CONTRAST TECHNIQUE: Multidetector CT imaging of the chest was performed following the standard protocol without IV contrast. COMPARISON:  CT chest 09/16/2017 FINDINGS: Cardiovascular: Heart is enlarged. Coronary artery calcifications are again noted. Atherosclerotic calcifications are present along the undersurface of the arch. There is no aneurysm. Pulmonary arteries are normal in size. Mediastinum/Nodes: No significant mediastinal or axillary adenopathy is present. Secretions previously noted in the left mainstem bronchus and proximal lobar arteries has resolved. The esophagus is mildly dilated. [name] small hiatal hernia is present. Lungs/Pleura: Left greater than right medial lower lobe airspace opacities are again seen. There is fluid or soft tissue in the posteromedial basilar segmental bronchi. Peribronchial thickening is seen posteriorly in the lingula and in the right middle lobe. Bilateral pleural effusions have increased, left greater than right. Loculated fluid is again noted along the left major fissure. Upper Abdomen: Layering density suggests small gallstones. There is no inflammatory change of the gallbladder. The visualized liver is within normal limits. Visualized solid organs are otherwise unremarkable. 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  IMPRESSION: 1. Interval clearing of material within left mainstem bronchus.  2. Persistent left lower lobe pneumonia. 3. Mild right lower lobe atelectasis versus early lobar pneumonia. 4. Increased bilateral pleural effusions, left greater than right. 5. Loculated left pleural effusion along the major fissure. 6. Coronary artery disease. 7.  Aortic Atherosclerosis (ICD10-I70.0). Electronically Signed   By: San Morelle M.D.   On: 09/19/2017 11:50   Ct Chest Wo Contrast  Result Date: 09/16/2017 CLINICAL DATA:  Patient is undergoing chemotherapy for colon carcinoma. More recently, patient hospitalized for cough, congestion and weakness. EXAM: CT CHEST WITHOUT CONTRAST TECHNIQUE: Multidetector CT imaging of the chest was performed following the standard protocol without IV contrast. COMPARISON:  Chest radiograph, 09/15/2017.  Chest CT, 04/25/2017. FINDINGS: Cardiovascular: Heart top-normal in size. There are mild left coronary artery calcifications. Great vessels normal in caliber. Mild atherosclerotic calcifications noted along the aortic arch and descending thoracic aorta. Mediastinum/Nodes: There are scattered subcentimeter shotty mediastinal lymph nodes. These are similar to the prior CT. No neck base or axillary masses or adenopathy. No mediastinal or hilar masses. The trachea is patent. There is material within the left mainstem bronchus extending to the Lower and upper lobe bronchi. This is most likely mucus. Lungs/Pleura: Small left and minimal right pleural effusions. Partial left lower lobe atelectasis. There is hazy airspace opacity in the central left upper lobe in [name] peribronchovascular distribution, with associated linear type opacities. [name] component of infection is suspected, associated with atelectasis. Some pleural fluid tracks along the left oblique fissure. There is milder dependent opacity in the right lower lobe consistent with atelectasis. Remainder of the right lung is clear. There is  no evidence of pulmonary edema. No pneumothorax. Upper Abdomen: No acute abnormality. Musculoskeletal: No fracture or acute finding. No osteoblastic or osteolytic lesions. IMPRESSION: 1. There is material in the left mainstem bronchus extending into the upper and lower lobe bronchi. This is most likely mucous plugging. It leads to partial atelectasis of the left lower lobe with associated volume loss. 2. Hazy type airspace opacities are seen in the central left upper lobe associated with linear opacities. This is likely [name] combination of infection and subsegmental atelectasis. 3. Mild dependent atelectasis in the right lower lobe. 4. Small left and minimal right pleural effusions. 5. Mild coronary artery calcifications. Mild aortic atherosclerosis. Aortic Atherosclerosis (ICD10-I70.0). Electronically Signed   By: Lajean Manes M.D.   On: 09/16/2017 16:33   US Renal  Result Date: 09/12/2017 CLINICAL DATA:  Renal failure. EXAM: RENAL / URINARY TRACT ULTRASOUND COMPLETE COMPARISON:  None. FINDINGS: Right Kidney: Length: 12.7 cm. Normal echotexture. No hydronephrosis. 3.4 cm and 2.8 cm exophytic cysts. Left Kidney: Length: 12.6 cm. Normal echotexture. Diffuse cortical thinning and prominent renal sinus fat. No hydronephrosis. Bladder: Appears normal for degree of bladder distention. IMPRESSION: 1. Diffuse left renal cortical thinning and prominent renal sinus fat. 2. No hydronephrosis or other acute abnormality. Electronically Signed   By: Claudie Revering M.D.   On: 09/12/2017 13:27   Dg Chest Port 1 View  Result Date: 09/16/2017 CLINICAL DATA:  71 y/o  M; respiratory distress. EXAM: PORTABLE CHEST 1 VIEW COMPARISON:  09/15/2017 chest radiograph. FINDINGS: Large increased left pleural effusion and worsening atelectasis of left lung. Clear right lung. Cardiac silhouette obscured by left diffusion. Left central venous catheter tip projects over mid S be seen. No acute osseous abnormality is evident. IMPRESSION:  Increased large left pleural effusion and worsening atelectasis of left lung. Clear right lung. Electronically Signed   By: Kristine Garbe M.D.   On: 09/16/2017 01:34  Dg Chest Port 1 View  Result Date: 09/15/2017 CLINICAL DATA:  Shortness of Breath EXAM: PORTABLE CHEST 1 VIEW COMPARISON:  September 14, 2017 FINDINGS: There is airspace consolidation in the left lower lobe with small left pleural effusion. Right lung is clear. Heart is mildly enlarged with pulmonary vascularity within normal limits. No adenopathy. [place]-Cath tip is in superior vena cava. There is aortic atherosclerosis. No bone lesions. IMPRESSION: Airspace consolidation consistent with pneumonia left base, increased from 1 day prior. Small left pleural effusion. Right lung clear. Stable cardiac prominence.  There is aortic atherosclerosis. Aortic Atherosclerosis (ICD10-I70.0). Electronically Signed   By: Lowella Grip III M.D.   On: 09/15/2017 09:48   Dg Chest Portable 1 View  Result Date: 09/14/2017 CLINICAL DATA:  Weakness and productive cough. EXAM: PORTABLE CHEST 1 VIEW COMPARISON:  09/11/2017 FINDINGS: Injectable port in stable position. Enlarged cardiac silhouette. Calcific atherosclerotic disease of the aorta. There is no evidence of pleural effusion or pneumothorax. Patchy lower lobe predominant peribronchial airspace opacities. Osseous structures are without acute abnormality. Soft tissues are grossly normal. IMPRESSION: Patchy lower lobe predominant peribronchial airspace opacities may represent asymmetry pulmonary edema or developing bronchopneumonia. Electronically Signed   By: Fidela Salisbury M.D.   On: 09/14/2017 17:04   Dg Chest Port 1 View  Result Date: 09/11/2017 CLINICAL DATA:  Leukocytosis.  Hypertension.  Pneumonia. EXAM: PORTABLE CHEST 1 VIEW COMPARISON:  07/02/2017 FINDINGS: Left central venous catheter with tip over the mid SVC region. No pneumothorax. Shallow inspiration with segmental  elevation of the right hemidiaphragm. Blunting of the left costophrenic angle may indicate fluid or thickened pleura. No airspace disease or consolidation in the lungs. Mild cardiac enlargement without vascular congestion. Calcification of the aorta. IMPRESSION: Mild cardiac enlargement.  No evidence of active pulmonary disease. Electronically Signed   By: Lucienne Capers M.D.   On: 09/11/2017 21:36    Microbiology: Recent Results (from the past 240 hour(s))  Culture, blood (x 2)     Status: None   Collection Time: 09/15/17  9:48 AM  Result Value Ref Range Status   Specimen Description BLOOD RIGHT ANTECUBITAL  Final   Special Requests IN PEDIATRIC BOTTLE Blood Culture adequate volume  Final   Culture   Final    NO GROWTH 5 DAYS Performed at [facility], [address], [place], [place] [address]    Report Status 09/20/2017 FINAL  Final  Culture, blood (x 2)     Status: None   Collection Time: 09/15/17  9:48 AM  Result Value Ref Range Status   Specimen Description BLOOD BLOOD RIGHT HAND  Final   Special Requests IN PEDIATRIC BOTTLE Blood Culture adequate volume  Final   Culture   Final    NO GROWTH 5 DAYS Performed at [facility], [place] [address] [place], [place] [address]    Report Status 09/20/2017 FINAL  Final  C difficile quick scan w PCR reflex     Status: None   Collection Time: 09/15/17 11:42 AM  Result Value Ref Range Status   C Diff antigen NEGATIVE NEGATIVE Final   C Diff toxin NEGATIVE NEGATIVE Final   C Diff interpretation NEGATIVE  Final  Gastrointestinal Panel by PCR , Stool     Status: None   Collection Time: 09/15/17 11:42 AM  Result Value Ref Range Status   Campylobacter species NOT DETECTED NOT DETECTED Final   Plesimonas shigelloides NOT DETECTED NOT DETECTED Final   Salmonella species NOT DETECTED NOT DETECTED Final  Yersinia enterocolitica NOT DETECTED NOT DETECTED Final   Vibrio species NOT DETECTED NOT DETECTED Final   Vibrio cholerae NOT  DETECTED NOT DETECTED Final   Enteroaggregative E coli (EAEC) NOT DETECTED NOT DETECTED Final   Enteropathogenic E coli (EPEC) NOT DETECTED NOT DETECTED Final   Enterotoxigenic E coli (ETEC) NOT DETECTED NOT DETECTED Final   Shiga like toxin producing E coli (STEC) NOT DETECTED NOT DETECTED Final   Shigella/Enteroinvasive E coli (EIEC) NOT DETECTED NOT DETECTED Final   Cryptosporidium NOT DETECTED NOT DETECTED Final   Cyclospora cayetanensis NOT DETECTED NOT DETECTED Final   Entamoeba histolytica NOT DETECTED NOT DETECTED Final   Giardia lamblia NOT DETECTED NOT DETECTED Final   Adenovirus F40/41 NOT DETECTED NOT DETECTED Final   Astrovirus NOT DETECTED NOT DETECTED Final   Norovirus GI/GII NOT DETECTED NOT DETECTED Final   Rotavirus [name] NOT DETECTED NOT DETECTED Final   Sapovirus (I, II, IV, and V) NOT DETECTED NOT DETECTED Final  Respiratory Panel by PCR     Status: None   Collection Time: 09/15/17  3:25 PM  Result Value Ref Range Status   Adenovirus NOT DETECTED NOT DETECTED Final   Coronavirus 229E NOT DETECTED NOT DETECTED Final   Coronavirus HKU1 NOT DETECTED NOT DETECTED Final   Coronavirus NL63 NOT DETECTED NOT DETECTED Final   Coronavirus OC43 NOT DETECTED NOT DETECTED Final   Metapneumovirus NOT DETECTED NOT DETECTED Final   Rhinovirus / Enterovirus NOT DETECTED NOT DETECTED Final   Influenza Dezyrae Kensinger NOT DETECTED NOT DETECTED Final   Influenza B NOT DETECTED NOT DETECTED Final   Parainfluenza Virus 1 NOT DETECTED NOT DETECTED Final   Parainfluenza Virus 2 NOT DETECTED NOT DETECTED Final   Parainfluenza Virus 3 NOT DETECTED NOT DETECTED Final   Parainfluenza Virus 4 NOT DETECTED NOT DETECTED Final   Respiratory Syncytial Virus NOT DETECTED NOT DETECTED Final   Bordetella pertussis NOT DETECTED NOT DETECTED Final   Chlamydophila pneumoniae NOT DETECTED NOT DETECTED Final   Mycoplasma pneumoniae NOT DETECTED NOT DETECTED Final    Comment: Performed at [facility], [address] [place], [place] [address]  MRSA PCR Screening     Status: None   Collection Time: 09/15/17  3:25 PM  Result Value Ref Range Status   MRSA by PCR NEGATIVE NEGATIVE Final    Comment:        The GeneXpert MRSA Assay (FDA approved for NASAL specimens only), is one component of [name] comprehensive MRSA colonization surveillance program. It is not intended to diagnose MRSA infection nor to guide or monitor treatment for MRSA infections.      Labs: Basic Metabolic Panel: Recent Labs  Lab 09/18/17 0313  09/19/17 0540 09/20/17 0426 09/21/17 0454 09/21/17 1615 09/22/17 0407 09/23/17 0352 09/24/17 0542  NA  --    < > 136 143 141  --  140 139 139  K  --    < > 3.0* 3.1* 3.2*  --  3.2* 3.3* 3.5  CL  --    < > 109 112* 107  --  106 103 102  CO2  --    < > 19* 23 25  --  26 28 29   GLUCOSE  --    < > 146* 114* 111*  --  112* 125* 123*  BUN  --    < > 34* 34* 29*  --  28* 25* 22*  CREATININE 3.32*  --  2.87* 2.57* 2.33*  --  2.10* 2.00* 1.83*  CALCIUM  --    < > 8.0* 8.3* 8.1*  --  8.0* 8.0* 8.0*  MG 1.7  --  1.9 1.8  --  1.5* 2.5* 1.9  --   PHOS 3.7  --  3.4  --   --   --   --   --   --    < > = values in this interval not displayed.   Liver Function Tests: Recent Labs  Lab 09/22/17 0407  ALBUMIN 1.8*   No results for input(s): LIPASE, AMYLASE in the last 168 hours. No results for input(s): AMMONIA in the last 168 hours. CBC: Recent Labs  Lab 09/18/17 0313 09/19/17 0540 09/20/17 0426 09/21/17 0454 09/22/17 0407 09/23/17 0352 09/24/17 0542  WBC 23.0* 21.8* 17.6* 14.5* 11.7* 9.5 8.6  NEUTROABS 19.0* 17.7*  --   --   --   --   --   HGB 9.7* 10.2* 10.1* 10.1* 9.7* 9.9* 9.8*  HCT 30.0* 31.9* 31.7* 31.8* 31.0* 31.1* 30.9*  MCV 78.3 78.6 78.1 78.7 78.5 78.5 79.4  PLT 211 223 210 186 161 148* 139*   Cardiac Enzymes: No results for input(s): CKTOTAL, CKMB, CKMBINDEX, TROPONINI in the last 168 hours. BNP: BNP (last 3 results) Recent Labs    09/14/17 1628  BNP 505.8*     ProBNP (last 3 results) No results for input(s): PROBNP in the last 8760 hours.  CBG: Recent Labs  Lab 09/20/17 2218  GLUCAP 157*       Signed:  Fayrene Helper MD.  [facility] 09/24/2017, 11:24 PM

## 2017-09-24 NOTE — Progress Notes (Signed)
Physical Therapy Treatment Patient Details Name: [name] MRN: [id number] DOB: [birth year] Today's Date: 09/24/2017    History of Present Illness 71 yo male admitted with ARF, hypokalemia, supratherapeutic INR. Hx of A fib, colon ca-on chemo, HTN. Pt with Sepsis/septic shock/pna/acute hypoxia    PT Comments    Attempted to work with pt earlier however awakened from sleep and he requested to come back.  Upon returning, pt up to recliner.  Pt requested performing exercises prior to mobilizing.  Therapist encouraged sit to stands or attempting to ambulate prior to fatiguing (also had rehab tech in room to assist).  Pt declined to mobilize reporting too fatigued this morning.  Educated pt to perform mobility and ADLs at home prior to working on exercises for strengthening due to his limited endurance.   Follow Up Recommendations  Home health PT     Equipment Recommendations  Wheelchair (measurements PT);Wheelchair cushion (measurements PT)    Recommendations for Other Services       Precautions / Restrictions Precautions Precautions: Fall    Mobility  Bed Mobility                  Transfers                 General transfer comment: pt up in recliner on arrival, pt and spouse performed (NT did not assist per her report); pt declined due to fatigue  Ambulation/Gait                 Stairs            Wheelchair Mobility    Modified Rankin (Stroke Patients Only)       Balance                                            Cognition Arousal/Alertness: Awake/alert Behavior During Therapy: WFL for tasks assessed/performed Overall Cognitive Status: Within Functional Limits for tasks assessed                                        Exercises General Exercises - Upper Extremity Chair Push Up: AROM;Both;Seated(chair armrest pull ups) General Exercises - Lower Extremity Ankle Circles/Pumps: AROM;Both;10  reps Quad Sets: AROM;Both;10 reps Hip ABduction/ADduction: AROM;Both;10 reps(x2) Straight Leg Raises: AROM;Both;10 reps(x2)    General Comments        Pertinent Vitals/Pain Pain Assessment: No/denies pain    Home Living                      Prior Function            PT Goals (current goals can now be found in the care plan section) Progress towards PT goals: Progressing toward goals    Frequency    Min 3X/week      PT Plan Current plan remains appropriate    Co-evaluation              AM-PAC PT "6 Clicks" Daily Activity  Outcome Measure  Difficulty turning over in bed (including adjusting bedclothes, sheets and blankets)?: A Little Difficulty moving from lying on back to sitting on the side of the bed? : A Little Difficulty sitting down on and standing up from a chair with arms (e.g., wheelchair, bedside commode, etc,.)?: A  Little Help needed moving to and from a bed to chair (including a wheelchair)?: A Little Help needed walking in hospital room?: A Lot Help needed climbing 3-5 steps with a railing? : A Lot 6 Click Score: 16    End of Session   Activity Tolerance: Patient limited by fatigue Patient left: in chair;with call bell/phone within reach;with family/visitor present Nurse Communication: Mobility status PT Visit Diagnosis: Muscle weakness (generalized) (M62.81);Difficulty in walking, not elsewhere classified (R26.2)     Time: 1100-1114 PT Time Calculation (min) (ACUTE ONLY): 14 min  Charges:  $Therapeutic Exercise: 8-22 mins                    G Codes:       Carmelia Bake, PT, DPT 09/24/2017 Pager: [phone]  York Ram E 09/24/2017, 12:14 PM

## 2017-09-24 NOTE — Progress Notes (Signed)
ANTICOAGULATION CONSULT NOTE   Pharmacy Consult for warfarin Indication: atrial fibrillation  No Known Allergies  Patient Measurements: Height: 6' 2.5" (189.2 cm) Weight: 256 lb 6.3 oz (116.3 kg) IBW/kg (Calculated) : 83.35 HEPARIN DW (KG): 111.6  Vital Signs: Temp: 98.9 F (37.2 C) (11/20 0504) Temp Source: Oral (11/20 0504) BP: 121/60 (11/20 0504) Pulse Rate: 79 (11/20 0504)  Labs: Recent Labs    09/22/17 0407 09/23/17 0352 09/24/17 0542  HGB 9.7* 9.9* 9.8*  HCT 31.0* 31.1* 30.9*  PLT 161 148* 139*  LABPROT 29.9* 27.7* 25.0*  INR 2.88 2.60 2.29  CREATININE 2.10* 2.00* 1.83*   Estimated Creatinine Clearance: 49.1 mL/min (A) (by C-G formula based on SCr of 1.83 mg/dL (H)).  Medications:   Scheduled:  . chlorhexidine  15 mL Mouth Rinse BID  . dorzolamide  1 drop Both Eyes BID   And  . timolol  1 drop Both Eyes BID  . famotidine  20 mg Oral QHS  . feeding supplement (ENSURE ENLIVE)  237 mL Oral BID BM  . furosemide  40 mg Oral BID  . guaiFENesin  600 mg Oral BID  . ipratropium  0.5 mg Nebulization TID  . levalbuterol  1.25 mg Nebulization TID  . mouth rinse  15 mL Mouth Rinse q12n4p  . metoprolol tartrate  100 mg Oral BID  . multivitamin with minerals  1 tablet Oral Daily  . nystatin  5 mL Oral QID  . sodium bicarbonate  650 mg Oral BID  . tamsulosin  0.4 mg Oral Daily  . Warfarin - Pharmacist Dosing Inpatient   Does not apply q1800   Assessment: 74 yoM readmitted 11/10 pm for worsening SOB, non-productive cough, LE edema. Prev admit 11/7-9 for hypoNa/AKI. Treating for HCAP and Afib.  Hx: Colon Ca, HTN, HPL Afib on Warfarin, LD 11/9, home dose 2mg  daily, was d/c on 1mg  daily  Today, 09/24/2017 - INR therapeutic today but trending down- warfarin restarted 11/13, doses held 11/14-11/17. Given low dose warfarin 0.5mg  11/18 and 11/19 - Hgb low, stable. Platelets slightly low - No bleeding/complications reported.  - Changed to regular diet 11/16- ate 100% of  meal tray  - No major drug interactions - SCr improving   Goal of Therapy:  INR 2-3  Plan:  1) Warfarin 1 mg PO x1 today (increase to home dose as INR trending down with 0.5mg  x 2) 2) Daily INR  Doreene Eland, PharmD, BCPS.   Pager: [phone] 09/24/2017 9:37 AM

## 2017-09-24 NOTE — Progress Notes (Signed)
Reviewed discharge instructions and paperwork with patient's SO and patient via teach back method.  Patient and his SO verbalized understanding of these instructions.  [facility] called; patient is 9th in line for pick-up for nonemergency transport; CN informed.  Wheelchair is with SO.  Patient ready for pick-up at this time.

## 2017-09-24 NOTE — Care Management Note (Signed)
Case Management Note  Patient Details  Name: ORIE BAXENDALE MRN: [id number] Date of Birth: July 27, 1946  Subjective/Objective: [facility] following for HHC/dme-w/c will go home by relative in the rm. [facility] for ambulance transp. No further CM needs.                   Action/Plan:d/c home w/HHC/dme   Expected Discharge Date:                  Expected Discharge Plan:  [place]  In-House Referral:     Discharge planning Services  CM Consult  Post Acute Care Choice:  Durable Medical Equipment(AHC-rw, 3n1) Choice offered to:  Patient  DME Arranged:  Wheelchair manual DME Agency:     HH Arranged:  RN, PT, OT, Nurse's Aide, Social Work [employer] Agency:  [place]  Status of Service:  Completed, signed off  If discussed at [employer], dates discussed:    Additional Comments:  Dessa Phi, RN 09/24/2017, 2:52 PM

## 2017-09-24 NOTE — Care Management Note (Signed)
Case Management Note  Patient Details  Name: [name] MRN: [id number] Date of Birth: 05/09/1946  Subjective/Objective:                    Action/Plan:   Expected Discharge Date:                  Expected Discharge Plan:  [place]  In-House Referral:     Discharge planning Services  CM Consult  Post Acute Care Choice:  Durable Medical Equipment(AHC-rw, 3n1) Choice offered to:  Patient  DME Arranged:  Wheelchair manual DME Agency:     HH Arranged:  RN, PT, OT, Nurse's Aide, Social Work [employer] Agency:  [place]  Status of Service:  In process, will continue to follow  If discussed at Long Length of Stay Meetings, dates discussed:    Additional Comments:  Dessa Phi, RN 09/24/2017, 12:13 PM

## 2017-09-25 DIAGNOSIS — Z7901 Long term (current) use of anticoagulants: Secondary | ICD-10-CM | POA: Diagnosis not present

## 2017-09-25 DIAGNOSIS — N170 Acute kidney failure with tubular necrosis: Secondary | ICD-10-CM | POA: Diagnosis not present

## 2017-09-26 DIAGNOSIS — I4891 Unspecified atrial fibrillation: Secondary | ICD-10-CM | POA: Diagnosis not present

## 2017-09-26 DIAGNOSIS — C189 Malignant neoplasm of colon, unspecified: Secondary | ICD-10-CM | POA: Diagnosis not present

## 2017-09-26 DIAGNOSIS — Z8701 Personal history of pneumonia (recurrent): Secondary | ICD-10-CM | POA: Diagnosis not present

## 2017-09-26 DIAGNOSIS — I10 Essential (primary) hypertension: Secondary | ICD-10-CM | POA: Diagnosis not present

## 2017-09-26 DIAGNOSIS — Z95828 Presence of other vascular implants and grafts: Secondary | ICD-10-CM | POA: Diagnosis not present

## 2017-09-26 DIAGNOSIS — F419 Anxiety disorder, unspecified: Secondary | ICD-10-CM | POA: Diagnosis not present

## 2017-09-26 DIAGNOSIS — E785 Hyperlipidemia, unspecified: Secondary | ICD-10-CM | POA: Diagnosis not present

## 2017-09-26 DIAGNOSIS — K429 Umbilical hernia without obstruction or gangrene: Secondary | ICD-10-CM | POA: Diagnosis not present

## 2017-09-26 DIAGNOSIS — Z7901 Long term (current) use of anticoagulants: Secondary | ICD-10-CM | POA: Diagnosis not present

## 2017-09-27 ENCOUNTER — Ambulatory Visit: Payer: Medicare HMO | Admitting: Nurse Practitioner

## 2017-09-27 ENCOUNTER — Other Ambulatory Visit: Payer: Medicare HMO

## 2017-09-28 DIAGNOSIS — K429 Umbilical hernia without obstruction or gangrene: Secondary | ICD-10-CM | POA: Diagnosis not present

## 2017-09-28 DIAGNOSIS — F419 Anxiety disorder, unspecified: Secondary | ICD-10-CM | POA: Diagnosis not present

## 2017-09-28 DIAGNOSIS — E785 Hyperlipidemia, unspecified: Secondary | ICD-10-CM | POA: Diagnosis not present

## 2017-09-28 DIAGNOSIS — C189 Malignant neoplasm of colon, unspecified: Secondary | ICD-10-CM | POA: Diagnosis not present

## 2017-09-28 DIAGNOSIS — Z7901 Long term (current) use of anticoagulants: Secondary | ICD-10-CM | POA: Diagnosis not present

## 2017-09-28 DIAGNOSIS — Z8701 Personal history of pneumonia (recurrent): Secondary | ICD-10-CM | POA: Diagnosis not present

## 2017-09-28 DIAGNOSIS — Z95828 Presence of other vascular implants and grafts: Secondary | ICD-10-CM | POA: Diagnosis not present

## 2017-09-28 DIAGNOSIS — I4891 Unspecified atrial fibrillation: Secondary | ICD-10-CM | POA: Diagnosis not present

## 2017-09-28 DIAGNOSIS — I10 Essential (primary) hypertension: Secondary | ICD-10-CM | POA: Diagnosis not present

## 2017-09-30 DIAGNOSIS — C189 Malignant neoplasm of colon, unspecified: Secondary | ICD-10-CM | POA: Diagnosis not present

## 2017-09-30 DIAGNOSIS — Z7901 Long term (current) use of anticoagulants: Secondary | ICD-10-CM | POA: Diagnosis not present

## 2017-09-30 DIAGNOSIS — I4891 Unspecified atrial fibrillation: Secondary | ICD-10-CM | POA: Diagnosis not present

## 2017-09-30 DIAGNOSIS — E785 Hyperlipidemia, unspecified: Secondary | ICD-10-CM | POA: Diagnosis not present

## 2017-09-30 DIAGNOSIS — Z95828 Presence of other vascular implants and grafts: Secondary | ICD-10-CM | POA: Diagnosis not present

## 2017-09-30 DIAGNOSIS — F419 Anxiety disorder, unspecified: Secondary | ICD-10-CM | POA: Diagnosis not present

## 2017-09-30 DIAGNOSIS — K429 Umbilical hernia without obstruction or gangrene: Secondary | ICD-10-CM | POA: Diagnosis not present

## 2017-09-30 DIAGNOSIS — I10 Essential (primary) hypertension: Secondary | ICD-10-CM | POA: Diagnosis not present

## 2017-09-30 DIAGNOSIS — Z8701 Personal history of pneumonia (recurrent): Secondary | ICD-10-CM | POA: Diagnosis not present

## 2017-10-01 DIAGNOSIS — E785 Hyperlipidemia, unspecified: Secondary | ICD-10-CM | POA: Diagnosis not present

## 2017-10-01 DIAGNOSIS — Z95828 Presence of other vascular implants and grafts: Secondary | ICD-10-CM | POA: Diagnosis not present

## 2017-10-01 DIAGNOSIS — Z7901 Long term (current) use of anticoagulants: Secondary | ICD-10-CM | POA: Diagnosis not present

## 2017-10-01 DIAGNOSIS — C189 Malignant neoplasm of colon, unspecified: Secondary | ICD-10-CM | POA: Diagnosis not present

## 2017-10-01 DIAGNOSIS — Z8701 Personal history of pneumonia (recurrent): Secondary | ICD-10-CM | POA: Diagnosis not present

## 2017-10-01 DIAGNOSIS — I10 Essential (primary) hypertension: Secondary | ICD-10-CM | POA: Diagnosis not present

## 2017-10-01 DIAGNOSIS — F419 Anxiety disorder, unspecified: Secondary | ICD-10-CM | POA: Diagnosis not present

## 2017-10-01 DIAGNOSIS — I4891 Unspecified atrial fibrillation: Secondary | ICD-10-CM | POA: Diagnosis not present

## 2017-10-01 DIAGNOSIS — K429 Umbilical hernia without obstruction or gangrene: Secondary | ICD-10-CM | POA: Diagnosis not present

## 2017-10-03 DIAGNOSIS — C189 Malignant neoplasm of colon, unspecified: Secondary | ICD-10-CM | POA: Diagnosis not present

## 2017-10-03 DIAGNOSIS — E785 Hyperlipidemia, unspecified: Secondary | ICD-10-CM | POA: Diagnosis not present

## 2017-10-03 DIAGNOSIS — Z95828 Presence of other vascular implants and grafts: Secondary | ICD-10-CM | POA: Diagnosis not present

## 2017-10-03 DIAGNOSIS — I482 Chronic atrial fibrillation: Secondary | ICD-10-CM | POA: Diagnosis not present

## 2017-10-03 DIAGNOSIS — Z7901 Long term (current) use of anticoagulants: Secondary | ICD-10-CM | POA: Diagnosis not present

## 2017-10-03 DIAGNOSIS — F419 Anxiety disorder, unspecified: Secondary | ICD-10-CM | POA: Diagnosis not present

## 2017-10-03 DIAGNOSIS — I10 Essential (primary) hypertension: Secondary | ICD-10-CM | POA: Diagnosis not present

## 2017-10-03 DIAGNOSIS — Z8701 Personal history of pneumonia (recurrent): Secondary | ICD-10-CM | POA: Diagnosis not present

## 2017-10-03 DIAGNOSIS — I4891 Unspecified atrial fibrillation: Secondary | ICD-10-CM | POA: Diagnosis not present

## 2017-10-03 DIAGNOSIS — K429 Umbilical hernia without obstruction or gangrene: Secondary | ICD-10-CM | POA: Diagnosis not present

## 2017-10-04 ENCOUNTER — Ambulatory Visit (HOSPITAL_BASED_OUTPATIENT_CLINIC_OR_DEPARTMENT_OTHER): Payer: Medicare HMO | Admitting: Nurse Practitioner

## 2017-10-04 ENCOUNTER — Encounter: Payer: Self-pay | Admitting: Nurse Practitioner

## 2017-10-04 ENCOUNTER — Other Ambulatory Visit (HOSPITAL_BASED_OUTPATIENT_CLINIC_OR_DEPARTMENT_OTHER): Payer: Medicare HMO

## 2017-10-04 VITALS — BP 96/65 | HR 84 | Temp 98.3°F | Resp 18 | Ht 74.5 in

## 2017-10-04 DIAGNOSIS — I10 Essential (primary) hypertension: Secondary | ICD-10-CM | POA: Diagnosis not present

## 2017-10-04 DIAGNOSIS — D630 Anemia in neoplastic disease: Secondary | ICD-10-CM

## 2017-10-04 DIAGNOSIS — C180 Malignant neoplasm of cecum: Secondary | ICD-10-CM | POA: Diagnosis not present

## 2017-10-04 DIAGNOSIS — C189 Malignant neoplasm of colon, unspecified: Secondary | ICD-10-CM

## 2017-10-04 DIAGNOSIS — E876 Hypokalemia: Secondary | ICD-10-CM

## 2017-10-04 DIAGNOSIS — C184 Malignant neoplasm of transverse colon: Secondary | ICD-10-CM | POA: Diagnosis not present

## 2017-10-04 LAB — CBC WITH DIFFERENTIAL/PLATELET
BASO%: 2.4 % — ABNORMAL HIGH (ref 0.0–2.0)
Basophils Absolute: 0.2 10*3/uL — ABNORMAL HIGH (ref 0.0–0.1)
EOS ABS: 1 10*3/uL — AB (ref 0.0–0.5)
EOS%: 12.2 % — ABNORMAL HIGH (ref 0.0–7.0)
HCT: 32.5 % — ABNORMAL LOW (ref 38.4–49.9)
HGB: 10.1 g/dL — ABNORMAL LOW (ref 13.0–17.1)
LYMPH%: 22.8 % (ref 14.0–49.0)
MCH: 25.7 pg — ABNORMAL LOW (ref 27.2–33.4)
MCHC: 31.1 g/dL — AB (ref 32.0–36.0)
MCV: 82.7 fL (ref 79.3–98.0)
MONO#: 1.1 10*3/uL — AB (ref 0.1–0.9)
MONO%: 13.2 % (ref 0.0–14.0)
NEUT%: 49.4 % (ref 39.0–75.0)
NEUTROS ABS: 4 10*3/uL (ref 1.5–6.5)
PLATELETS: 263 10*3/uL (ref 140–400)
RBC: 3.93 10*6/uL — AB (ref 4.20–5.82)
RDW: 22 % — ABNORMAL HIGH (ref 11.0–14.6)
WBC: 8 10*3/uL (ref 4.0–10.3)
lymph#: 1.8 10*3/uL (ref 0.9–3.3)

## 2017-10-04 LAB — COMPREHENSIVE METABOLIC PANEL
ALT: 18 U/L (ref 0–55)
AST: 27 U/L (ref 5–34)
Albumin: 2.3 g/dL — ABNORMAL LOW (ref 3.5–5.0)
Alkaline Phosphatase: 96 U/L (ref 40–150)
Anion Gap: 11 mEq/L (ref 3–11)
BUN: 14.4 mg/dL (ref 7.0–26.0)
CHLORIDE: 100 meq/L (ref 98–109)
CO2: 27 meq/L (ref 22–29)
Calcium: 8.6 mg/dL (ref 8.4–10.4)
Creatinine: 2 mg/dL — ABNORMAL HIGH (ref 0.7–1.3)
EGFR: 31 mL/min/{1.73_m2} — ABNORMAL LOW (ref >60)
GLUCOSE: 101 mg/dL (ref 70–140)
POTASSIUM: 3.7 meq/L (ref 3.5–5.1)
SODIUM: 137 meq/L (ref 136–145)
Total Bilirubin: 0.97 mg/dL (ref 0.20–1.20)
Total Protein: 6.5 g/dL (ref 6.4–8.3)

## 2017-10-04 NOTE — Progress Notes (Addendum)
[place] OFFICE PROGRESS NOTE   Diagnosis: Colon cancer  INTERVAL HISTORY:   [name] returns for follow-up.  He completed cycle 5 FOLFOX 08/28/2017.  Cycle 6 was held on 09/11/2017 due to hypotension/dehydration.  He was subsequently hospitalized and received IV fluids.  He was discharged home 09/13/2017.  He was readmitted 09/14/2017 with sepsis/pneumonia, atrial fibrillation.  He was discharged 09/24/2017.  He continues to feel stronger.  He is working with physical therapy.  He ambulates with a walker.  He remains out of bed most of the day.  No diarrhea.  No nausea.  Appetite remains poor.  Breathing continues to be improved.  He states he is now able to "take deep breaths".    Objective:  Vital signs in last 24 hours:  Blood pressure 96/65, pulse 84, temperature 98.3 F (36.8 C), temperature source Oral, resp. rate 18, height 6' 2.5" (1.892 m), SpO2 98 %.    HEENT: No thrush or ulcers.  Mouth appears somewhat dry. Resp: Lungs clear bilaterally. Cardio: Regular rate and rhythm. GI: Abdomen soft and nontender.  No hepatomegaly. Vascular: Very minimal lower leg edema. Neuro: Alert and oriented. Port-A-Cath without erythema.  Lab Results:  Lab Results  Component Value Date   WBC 8.0 10/04/2017   HGB 10.1 (L) 10/04/2017   HCT 32.5 (L) 10/04/2017   MCV 82.7 10/04/2017   PLT 263 10/04/2017   NEUTROABS 4.0 10/04/2017    Imaging:  No results found.  Medications: I have reviewed the patient's current medications.  Assessment/Plan: 1. Colon cancer-synchronous transverse and ascending colon masses, and multiple polyps noted on colonoscopy 04/25/2017 ? 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 Staging CT 04/25/2017 with synchronous masses at the cecum and transverse colon with evidence of locally metastatic lymph node/direct extension of  tumor ? 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 Cycle 1 FOLFOX 07/03/2017 ? Cycle 2 FOLFOX 07/17/2017 (oxaliplatin held due to mild neutropenia) ? Cycle 3 FOLFOX 07/31/2017 ? Cycle 4 FOLFOX 08/14/2017(Neulasta added) ? Cycle 5 FOLFOX 08/28/2017  2. Multiple polyps noted in the descending, sigmoid, and transverse colon on colonoscopy 04/25/2017-biopsy of a transverse colon polyp revealed low and high-grade dysplasia, other polyps returned as hyperplastic polyps and fragments of a tubular adenoma  3. History of iron deficiency anemia secondary to #1  4. Anxiety  5. Hypertension  6. Glaucoma  7. Atrial fibrillation-maintained on Coumadin  8. Multiple low-density pancreas lesions noted on CT 04/25/2017-likely benign  9.   Admission 09/11/2017 with dehydration-likely secondary to chemotherapy-induced enteritis  10.  Supratherapeutic PT/INR secondary to poor dietary intake  11.  Hypokalemia, hypomagnesemia secondary to diarrhea  12.  Admission 09/14/2017 through 09/24/2017 with sepsis/pneumonia, atrial fibrillation.     Disposition: [name] appears stable.  He is recovering from the recent hospitalizations.  To review, he has completed 5 cycles of FOLFOX.  Initially the plan was to complete 6 cycles of FOLFOX followed by a course of weekly 5-FU/leucovorin.  We decided to forego the 6th cycle of FOLFOX and begin weekly 5-FU/leucovorin 10/07/2017.  We reviewed potential toxicities associated with 5 fluorouracil including bone marrow toxicity, nausea, mouth sores, diarrhea, hand-foot syndrome.  He agrees to proceed.  He understands to begin Imodium and contact the office if he develops diarrhea.  We will monitor the PT/INR closely and make adjustments as needed.  We will see him in follow-up prior to treatment on 10/14/2017.  He will contact  the office in the interim with any problems.  Patient seen with Dr. Benay Spice.  25 minutes were spent face-to-face at today's visit with the majority of that time involved in counseling/coordination of care.    [name], [name] ANP/GNP-BC   10/04/2017  1:10 PM  This was insured visit with Ned Card.  Mr. Dietze has recovered from the admission with pneumonia.  He is completing home physical therapy.  We discussed completion of adjuvant chemotherapy.  We discussed observation versus completing a course of 5-FU/leucovorin.  He would like to proceed with weekly 5-FU/leucovorin.  He understands the potential for mucositis, diarrhea, and hematologic toxicity.  The PT/INR will be monitored closely while on weekly chemotherapy.  Julieanne Manson, MD

## 2017-10-05 ENCOUNTER — Telehealth: Payer: Self-pay

## 2017-10-05 NOTE — Telephone Encounter (Signed)
Called and spoke with patient concerning upcoming appointments.per 11/30 los

## 2017-10-07 ENCOUNTER — Other Ambulatory Visit: Payer: Self-pay | Admitting: Oncology

## 2017-10-07 ENCOUNTER — Ambulatory Visit (HOSPITAL_BASED_OUTPATIENT_CLINIC_OR_DEPARTMENT_OTHER): Payer: Medicare HMO

## 2017-10-07 ENCOUNTER — Ambulatory Visit: Payer: Medicare HMO

## 2017-10-07 ENCOUNTER — Other Ambulatory Visit (HOSPITAL_BASED_OUTPATIENT_CLINIC_OR_DEPARTMENT_OTHER): Payer: Medicare HMO

## 2017-10-07 VITALS — BP 124/77 | HR 88 | Resp 17

## 2017-10-07 DIAGNOSIS — C188 Malignant neoplasm of overlapping sites of colon: Secondary | ICD-10-CM

## 2017-10-07 DIAGNOSIS — C180 Malignant neoplasm of cecum: Secondary | ICD-10-CM

## 2017-10-07 DIAGNOSIS — Z5111 Encounter for antineoplastic chemotherapy: Secondary | ICD-10-CM | POA: Diagnosis not present

## 2017-10-07 DIAGNOSIS — I4891 Unspecified atrial fibrillation: Secondary | ICD-10-CM

## 2017-10-07 DIAGNOSIS — C184 Malignant neoplasm of transverse colon: Secondary | ICD-10-CM

## 2017-10-07 DIAGNOSIS — Z7901 Long term (current) use of anticoagulants: Secondary | ICD-10-CM

## 2017-10-07 DIAGNOSIS — Z95828 Presence of other vascular implants and grafts: Secondary | ICD-10-CM

## 2017-10-07 DIAGNOSIS — C189 Malignant neoplasm of colon, unspecified: Secondary | ICD-10-CM

## 2017-10-07 LAB — PROTIME-INR
INR: 1.8 — ABNORMAL LOW (ref 2.00–3.50)
Protime: 21.6 Seconds — ABNORMAL HIGH (ref 10.6–13.4)

## 2017-10-07 MED ORDER — SODIUM CHLORIDE 0.9% FLUSH
10.0000 mL | INTRAVENOUS | Status: DC | PRN
  Administered 2017-10-07: 10 mL
  Filled 2017-10-07: qty 10

## 2017-10-07 MED ORDER — PROCHLORPERAZINE MALEATE 10 MG PO TABS
10.0000 mg | ORAL_TABLET | Freq: Once | ORAL | Status: AC
  Administered 2017-10-07: 10 mg via ORAL

## 2017-10-07 MED ORDER — PROCHLORPERAZINE MALEATE 10 MG PO TABS
ORAL_TABLET | ORAL | Status: AC
  Filled 2017-10-07: qty 1

## 2017-10-07 MED ORDER — LEUCOVORIN CALCIUM INJECTION 350 MG
400.0000 mg/m2 | Freq: Once | INTRAVENOUS | Status: AC
  Administered 2017-10-07: 988 mg via INTRAVENOUS
  Filled 2017-10-07: qty 49.4

## 2017-10-07 MED ORDER — LEUCOVORIN CALCIUM INJECTION 350 MG
400.0000 mg/m2 | Freq: Once | INTRAVENOUS | Status: DC
  Filled 2017-10-07: qty 49.4

## 2017-10-07 MED ORDER — SODIUM CHLORIDE 0.9% FLUSH
10.0000 mL | Freq: Once | INTRAVENOUS | Status: AC
  Administered 2017-10-07: 10 mL
  Filled 2017-10-07: qty 10

## 2017-10-07 MED ORDER — HEPARIN SOD (PORK) LOCK FLUSH 100 UNIT/ML IV SOLN
500.0000 [IU] | Freq: Once | INTRAVENOUS | Status: AC | PRN
  Administered 2017-10-07: 500 [IU]
  Filled 2017-10-07: qty 5

## 2017-10-07 MED ORDER — FLUOROURACIL CHEMO INJECTION 2.5 GM/50ML
400.0000 mg/m2 | Freq: Once | INTRAVENOUS | Status: AC
  Administered 2017-10-07: 1000 mg via INTRAVENOUS
  Filled 2017-10-07: qty 20

## 2017-10-07 MED ORDER — SODIUM CHLORIDE 0.9 % IV SOLN
Freq: Once | INTRAVENOUS | Status: AC
  Administered 2017-10-07: 15:00:00 via INTRAVENOUS

## 2017-10-07 NOTE — Patient Instructions (Signed)
[place] Discharge Instructions for Patients Receiving Chemotherapy  Today you received the following chemotherapy agents: Leucovorin & 5FU  To help prevent nausea and vomiting after your treatment, we encourage you to take your nausea medication as prescribed by your doctor. If you develop nausea and vomiting that is not controlled by your nausea medication, call the clinic.   BELOW ARE SYMPTOMS THAT SHOULD BE REPORTED IMMEDIATELY:  *FEVER GREATER THAN 100.5 F  *CHILLS WITH OR WITHOUT FEVER  NAUSEA AND VOMITING THAT IS NOT CONTROLLED WITH YOUR NAUSEA MEDICATION  *UNUSUAL SHORTNESS OF BREATH  *UNUSUAL BRUISING OR BLEEDING  TENDERNESS IN MOUTH AND THROAT WITH OR WITHOUT PRESENCE OF ULCERS  *URINARY PROBLEMS  *BOWEL PROBLEMS  UNUSUAL RASH Items with * indicate a potential emergency and should be followed up as soon as possible.  Feel free to call the clinic should you have any questions or concerns. The clinic phone number is [phone].  Please show the [place] at check-in to the Emergency Department and triage nurse.

## 2017-10-07 NOTE — Patient Instructions (Signed)
Implanted Port Home Guide An implanted port is a type of central line that is placed under the skin. Central lines are used to provide IV access when treatment or nutrition needs to be given through a person's veins. Implanted ports are used for long-term IV access. An implanted port may be placed because:  You need IV medicine that would be irritating to the small veins in your hands or arms.  You need long-term IV medicines, such as antibiotics.  You need IV nutrition for a long period.  You need frequent blood draws for lab tests.  You need dialysis.  Implanted ports are usually placed in the chest area, but they can also be placed in the upper arm, the abdomen, or the leg. An implanted port has two main parts:  Reservoir. The reservoir is round and will appear as a small, raised area under your skin. The reservoir is the part where a needle is inserted to give medicines or draw blood.  Catheter. The catheter is a thin, flexible tube that extends from the reservoir. The catheter is placed into a large vein. Medicine that is inserted into the reservoir goes into the catheter and then into the vein.  How will I care for my incision site? Do not get the incision site wet. Bathe or shower as directed by your health care provider. How is my port accessed? Special steps must be taken to access the port:  Before the port is accessed, a numbing cream can be placed on the skin. This helps numb the skin over the port site.  Your health care provider uses a sterile technique to access the port. ? Your health care provider must put on a mask and sterile gloves. ? The skin over your port is cleaned carefully with an antiseptic and allowed to dry. ? The port is gently pinched between sterile gloves, and a needle is inserted into the port.  Only "non-coring" port needles should be used to access the port. Once the port is accessed, a blood return should be checked. This helps ensure that the port  is in the vein and is not clogged.  If your port needs to remain accessed for a constant infusion, a clear (transparent) bandage will be placed over the needle site. The bandage and needle will need to be changed every week, or as directed by your health care provider.  Keep the bandage covering the needle clean and dry. Do not get it wet. Follow your health care provider's instructions on how to take a shower or bath while the port is accessed.  If your port does not need to stay accessed, no bandage is needed over the port.  What is flushing? Flushing helps keep the port from getting clogged. Follow your health care provider's instructions on how and when to flush the port. Ports are usually flushed with saline solution or a medicine called heparin. The need for flushing will depend on how the port is used.  If the port is used for intermittent medicines or blood draws, the port will need to be flushed: ? After medicines have been given. ? After blood has been drawn. ? As part of routine maintenance.  If a constant infusion is running, the port may not need to be flushed.  How long will my port stay implanted? The port can stay in for as long as your health care provider thinks it is needed. When it is time for the port to come out, surgery will be   done to remove it. The procedure is similar to the one performed when the port was put in. When should I seek immediate medical care? When you have an implanted port, you should seek immediate medical care if:  You notice a bad smell coming from the incision site.  You have swelling, redness, or drainage at the incision site.  You have more swelling or pain at the port site or the surrounding area.  You have a fever that is not controlled with medicine.  This information is not intended to replace advice given to you by your health care provider. Make sure you discuss any questions you have with your health care provider. Document  Released: 10/22/2005 Document Revised: 03/29/2016 Document Reviewed: 06/29/2013 Elsevier Interactive Patient Education  2017 Elsevier Inc.  

## 2017-10-07 NOTE — Progress Notes (Signed)
Per Dr.Sherrill. Okay to treat with CR 2.0

## 2017-10-08 DIAGNOSIS — F419 Anxiety disorder, unspecified: Secondary | ICD-10-CM | POA: Diagnosis not present

## 2017-10-08 DIAGNOSIS — Z7901 Long term (current) use of anticoagulants: Secondary | ICD-10-CM | POA: Diagnosis not present

## 2017-10-08 DIAGNOSIS — K429 Umbilical hernia without obstruction or gangrene: Secondary | ICD-10-CM | POA: Diagnosis not present

## 2017-10-08 DIAGNOSIS — I4891 Unspecified atrial fibrillation: Secondary | ICD-10-CM | POA: Diagnosis not present

## 2017-10-08 DIAGNOSIS — I10 Essential (primary) hypertension: Secondary | ICD-10-CM | POA: Diagnosis not present

## 2017-10-08 DIAGNOSIS — Z8701 Personal history of pneumonia (recurrent): Secondary | ICD-10-CM | POA: Diagnosis not present

## 2017-10-08 DIAGNOSIS — C189 Malignant neoplasm of colon, unspecified: Secondary | ICD-10-CM | POA: Diagnosis not present

## 2017-10-08 DIAGNOSIS — E785 Hyperlipidemia, unspecified: Secondary | ICD-10-CM | POA: Diagnosis not present

## 2017-10-08 DIAGNOSIS — Z95828 Presence of other vascular implants and grafts: Secondary | ICD-10-CM | POA: Diagnosis not present

## 2017-10-09 DIAGNOSIS — K429 Umbilical hernia without obstruction or gangrene: Secondary | ICD-10-CM | POA: Diagnosis not present

## 2017-10-09 DIAGNOSIS — E785 Hyperlipidemia, unspecified: Secondary | ICD-10-CM | POA: Diagnosis not present

## 2017-10-09 DIAGNOSIS — C189 Malignant neoplasm of colon, unspecified: Secondary | ICD-10-CM | POA: Diagnosis not present

## 2017-10-09 DIAGNOSIS — F419 Anxiety disorder, unspecified: Secondary | ICD-10-CM | POA: Diagnosis not present

## 2017-10-09 DIAGNOSIS — Z7901 Long term (current) use of anticoagulants: Secondary | ICD-10-CM | POA: Diagnosis not present

## 2017-10-09 DIAGNOSIS — I4891 Unspecified atrial fibrillation: Secondary | ICD-10-CM | POA: Diagnosis not present

## 2017-10-09 DIAGNOSIS — Z95828 Presence of other vascular implants and grafts: Secondary | ICD-10-CM | POA: Diagnosis not present

## 2017-10-09 DIAGNOSIS — Z8701 Personal history of pneumonia (recurrent): Secondary | ICD-10-CM | POA: Diagnosis not present

## 2017-10-09 DIAGNOSIS — I10 Essential (primary) hypertension: Secondary | ICD-10-CM | POA: Diagnosis not present

## 2017-10-10 DIAGNOSIS — I4891 Unspecified atrial fibrillation: Secondary | ICD-10-CM | POA: Diagnosis not present

## 2017-10-10 DIAGNOSIS — F419 Anxiety disorder, unspecified: Secondary | ICD-10-CM | POA: Diagnosis not present

## 2017-10-10 DIAGNOSIS — K429 Umbilical hernia without obstruction or gangrene: Secondary | ICD-10-CM | POA: Diagnosis not present

## 2017-10-10 DIAGNOSIS — Z7901 Long term (current) use of anticoagulants: Secondary | ICD-10-CM | POA: Diagnosis not present

## 2017-10-10 DIAGNOSIS — C189 Malignant neoplasm of colon, unspecified: Secondary | ICD-10-CM | POA: Diagnosis not present

## 2017-10-10 DIAGNOSIS — E785 Hyperlipidemia, unspecified: Secondary | ICD-10-CM | POA: Diagnosis not present

## 2017-10-10 DIAGNOSIS — I10 Essential (primary) hypertension: Secondary | ICD-10-CM | POA: Diagnosis not present

## 2017-10-10 DIAGNOSIS — Z8701 Personal history of pneumonia (recurrent): Secondary | ICD-10-CM | POA: Diagnosis not present

## 2017-10-10 DIAGNOSIS — Z95828 Presence of other vascular implants and grafts: Secondary | ICD-10-CM | POA: Diagnosis not present

## 2017-10-13 ENCOUNTER — Other Ambulatory Visit: Payer: Self-pay | Admitting: Oncology

## 2017-10-14 ENCOUNTER — Other Ambulatory Visit: Payer: Medicare HMO

## 2017-10-14 ENCOUNTER — Ambulatory Visit: Payer: Medicare HMO | Admitting: Oncology

## 2017-10-14 ENCOUNTER — Ambulatory Visit: Payer: Medicare HMO

## 2017-10-15 ENCOUNTER — Ambulatory Visit (HOSPITAL_BASED_OUTPATIENT_CLINIC_OR_DEPARTMENT_OTHER): Payer: Medicare HMO

## 2017-10-15 ENCOUNTER — Telehealth: Payer: Self-pay | Admitting: *Deleted

## 2017-10-15 ENCOUNTER — Other Ambulatory Visit (HOSPITAL_BASED_OUTPATIENT_CLINIC_OR_DEPARTMENT_OTHER): Payer: Medicare HMO

## 2017-10-15 ENCOUNTER — Ambulatory Visit (HOSPITAL_BASED_OUTPATIENT_CLINIC_OR_DEPARTMENT_OTHER): Payer: Medicare HMO | Admitting: Nurse Practitioner

## 2017-10-15 ENCOUNTER — Encounter: Payer: Self-pay | Admitting: Nurse Practitioner

## 2017-10-15 VITALS — BP 107/50 | HR 94 | Temp 97.8°F | Resp 18 | Ht 74.5 in | Wt 250.1 lb

## 2017-10-15 DIAGNOSIS — K429 Umbilical hernia without obstruction or gangrene: Secondary | ICD-10-CM | POA: Diagnosis not present

## 2017-10-15 DIAGNOSIS — C189 Malignant neoplasm of colon, unspecified: Secondary | ICD-10-CM

## 2017-10-15 DIAGNOSIS — I4891 Unspecified atrial fibrillation: Secondary | ICD-10-CM

## 2017-10-15 DIAGNOSIS — Z7901 Long term (current) use of anticoagulants: Secondary | ICD-10-CM

## 2017-10-15 DIAGNOSIS — C180 Malignant neoplasm of cecum: Secondary | ICD-10-CM

## 2017-10-15 DIAGNOSIS — Z5111 Encounter for antineoplastic chemotherapy: Secondary | ICD-10-CM

## 2017-10-15 DIAGNOSIS — F419 Anxiety disorder, unspecified: Secondary | ICD-10-CM | POA: Diagnosis not present

## 2017-10-15 DIAGNOSIS — D630 Anemia in neoplastic disease: Secondary | ICD-10-CM

## 2017-10-15 DIAGNOSIS — Z8701 Personal history of pneumonia (recurrent): Secondary | ICD-10-CM | POA: Diagnosis not present

## 2017-10-15 DIAGNOSIS — I10 Essential (primary) hypertension: Secondary | ICD-10-CM | POA: Diagnosis not present

## 2017-10-15 DIAGNOSIS — C184 Malignant neoplasm of transverse colon: Secondary | ICD-10-CM

## 2017-10-15 DIAGNOSIS — C188 Malignant neoplasm of overlapping sites of colon: Secondary | ICD-10-CM

## 2017-10-15 DIAGNOSIS — Z95828 Presence of other vascular implants and grafts: Secondary | ICD-10-CM | POA: Diagnosis not present

## 2017-10-15 DIAGNOSIS — E785 Hyperlipidemia, unspecified: Secondary | ICD-10-CM | POA: Diagnosis not present

## 2017-10-15 LAB — CBC WITH DIFFERENTIAL/PLATELET
BASO%: 1.6 % (ref 0.0–2.0)
BASOS ABS: 0.1 10*3/uL (ref 0.0–0.1)
EOS ABS: 1 10*3/uL — AB (ref 0.0–0.5)
EOS%: 11.5 % — ABNORMAL HIGH (ref 0.0–7.0)
HEMATOCRIT: 32.4 % — AB (ref 38.4–49.9)
HEMOGLOBIN: 9.9 g/dL — AB (ref 13.0–17.1)
LYMPH#: 1.9 10*3/uL (ref 0.9–3.3)
LYMPH%: 22.9 % (ref 14.0–49.0)
MCH: 25.6 pg — AB (ref 27.2–33.4)
MCHC: 30.6 g/dL — ABNORMAL LOW (ref 32.0–36.0)
MCV: 83.9 fL (ref 79.3–98.0)
MONO#: 0.9 10*3/uL (ref 0.1–0.9)
MONO%: 10.2 % (ref 0.0–14.0)
NEUT%: 53.8 % (ref 39.0–75.0)
NEUTROS ABS: 4.5 10*3/uL (ref 1.5–6.5)
Platelets: 201 10*3/uL (ref 140–400)
RBC: 3.86 10*6/uL — ABNORMAL LOW (ref 4.20–5.82)
RDW: 21.2 % — AB (ref 11.0–14.6)
WBC: 8.3 10*3/uL (ref 4.0–10.3)

## 2017-10-15 LAB — COMPREHENSIVE METABOLIC PANEL
ALBUMIN: 2.5 g/dL — AB (ref 3.5–5.0)
ALK PHOS: 83 U/L (ref 40–150)
ALT: 12 U/L (ref 0–55)
AST: 22 U/L (ref 5–34)
Anion Gap: 11 mEq/L (ref 3–11)
BILIRUBIN TOTAL: 0.99 mg/dL (ref 0.20–1.20)
BUN: 10.8 mg/dL (ref 7.0–26.0)
CALCIUM: 8.8 mg/dL (ref 8.4–10.4)
CO2: 21 mEq/L — ABNORMAL LOW (ref 22–29)
CREATININE: 1.4 mg/dL — AB (ref 0.7–1.3)
Chloride: 108 mEq/L (ref 98–109)
EGFR: 48 mL/min/{1.73_m2} — ABNORMAL LOW (ref >60)
GLUCOSE: 93 mg/dL (ref 70–140)
Potassium: 4.1 mEq/L (ref 3.5–5.1)
SODIUM: 140 meq/L (ref 136–145)
TOTAL PROTEIN: 6.7 g/dL (ref 6.4–8.3)

## 2017-10-15 LAB — PROTIME-INR
INR: 1.7 — AB (ref 2.00–3.50)
PROTIME: 20.4 s — AB (ref 10.6–13.4)

## 2017-10-15 MED ORDER — SODIUM CHLORIDE 0.9 % IV SOLN
Freq: Once | INTRAVENOUS | Status: AC
  Administered 2017-10-15: 15:00:00 via INTRAVENOUS

## 2017-10-15 MED ORDER — PROCHLORPERAZINE MALEATE 10 MG PO TABS
ORAL_TABLET | ORAL | Status: AC
  Filled 2017-10-15: qty 1

## 2017-10-15 MED ORDER — PROCHLORPERAZINE MALEATE 10 MG PO TABS
10.0000 mg | ORAL_TABLET | Freq: Once | ORAL | Status: AC
  Administered 2017-10-15: 10 mg via ORAL

## 2017-10-15 MED ORDER — FLUOROURACIL CHEMO INJECTION 2.5 GM/50ML
400.0000 mg/m2 | Freq: Once | INTRAVENOUS | Status: AC
  Administered 2017-10-15: 1000 mg via INTRAVENOUS
  Filled 2017-10-15: qty 20

## 2017-10-15 MED ORDER — HEPARIN SOD (PORK) LOCK FLUSH 100 UNIT/ML IV SOLN
500.0000 [IU] | Freq: Once | INTRAVENOUS | Status: AC | PRN
  Administered 2017-10-15: 500 [IU]
  Filled 2017-10-15: qty 5

## 2017-10-15 MED ORDER — SODIUM CHLORIDE 0.9% FLUSH
10.0000 mL | INTRAVENOUS | Status: DC | PRN
  Administered 2017-10-15: 10 mL
  Filled 2017-10-15: qty 10

## 2017-10-15 MED ORDER — LEUCOVORIN CALCIUM INJECTION 350 MG
400.0000 mg/m2 | Freq: Once | INTRAVENOUS | Status: AC
  Administered 2017-10-15: 988 mg via INTRAVENOUS
  Filled 2017-10-15: qty 49.4

## 2017-10-15 NOTE — Patient Instructions (Signed)
[place] Discharge Instructions for Patients Receiving Chemotherapy  Today you received the following chemotherapy agents: Leucovorin & 5FU  To help prevent nausea and vomiting after your treatment, we encourage you to take your nausea medication as prescribed by your doctor. If you develop nausea and vomiting that is not controlled by your nausea medication, call the clinic.   BELOW ARE SYMPTOMS THAT SHOULD BE REPORTED IMMEDIATELY:  *FEVER GREATER THAN 100.5 F  *CHILLS WITH OR WITHOUT FEVER  NAUSEA AND VOMITING THAT IS NOT CONTROLLED WITH YOUR NAUSEA MEDICATION  *UNUSUAL SHORTNESS OF BREATH  *UNUSUAL BRUISING OR BLEEDING  TENDERNESS IN MOUTH AND THROAT WITH OR WITHOUT PRESENCE OF ULCERS  *URINARY PROBLEMS  *BOWEL PROBLEMS  UNUSUAL RASH Items with * indicate a potential emergency and should be followed up as soon as possible.  Feel free to call the clinic should you have any questions or concerns. The clinic phone number is [phone].  Please show the [place] at check-in to the Emergency Department and triage nurse.

## 2017-10-15 NOTE — Telephone Encounter (Signed)
Pt currently in PT, [name] (sig. Other) advised she will come straight over after PT, approx 103mins-hr.  Discussed pt will have lab, MD and treatment. Message to scheduling.

## 2017-10-15 NOTE — Progress Notes (Signed)
  [facility] OFFICE PROGRESS NOTE   Diagnosis: Colon cancer   INTERVAL HISTORY:   Mr. Letts returns as scheduled. He completed cycle 1 week 1 5-FU/leucovorin 10/07/2017.  He denies nausea/vomiting.  No mouth sores.  No diarrhea.  No hand or foot pain or redness.  He denies any bleeding.  Objective:  Vital signs in last 24 hours:  Blood pressure (!) 107/50, pulse 94, temperature 97.8 F (36.6 C), temperature source Oral, resp. rate 18, height 6' 2.5" (1.892 m), weight 250 lb 1.6 oz (113.4 kg), SpO2 100 %.    HEENT: No thrush or ulcers. Resp: Lungs clear bilaterally. Cardio: Regular rate and rhythm. GI: Abdomen soft and nontender.  No hepatomegaly. Vascular: Trace edema at the lower legs bilaterally right greater than left.  Skin: Palms without erythema. Port-A-Cath without erythema.   Lab Results:  Lab Results  Component Value Date   WBC 8.3 10/15/2017   HGB 9.9 (L) 10/15/2017   HCT 32.4 (L) 10/15/2017   MCV 83.9 10/15/2017   PLT 201 10/15/2017   NEUTROABS 4.5 10/15/2017    Imaging:  No results found.  Medications: I have reviewed the patient's current medications.  Assessment/Plan: 1. Colon cancer-synchronous transverse and ascending colon masses, and multiple polyps noted on colonoscopy 04/25/2017 ? 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 Staging CT 04/25/2017 with synchronous masses at the cecum and transverse colon with evidence of locally metastatic lymph node/direct extension of tumor ? 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 Cycle 1 FOLFOX 07/03/2017 ? Cycle 2 FOLFOX 07/17/2017 (oxaliplatin held due to mild neutropenia) ? Cycle 3 FOLFOX 07/31/2017 ? Cycle 4 FOLFOX 08/14/2017(Neulasta added) ? Cycle 5 FOLFOX  08/28/2017 ? Cycle 1 weekly 5-FU/leucovorin 10/07/2017, 10/15/2017  2. Multiple polyps noted in the descending, sigmoid, and transverse colon on colonoscopy 04/25/2017-biopsy of a transverse colon polyp revealed low and high-grade dysplasia, other polyps returned as hyperplastic polyps and fragments of a tubular adenoma  3. History of iron deficiency anemia secondary to #1  4. Anxiety  5. Hypertension  6. Glaucoma  7. Atrial fibrillation-maintained on Coumadin  8. Multiple low-density pancreas lesions noted on CT 04/25/2017-likely benign  9.Admission 09/11/2017 with dehydration-likely secondary to chemotherapy-induced enteritis  10.Supratherapeutic PT/INR secondary to poor dietary intake  11.Hypokalemia, hypomagnesemia secondary to diarrhea  12.  Admission 09/14/2017 through 09/24/2017 with sepsis/pneumonia, atrial fibrillation.    Disposition: Mr. Hightower appears stable.  He tolerated cycle 1 week 1 5-fluorouracil/leucovorin without significant acute toxicity.  Plan to proceed with week 2 today as scheduled.  He will return for week 3 10/21/2017.  We will see him in follow-up on 10/28/2017.  He will continue Coumadin at the current dose, repeat PT/INR in 1 week.    [place], [place] ANP/GNP-BC   10/15/2017  2:25 PM

## 2017-10-15 NOTE — Telephone Encounter (Signed)
Per MD, pt called to be seen by MD and have treatment. Unable to reach pt, lmovm to call back as pt will need to come in for lab/MD/Chemo.

## 2017-10-16 DIAGNOSIS — Z8701 Personal history of pneumonia (recurrent): Secondary | ICD-10-CM | POA: Diagnosis not present

## 2017-10-16 DIAGNOSIS — Z7901 Long term (current) use of anticoagulants: Secondary | ICD-10-CM | POA: Diagnosis not present

## 2017-10-16 DIAGNOSIS — E785 Hyperlipidemia, unspecified: Secondary | ICD-10-CM | POA: Diagnosis not present

## 2017-10-16 DIAGNOSIS — Z95828 Presence of other vascular implants and grafts: Secondary | ICD-10-CM | POA: Diagnosis not present

## 2017-10-16 DIAGNOSIS — I4891 Unspecified atrial fibrillation: Secondary | ICD-10-CM | POA: Diagnosis not present

## 2017-10-16 DIAGNOSIS — K429 Umbilical hernia without obstruction or gangrene: Secondary | ICD-10-CM | POA: Diagnosis not present

## 2017-10-16 DIAGNOSIS — F419 Anxiety disorder, unspecified: Secondary | ICD-10-CM | POA: Diagnosis not present

## 2017-10-16 DIAGNOSIS — I10 Essential (primary) hypertension: Secondary | ICD-10-CM | POA: Diagnosis not present

## 2017-10-16 DIAGNOSIS — C189 Malignant neoplasm of colon, unspecified: Secondary | ICD-10-CM | POA: Diagnosis not present

## 2017-10-17 DIAGNOSIS — K429 Umbilical hernia without obstruction or gangrene: Secondary | ICD-10-CM | POA: Diagnosis not present

## 2017-10-17 DIAGNOSIS — Z95828 Presence of other vascular implants and grafts: Secondary | ICD-10-CM | POA: Diagnosis not present

## 2017-10-17 DIAGNOSIS — I10 Essential (primary) hypertension: Secondary | ICD-10-CM | POA: Diagnosis not present

## 2017-10-17 DIAGNOSIS — C189 Malignant neoplasm of colon, unspecified: Secondary | ICD-10-CM | POA: Diagnosis not present

## 2017-10-17 DIAGNOSIS — Z7901 Long term (current) use of anticoagulants: Secondary | ICD-10-CM | POA: Diagnosis not present

## 2017-10-17 DIAGNOSIS — E785 Hyperlipidemia, unspecified: Secondary | ICD-10-CM | POA: Diagnosis not present

## 2017-10-17 DIAGNOSIS — I4891 Unspecified atrial fibrillation: Secondary | ICD-10-CM | POA: Diagnosis not present

## 2017-10-17 DIAGNOSIS — F419 Anxiety disorder, unspecified: Secondary | ICD-10-CM | POA: Diagnosis not present

## 2017-10-17 DIAGNOSIS — Z8701 Personal history of pneumonia (recurrent): Secondary | ICD-10-CM | POA: Diagnosis not present

## 2017-10-20 ENCOUNTER — Other Ambulatory Visit: Payer: Self-pay | Admitting: Oncology

## 2017-10-21 ENCOUNTER — Telehealth: Payer: Self-pay | Admitting: *Deleted

## 2017-10-21 ENCOUNTER — Other Ambulatory Visit: Payer: Self-pay | Admitting: Nurse Practitioner

## 2017-10-21 ENCOUNTER — Ambulatory Visit (HOSPITAL_BASED_OUTPATIENT_CLINIC_OR_DEPARTMENT_OTHER): Payer: Medicare HMO

## 2017-10-21 ENCOUNTER — Other Ambulatory Visit (HOSPITAL_BASED_OUTPATIENT_CLINIC_OR_DEPARTMENT_OTHER): Payer: Medicare HMO

## 2017-10-21 DIAGNOSIS — C180 Malignant neoplasm of cecum: Secondary | ICD-10-CM | POA: Diagnosis not present

## 2017-10-21 DIAGNOSIS — C188 Malignant neoplasm of overlapping sites of colon: Secondary | ICD-10-CM

## 2017-10-21 DIAGNOSIS — C189 Malignant neoplasm of colon, unspecified: Secondary | ICD-10-CM

## 2017-10-21 DIAGNOSIS — Z5111 Encounter for antineoplastic chemotherapy: Secondary | ICD-10-CM

## 2017-10-21 DIAGNOSIS — Z7901 Long term (current) use of anticoagulants: Secondary | ICD-10-CM

## 2017-10-21 DIAGNOSIS — I4891 Unspecified atrial fibrillation: Secondary | ICD-10-CM

## 2017-10-21 DIAGNOSIS — C184 Malignant neoplasm of transverse colon: Secondary | ICD-10-CM

## 2017-10-21 LAB — COMPREHENSIVE METABOLIC PANEL
ALBUMIN: 2.4 g/dL — AB (ref 3.5–5.0)
ALK PHOS: 85 U/L (ref 40–150)
ALT: 9 U/L (ref 0–55)
AST: 21 U/L (ref 5–34)
Anion Gap: 7 mEq/L (ref 3–11)
BILIRUBIN TOTAL: 1.13 mg/dL (ref 0.20–1.20)
BUN: 9.5 mg/dL (ref 7.0–26.0)
CALCIUM: 8.3 mg/dL — AB (ref 8.4–10.4)
CO2: 23 mEq/L (ref 22–29)
Chloride: 108 mEq/L (ref 98–109)
Creatinine: 1.3 mg/dL (ref 0.7–1.3)
EGFR: 55 mL/min/{1.73_m2} — AB (ref >60)
GLUCOSE: 96 mg/dL (ref 70–140)
Potassium: 3.7 mEq/L (ref 3.5–5.1)
SODIUM: 139 meq/L (ref 136–145)
TOTAL PROTEIN: 6.3 g/dL — AB (ref 6.4–8.3)

## 2017-10-21 LAB — PROTIME-INR
INR: 2.1 (ref 2.00–3.50)
PROTIME: 25.2 s — AB (ref 10.6–13.4)

## 2017-10-21 LAB — CBC WITH DIFFERENTIAL/PLATELET
BASO%: 1.2 % (ref 0.0–2.0)
BASOS ABS: 0.1 10*3/uL (ref 0.0–0.1)
EOS ABS: 0.8 10*3/uL — AB (ref 0.0–0.5)
EOS%: 9.1 % — ABNORMAL HIGH (ref 0.0–7.0)
HEMATOCRIT: 27.6 % — AB (ref 38.4–49.9)
HEMOGLOBIN: 8.6 g/dL — AB (ref 13.0–17.1)
LYMPH#: 1.5 10*3/uL (ref 0.9–3.3)
LYMPH%: 18.1 % (ref 14.0–49.0)
MCH: 26.3 pg — AB (ref 27.2–33.4)
MCHC: 31.2 g/dL — ABNORMAL LOW (ref 32.0–36.0)
MCV: 84.4 fL (ref 79.3–98.0)
MONO#: 0.8 10*3/uL (ref 0.1–0.9)
MONO%: 9.7 % (ref 0.0–14.0)
NEUT%: 61.9 % (ref 39.0–75.0)
NEUTROS ABS: 5.2 10*3/uL (ref 1.5–6.5)
Platelets: 179 10*3/uL (ref 140–400)
RBC: 3.27 10*6/uL — ABNORMAL LOW (ref 4.20–5.82)
RDW: 21.4 % — AB (ref 11.0–14.6)
WBC: 8.4 10*3/uL (ref 4.0–10.3)

## 2017-10-21 MED ORDER — SODIUM CHLORIDE 0.9% FLUSH
10.0000 mL | INTRAVENOUS | Status: DC | PRN
Start: 2017-10-21 — End: 2017-10-21
  Administered 2017-10-21: 10 mL
  Filled 2017-10-21: qty 10

## 2017-10-21 MED ORDER — SODIUM CHLORIDE 0.9 % IV SOLN
Freq: Once | INTRAVENOUS | Status: AC
  Administered 2017-10-21: 14:00:00 via INTRAVENOUS

## 2017-10-21 MED ORDER — HEPARIN SOD (PORK) LOCK FLUSH 100 UNIT/ML IV SOLN
500.0000 [IU] | Freq: Once | INTRAVENOUS | Status: AC | PRN
  Administered 2017-10-21: 500 [IU]
  Filled 2017-10-21: qty 5

## 2017-10-21 MED ORDER — LEUCOVORIN CALCIUM INJECTION 350 MG
400.0000 mg/m2 | Freq: Once | INTRAVENOUS | Status: AC
  Administered 2017-10-21: 988 mg via INTRAVENOUS
  Filled 2017-10-21: qty 49.4

## 2017-10-21 MED ORDER — FLUOROURACIL CHEMO INJECTION 2.5 GM/50ML
400.0000 mg/m2 | Freq: Once | INTRAVENOUS | Status: AC
  Administered 2017-10-21: 1000 mg via INTRAVENOUS
  Filled 2017-10-21: qty 20

## 2017-10-21 MED ORDER — PROCHLORPERAZINE MALEATE 10 MG PO TABS
ORAL_TABLET | ORAL | Status: AC
  Filled 2017-10-21: qty 1

## 2017-10-21 MED ORDER — PROCHLORPERAZINE MALEATE 10 MG PO TABS
10.0000 mg | ORAL_TABLET | Freq: Once | ORAL | Status: AC
  Administered 2017-10-21: 10 mg via ORAL

## 2017-10-21 NOTE — Telephone Encounter (Signed)
INR reviewed by Ned Card, NP: Order received for pt to continue same dose of Coumadin, repeat INR in one week. Left message on voicemail with above information.

## 2017-10-22 DIAGNOSIS — Z8701 Personal history of pneumonia (recurrent): Secondary | ICD-10-CM | POA: Diagnosis not present

## 2017-10-22 DIAGNOSIS — E785 Hyperlipidemia, unspecified: Secondary | ICD-10-CM | POA: Diagnosis not present

## 2017-10-22 DIAGNOSIS — F419 Anxiety disorder, unspecified: Secondary | ICD-10-CM | POA: Diagnosis not present

## 2017-10-22 DIAGNOSIS — C189 Malignant neoplasm of colon, unspecified: Secondary | ICD-10-CM | POA: Diagnosis not present

## 2017-10-22 DIAGNOSIS — Z7901 Long term (current) use of anticoagulants: Secondary | ICD-10-CM | POA: Diagnosis not present

## 2017-10-22 DIAGNOSIS — Z95828 Presence of other vascular implants and grafts: Secondary | ICD-10-CM | POA: Diagnosis not present

## 2017-10-22 DIAGNOSIS — I10 Essential (primary) hypertension: Secondary | ICD-10-CM | POA: Diagnosis not present

## 2017-10-22 DIAGNOSIS — K429 Umbilical hernia without obstruction or gangrene: Secondary | ICD-10-CM | POA: Diagnosis not present

## 2017-10-22 DIAGNOSIS — I4891 Unspecified atrial fibrillation: Secondary | ICD-10-CM | POA: Diagnosis not present

## 2017-10-24 DIAGNOSIS — R269 Unspecified abnormalities of gait and mobility: Secondary | ICD-10-CM | POA: Diagnosis not present

## 2017-10-24 DIAGNOSIS — C189 Malignant neoplasm of colon, unspecified: Secondary | ICD-10-CM | POA: Diagnosis not present

## 2017-10-24 DIAGNOSIS — J189 Pneumonia, unspecified organism: Secondary | ICD-10-CM | POA: Diagnosis not present

## 2017-10-24 DIAGNOSIS — I10 Essential (primary) hypertension: Secondary | ICD-10-CM | POA: Diagnosis not present

## 2017-10-24 DIAGNOSIS — Z7901 Long term (current) use of anticoagulants: Secondary | ICD-10-CM | POA: Diagnosis not present

## 2017-10-24 DIAGNOSIS — Z9889 Other specified postprocedural states: Secondary | ICD-10-CM | POA: Diagnosis not present

## 2017-10-24 DIAGNOSIS — K429 Umbilical hernia without obstruction or gangrene: Secondary | ICD-10-CM | POA: Diagnosis not present

## 2017-10-24 DIAGNOSIS — I4891 Unspecified atrial fibrillation: Secondary | ICD-10-CM | POA: Diagnosis not present

## 2017-10-24 DIAGNOSIS — E785 Hyperlipidemia, unspecified: Secondary | ICD-10-CM | POA: Diagnosis not present

## 2017-10-24 DIAGNOSIS — Z8701 Personal history of pneumonia (recurrent): Secondary | ICD-10-CM | POA: Diagnosis not present

## 2017-10-24 DIAGNOSIS — Z8719 Personal history of other diseases of the digestive system: Secondary | ICD-10-CM | POA: Diagnosis not present

## 2017-10-24 DIAGNOSIS — Z95828 Presence of other vascular implants and grafts: Secondary | ICD-10-CM | POA: Diagnosis not present

## 2017-10-24 DIAGNOSIS — A419 Sepsis, unspecified organism: Secondary | ICD-10-CM | POA: Diagnosis not present

## 2017-10-24 DIAGNOSIS — F419 Anxiety disorder, unspecified: Secondary | ICD-10-CM | POA: Diagnosis not present

## 2017-10-27 ENCOUNTER — Other Ambulatory Visit: Payer: Self-pay | Admitting: Oncology

## 2017-10-28 ENCOUNTER — Other Ambulatory Visit: Payer: Medicare HMO

## 2017-10-28 ENCOUNTER — Other Ambulatory Visit (HOSPITAL_BASED_OUTPATIENT_CLINIC_OR_DEPARTMENT_OTHER): Payer: Medicare HMO

## 2017-10-28 ENCOUNTER — Ambulatory Visit (HOSPITAL_BASED_OUTPATIENT_CLINIC_OR_DEPARTMENT_OTHER): Payer: Medicare HMO | Admitting: Oncology

## 2017-10-28 ENCOUNTER — Ambulatory Visit (HOSPITAL_BASED_OUTPATIENT_CLINIC_OR_DEPARTMENT_OTHER): Payer: Medicare HMO

## 2017-10-28 ENCOUNTER — Telehealth: Payer: Self-pay | Admitting: Oncology

## 2017-10-28 ENCOUNTER — Ambulatory Visit: Payer: Medicare HMO

## 2017-10-28 VITALS — BP 126/54 | HR 98 | Temp 97.8°F | Ht 74.5 in | Wt 251.9 lb

## 2017-10-28 DIAGNOSIS — Z7901 Long term (current) use of anticoagulants: Secondary | ICD-10-CM | POA: Diagnosis not present

## 2017-10-28 DIAGNOSIS — C184 Malignant neoplasm of transverse colon: Secondary | ICD-10-CM

## 2017-10-28 DIAGNOSIS — C180 Malignant neoplasm of cecum: Secondary | ICD-10-CM

## 2017-10-28 DIAGNOSIS — Z95828 Presence of other vascular implants and grafts: Secondary | ICD-10-CM

## 2017-10-28 DIAGNOSIS — Z5111 Encounter for antineoplastic chemotherapy: Secondary | ICD-10-CM

## 2017-10-28 DIAGNOSIS — I4891 Unspecified atrial fibrillation: Secondary | ICD-10-CM | POA: Diagnosis not present

## 2017-10-28 DIAGNOSIS — C188 Malignant neoplasm of overlapping sites of colon: Secondary | ICD-10-CM

## 2017-10-28 DIAGNOSIS — C189 Malignant neoplasm of colon, unspecified: Secondary | ICD-10-CM

## 2017-10-28 DIAGNOSIS — D630 Anemia in neoplastic disease: Secondary | ICD-10-CM | POA: Diagnosis not present

## 2017-10-28 LAB — CBC WITH DIFFERENTIAL/PLATELET
BASO%: 1.8 % (ref 0.0–2.0)
BASOS ABS: 0.1 10*3/uL (ref 0.0–0.1)
EOS ABS: 0.6 10*3/uL — AB (ref 0.0–0.5)
EOS%: 10.3 % — ABNORMAL HIGH (ref 0.0–7.0)
HCT: 26.3 % — ABNORMAL LOW (ref 38.4–49.9)
HGB: 8.4 g/dL — ABNORMAL LOW (ref 13.0–17.1)
LYMPH%: 19.5 % (ref 14.0–49.0)
MCH: 26.6 pg — AB (ref 27.2–33.4)
MCHC: 31.7 g/dL — AB (ref 32.0–36.0)
MCV: 83.7 fL (ref 79.3–98.0)
MONO#: 0.6 10*3/uL (ref 0.1–0.9)
MONO%: 10.4 % (ref 0.0–14.0)
NEUT%: 58 % (ref 39.0–75.0)
NEUTROS ABS: 3.5 10*3/uL (ref 1.5–6.5)
Platelets: 223 10*3/uL (ref 140–400)
RBC: 3.15 10*6/uL — AB (ref 4.20–5.82)
RDW: 22.5 % — ABNORMAL HIGH (ref 11.0–14.6)
WBC: 6.1 10*3/uL (ref 4.0–10.3)
lymph#: 1.2 10*3/uL (ref 0.9–3.3)

## 2017-10-28 LAB — COMPREHENSIVE METABOLIC PANEL
ALT: 9 U/L (ref 0–55)
AST: 21 U/L (ref 5–34)
Albumin: 2.6 g/dL — ABNORMAL LOW (ref 3.5–5.0)
Alkaline Phosphatase: 84 U/L (ref 40–150)
Anion Gap: 11 mEq/L (ref 3–11)
BUN: 9.9 mg/dL (ref 7.0–26.0)
CALCIUM: 8.7 mg/dL (ref 8.4–10.4)
CHLORIDE: 108 meq/L (ref 98–109)
CO2: 20 mEq/L — ABNORMAL LOW (ref 22–29)
CREATININE: 1.5 mg/dL — AB (ref 0.7–1.3)
EGFR: 46 mL/min/{1.73_m2} — ABNORMAL LOW (ref >60)
GLUCOSE: 112 mg/dL (ref 70–140)
Potassium: 3.3 mEq/L — ABNORMAL LOW (ref 3.5–5.1)
Sodium: 140 mEq/L (ref 136–145)
Total Bilirubin: 1.53 mg/dL — ABNORMAL HIGH (ref 0.20–1.20)
Total Protein: 6.3 g/dL — ABNORMAL LOW (ref 6.4–8.3)

## 2017-10-28 LAB — PROTIME-INR
INR: 2.6 (ref 2.00–3.50)
PROTIME: 31.2 s — AB (ref 10.6–13.4)

## 2017-10-28 MED ORDER — SODIUM CHLORIDE 0.9% FLUSH
10.0000 mL | Freq: Once | INTRAVENOUS | Status: DC
  Filled 2017-10-28: qty 10

## 2017-10-28 MED ORDER — SODIUM CHLORIDE 0.9% FLUSH
10.0000 mL | INTRAVENOUS | Status: DC | PRN
  Administered 2017-10-28: 10 mL
  Filled 2017-10-28: qty 10

## 2017-10-28 MED ORDER — FLUOROURACIL CHEMO INJECTION 2.5 GM/50ML
400.0000 mg/m2 | Freq: Once | INTRAVENOUS | Status: AC
  Administered 2017-10-28: 1000 mg via INTRAVENOUS
  Filled 2017-10-28: qty 20

## 2017-10-28 MED ORDER — SODIUM CHLORIDE 0.9 % IV SOLN
Freq: Once | INTRAVENOUS | Status: AC
  Administered 2017-10-28: 14:00:00 via INTRAVENOUS

## 2017-10-28 MED ORDER — HEPARIN SOD (PORK) LOCK FLUSH 100 UNIT/ML IV SOLN
500.0000 [IU] | Freq: Once | INTRAVENOUS | Status: AC | PRN
  Administered 2017-10-28: 500 [IU]
  Filled 2017-10-28: qty 5

## 2017-10-28 MED ORDER — LEUCOVORIN CALCIUM INJECTION 350 MG
400.0000 mg/m2 | Freq: Once | INTRAVENOUS | Status: AC
  Administered 2017-10-28: 988 mg via INTRAVENOUS
  Filled 2017-10-28: qty 49.4

## 2017-10-28 MED ORDER — PROCHLORPERAZINE MALEATE 10 MG PO TABS
10.0000 mg | ORAL_TABLET | Freq: Once | ORAL | Status: AC
  Administered 2017-10-28: 10 mg via ORAL

## 2017-10-28 MED ORDER — PROCHLORPERAZINE MALEATE 10 MG PO TABS
ORAL_TABLET | ORAL | Status: AC
  Filled 2017-10-28: qty 1

## 2017-10-28 MED ORDER — WARFARIN SODIUM 1 MG PO TABS: 1.0000 mg | ORAL_TABLET | Freq: Every day | ORAL | 1 refills | Status: AC

## 2017-10-28 NOTE — Patient Instructions (Signed)
[place] Discharge Instructions for Patients Receiving Chemotherapy  Today you received the following chemotherapy agents: Leucovorin & 5FU.  To help prevent nausea and vomiting after your treatment, we encourage you to take your nausea medication as prescribed by your doctor. If you develop nausea and vomiting that is not controlled by your nausea medication, call the clinic.   BELOW ARE SYMPTOMS THAT SHOULD BE REPORTED IMMEDIATELY:  *FEVER GREATER THAN 100.5 F  *CHILLS WITH OR WITHOUT FEVER  NAUSEA AND VOMITING THAT IS NOT CONTROLLED WITH YOUR NAUSEA MEDICATION  *UNUSUAL SHORTNESS OF BREATH  *UNUSUAL BRUISING OR BLEEDING  TENDERNESS IN MOUTH AND THROAT WITH OR WITHOUT PRESENCE OF ULCERS  *URINARY PROBLEMS  *BOWEL PROBLEMS  UNUSUAL RASH Items with * indicate a potential emergency and should be followed up as soon as possible.  Feel free to call the clinic should you have any questions or concerns. The clinic phone number is [phone].  Please show the [place] at check-in to the Emergency Department and triage nurse.

## 2017-10-28 NOTE — Progress Notes (Signed)
OK to treat with creatinine 1.5 and bilirubin 1.5 per Dr. Benay Spice.

## 2017-10-28 NOTE — Telephone Encounter (Signed)
Unable to schedule appt per 12/24 los - due to capped day - scheduled appt for lab/flush/ MD - per patient aware and will be contacted when appt is added for 1/07

## 2017-10-28 NOTE — Progress Notes (Signed)
[facility] OFFICE PROGRESS NOTE   Diagnosis: Colon cancer  INTERVAL HISTORY:   [name] returns as scheduled.  He completed another treatment with 5-fluorouracil/leucovorin 10/21/2017.  No mouth sores, nausea, hand/foot pain, or frank diarrhea.  He reports an occasional loose stool.  He has altered taste and a poor appetite.  He is eating very little.  He is participating in home physical therapy.  He is able to ambulate in his home and outdoors.  Objective:  Vital signs in last 24 hours:  Blood pressure (!) 126/54, pulse 98, temperature 97.8 F (36.6 C), temperature source Oral, height 6' 2.5" (1.892 m), weight 251 lb 14.4 oz (114.3 kg), SpO2 100 %.    HEENT: No thrush or ulcers Resp: Lungs clear bilaterally, no respiratory distress Cardio: Irregular GI: No hepatosplenomegaly, nontender Vascular: No leg edema Skin: Palms without erythema  Portacath/PICC-without erythema  Lab Results:  Lab Results  Component Value Date   WBC 6.1 10/28/2017   HGB 8.4 (L) 10/28/2017   HCT 26.3 (L) 10/28/2017   MCV 83.7 10/28/2017   PLT 223 10/28/2017   NEUTROABS 3.5 10/28/2017    CMP     Component Value Date/Time   NA 140 10/28/2017 1147   K 3.3 (L) 10/28/2017 1147   CL 102 09/24/2017 0542   CO2 20 (L) 10/28/2017 1147   GLUCOSE 112 10/28/2017 1147   BUN 9.9 10/28/2017 1147   CREATININE 1.5 (H) 10/28/2017 1147   CALCIUM 8.7 10/28/2017 1147   PROT 6.3 (L) 10/28/2017 1147   ALBUMIN 2.6 (L) 10/28/2017 1147   AST 21 10/28/2017 1147   ALT 9 10/28/2017 1147   ALKPHOS 84 10/28/2017 1147   BILITOT 1.53 (H) 10/28/2017 1147   GFRNONAA 35 (L) 09/24/2017 0542   GFRAA 41 (L) 09/24/2017 0542    Lab Results  Component Value Date   CEA1 1.8 06/06/2017    Lab Results  Component Value Date   INR 2.60 10/28/2017    Medications: I have reviewed the patient's current medications.   Assessment/Plan: 1. Colon cancer-synchronous transverse and ascending colon masses,  and multiple polyps noted on colonoscopy 04/25/2017 ? 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 Staging CT 04/25/2017 with synchronous masses at the cecum and transverse colon with evidence of locally metastatic lymph node/direct extension of tumor ? 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 Cycle 1 FOLFOX 07/03/2017 ? Cycle 2 FOLFOX 07/17/2017 (oxaliplatin held due to mild neutropenia) ? Cycle 3 FOLFOX 07/31/2017 ? Cycle 4 FOLFOX 08/14/2017(Neulasta added) ? Cycle 5 FOLFOX 08/28/2017 ? Cycle 1 weekly 5-FU/leucovorin 10/07/2017  2. Multiple polyps noted in the descending, sigmoid, and transverse colon on colonoscopy 04/25/2017-biopsy of a transverse colon polyp revealed low and high-grade dysplasia, other polyps returned as hyperplastic polyps and fragments of a tubular adenoma  3. History of iron deficiency anemia secondary to #1  4. Anxiety  5. Hypertension  6. Glaucoma  7. Atrial fibrillation-maintained on Coumadin  8. Multiple low-density pancreas lesions noted on CT 04/25/2017-likely benign  9.Admission 09/11/2017 with dehydration-likely secondary to chemotherapy-induced enteritis  10.History of a supratherapeutic PT/INR secondary to poor dietary intake  11.History of hypokalemia, hypomagnesemia secondary to diarrhea  12.Admission 09/14/2017 through 09/24/2017 with sepsis/pneumonia, atrial fibrillation.    Disposition: [name] appears stable.  His performance status is slowly improving following the prolonged admission with pneumonia.  He appears to be tolerating the 5-FU/leucovorin well.  I encouraged him to increase his intake of food and nutrition supplements.  He will take potassium daily.  The PT/INR is in the  therapeutic range today.  We will check the PT/INR when he is here on 11/04/2017.  He understands it will be difficult to manage the Coumadin if his nutrition does not improve.  [name] will complete another treatment with 5-FU/leucovorin today.  He will return for chemotherapy on 11/04/2017.  He will be scheduled for an office visit and chemotherapy on 11/11/2017.  25 minutes were spent with the patient today.  The majority of the time was used for counseling and coordination of care.  Betsy Coder, MD  10/28/2017  2:38 PM

## 2017-10-28 NOTE — Patient Instructions (Signed)
Implanted Port Home Guide An implanted port is a type of central line that is placed under the skin. Central lines are used to provide IV access when treatment or nutrition needs to be given through a person's veins. Implanted ports are used for long-term IV access. An implanted port may be placed because:  You need IV medicine that would be irritating to the small veins in your hands or arms.  You need long-term IV medicines, such as antibiotics.  You need IV nutrition for a long period.  You need frequent blood draws for lab tests.  You need dialysis.  Implanted ports are usually placed in the chest area, but they can also be placed in the upper arm, the abdomen, or the leg. An implanted port has two main parts:  Reservoir. The reservoir is round and will appear as a small, raised area under your skin. The reservoir is the part where a needle is inserted to give medicines or draw blood.  Catheter. The catheter is a thin, flexible tube that extends from the reservoir. The catheter is placed into a large vein. Medicine that is inserted into the reservoir goes into the catheter and then into the vein.  How will I care for my incision site? Do not get the incision site wet. Bathe or shower as directed by your health care provider. How is my port accessed? Special steps must be taken to access the port:  Before the port is accessed, a numbing cream can be placed on the skin. This helps numb the skin over the port site.  Your health care provider uses a sterile technique to access the port. ? Your health care provider must put on a mask and sterile gloves. ? The skin over your port is cleaned carefully with an antiseptic and allowed to dry. ? The port is gently pinched between sterile gloves, and a needle is inserted into the port.  Only "non-coring" port needles should be used to access the port. Once the port is accessed, a blood return should be checked. This helps ensure that the port  is in the vein and is not clogged.  If your port needs to remain accessed for a constant infusion, a clear (transparent) bandage will be placed over the needle site. The bandage and needle will need to be changed every week, or as directed by your health care provider.  Keep the bandage covering the needle clean and dry. Do not get it wet. Follow your health care provider's instructions on how to take a shower or bath while the port is accessed.  If your port does not need to stay accessed, no bandage is needed over the port.  What is flushing? Flushing helps keep the port from getting clogged. Follow your health care provider's instructions on how and when to flush the port. Ports are usually flushed with saline solution or a medicine called heparin. The need for flushing will depend on how the port is used.  If the port is used for intermittent medicines or blood draws, the port will need to be flushed: ? After medicines have been given. ? After blood has been drawn. ? As part of routine maintenance.  If a constant infusion is running, the port may not need to be flushed.  How long will my port stay implanted? The port can stay in for as long as your health care provider thinks it is needed. When it is time for the port to come out, surgery will be   done to remove it. The procedure is similar to the one performed when the port was put in. When should I seek immediate medical care? When you have an implanted port, you should seek immediate medical care if:  You notice a bad smell coming from the incision site.  You have swelling, redness, or drainage at the incision site.  You have more swelling or pain at the port site or the surrounding area.  You have a fever that is not controlled with medicine.  This information is not intended to replace advice given to you by your health care provider. Make sure you discuss any questions you have with your health care provider. Document  Released: 10/22/2005 Document Revised: 03/29/2016 Document Reviewed: 06/29/2013 Elsevier Interactive Patient Education  2017 Elsevier Inc.  

## 2017-10-30 DIAGNOSIS — I10 Essential (primary) hypertension: Secondary | ICD-10-CM | POA: Diagnosis not present

## 2017-10-30 DIAGNOSIS — Z8701 Personal history of pneumonia (recurrent): Secondary | ICD-10-CM | POA: Diagnosis not present

## 2017-10-30 DIAGNOSIS — Z95828 Presence of other vascular implants and grafts: Secondary | ICD-10-CM | POA: Diagnosis not present

## 2017-10-30 DIAGNOSIS — Z7901 Long term (current) use of anticoagulants: Secondary | ICD-10-CM | POA: Diagnosis not present

## 2017-10-30 DIAGNOSIS — K429 Umbilical hernia without obstruction or gangrene: Secondary | ICD-10-CM | POA: Diagnosis not present

## 2017-10-30 DIAGNOSIS — I4891 Unspecified atrial fibrillation: Secondary | ICD-10-CM | POA: Diagnosis not present

## 2017-10-30 DIAGNOSIS — F419 Anxiety disorder, unspecified: Secondary | ICD-10-CM | POA: Diagnosis not present

## 2017-10-30 DIAGNOSIS — E785 Hyperlipidemia, unspecified: Secondary | ICD-10-CM | POA: Diagnosis not present

## 2017-10-30 DIAGNOSIS — C189 Malignant neoplasm of colon, unspecified: Secondary | ICD-10-CM | POA: Diagnosis not present

## 2017-10-30 NOTE — Telephone Encounter (Signed)
Added treatment for 1/07 - patient is aware of appts added - let patient know that the time given for treatment was next available due to capped day - patient gave verbal understanding.

## 2017-11-02 ENCOUNTER — Other Ambulatory Visit: Payer: Self-pay | Admitting: *Deleted

## 2017-11-03 ENCOUNTER — Other Ambulatory Visit: Payer: Self-pay | Admitting: Oncology

## 2017-11-04 ENCOUNTER — Other Ambulatory Visit (HOSPITAL_BASED_OUTPATIENT_CLINIC_OR_DEPARTMENT_OTHER): Payer: Medicare HMO

## 2017-11-04 ENCOUNTER — Other Ambulatory Visit: Payer: Self-pay | Admitting: Emergency Medicine

## 2017-11-04 ENCOUNTER — Ambulatory Visit (HOSPITAL_BASED_OUTPATIENT_CLINIC_OR_DEPARTMENT_OTHER): Payer: Medicare HMO

## 2017-11-04 ENCOUNTER — Ambulatory Visit: Payer: Medicare HMO | Admitting: Nutrition

## 2017-11-04 ENCOUNTER — Ambulatory Visit: Payer: Medicare HMO

## 2017-11-04 ENCOUNTER — Other Ambulatory Visit (HOSPITAL_COMMUNITY)
Admission: RE | Admit: 2017-11-04 | Discharge: 2017-11-04 | Disposition: A | Discharge status: Home / Self Care | Payer: Medicare HMO | Source: Ambulatory Visit | Attending: Oncology | Admitting: Oncology

## 2017-11-04 ENCOUNTER — Other Ambulatory Visit: Payer: Self-pay | Admitting: Nurse Practitioner

## 2017-11-04 ENCOUNTER — Encounter: Payer: Medicare HMO | Admitting: Nutrition

## 2017-11-04 ENCOUNTER — Encounter: Payer: Self-pay | Admitting: *Deleted

## 2017-11-04 VITALS — BP 131/82 | HR 95 | Temp 98.3°F | Resp 18

## 2017-11-04 DIAGNOSIS — C189 Malignant neoplasm of colon, unspecified: Secondary | ICD-10-CM | POA: Insufficient documentation

## 2017-11-04 DIAGNOSIS — C184 Malignant neoplasm of transverse colon: Secondary | ICD-10-CM

## 2017-11-04 DIAGNOSIS — Z5111 Encounter for antineoplastic chemotherapy: Secondary | ICD-10-CM

## 2017-11-04 DIAGNOSIS — C188 Malignant neoplasm of overlapping sites of colon: Secondary | ICD-10-CM

## 2017-11-04 DIAGNOSIS — Z95828 Presence of other vascular implants and grafts: Secondary | ICD-10-CM

## 2017-11-04 DIAGNOSIS — C180 Malignant neoplasm of cecum: Secondary | ICD-10-CM

## 2017-11-04 LAB — CBC WITH DIFFERENTIAL/PLATELET
BASO%: 1.4 % (ref 0.0–2.0)
BASOS ABS: 0.1 10*3/uL (ref 0.0–0.1)
EOS%: 6.2 % (ref 0.0–7.0)
Eosinophils Absolute: 0.5 10*3/uL (ref 0.0–0.5)
HEMATOCRIT: 27.5 % — AB (ref 38.4–49.9)
HGB: 8.8 g/dL — ABNORMAL LOW (ref 13.0–17.1)
LYMPH%: 17.8 % (ref 14.0–49.0)
MCH: 27.5 pg (ref 27.2–33.4)
MCHC: 32.1 g/dL (ref 32.0–36.0)
MCV: 85.9 fL (ref 79.3–98.0)
MONO#: 0.8 10*3/uL (ref 0.1–0.9)
MONO%: 10.1 % (ref 0.0–14.0)
NEUT#: 4.9 10*3/uL (ref 1.5–6.5)
NEUT%: 64.5 % (ref 39.0–75.0)
Platelets: 255 10*3/uL (ref 140–400)
RBC: 3.2 10*6/uL — AB (ref 4.20–5.82)
RDW: 22.1 % — ABNORMAL HIGH (ref 11.0–14.6)
WBC: 7.6 10*3/uL (ref 4.0–10.3)
lymph#: 1.3 10*3/uL (ref 0.9–3.3)

## 2017-11-04 LAB — COMPREHENSIVE METABOLIC PANEL
ALT: 9 U/L (ref 0–55)
ANION GAP: 8 meq/L (ref 3–11)
AST: 17 U/L (ref 5–34)
Albumin: 2.9 g/dL — ABNORMAL LOW (ref 3.5–5.0)
Alkaline Phosphatase: 79 U/L (ref 40–150)
BUN: 14 mg/dL (ref 7.0–26.0)
CALCIUM: 9.1 mg/dL (ref 8.4–10.4)
CHLORIDE: 111 meq/L — AB (ref 98–109)
CO2: 21 mEq/L — ABNORMAL LOW (ref 22–29)
CREATININE: 1.8 mg/dL — AB (ref 0.7–1.3)
EGFR: 37 mL/min/{1.73_m2} — ABNORMAL LOW (ref >60)
Glucose: 113 mg/dl (ref 70–140)
Potassium: 4.1 mEq/L (ref 3.5–5.1)
Sodium: 140 mEq/L (ref 136–145)
Total Bilirubin: 1.65 mg/dL — ABNORMAL HIGH (ref 0.20–1.20)
Total Protein: 6.6 g/dL (ref 6.4–8.3)

## 2017-11-04 LAB — PROTIME-INR
INR: 5.14 — AB
Prothrombin Time: 47.1 seconds — ABNORMAL HIGH (ref 11.4–15.2)

## 2017-11-04 MED ORDER — FLUOROURACIL CHEMO INJECTION 2.5 GM/50ML
400.0000 mg/m2 | Freq: Once | INTRAVENOUS | Status: AC
  Administered 2017-11-04: 1000 mg via INTRAVENOUS
  Filled 2017-11-04: qty 20

## 2017-11-04 MED ORDER — PROCHLORPERAZINE MALEATE 10 MG PO TABS
ORAL_TABLET | ORAL | Status: AC
  Filled 2017-11-04: qty 1

## 2017-11-04 MED ORDER — DEXTROSE 5 % IV SOLN
400.0000 mg/m2 | Freq: Once | INTRAVENOUS | Status: AC
  Administered 2017-11-04: 988 mg via INTRAVENOUS
  Filled 2017-11-04: qty 49.4

## 2017-11-04 MED ORDER — SODIUM CHLORIDE 0.9 % IV SOLN
Freq: Once | INTRAVENOUS | Status: AC
  Administered 2017-11-04: 14:00:00 via INTRAVENOUS

## 2017-11-04 MED ORDER — PROCHLORPERAZINE MALEATE 10 MG PO TABS
10.0000 mg | ORAL_TABLET | Freq: Once | ORAL | Status: AC
  Administered 2017-11-04: 10 mg via ORAL

## 2017-11-04 MED ORDER — HEPARIN SOD (PORK) LOCK FLUSH 100 UNIT/ML IV SOLN
500.0000 [IU] | Freq: Once | INTRAVENOUS | Status: AC | PRN
  Administered 2017-11-04: 500 [IU]
  Filled 2017-11-04: qty 5

## 2017-11-04 MED ORDER — SODIUM CHLORIDE 0.9% FLUSH
10.0000 mL | INTRAVENOUS | Status: DC | PRN
  Administered 2017-11-04: 10 mL
  Filled 2017-11-04: qty 10

## 2017-11-04 MED ORDER — LOPERAMIDE HCL 2 MG PO CAPS: 2.0000 mg | ORAL_CAPSULE | Freq: Four times a day (QID) | ORAL | 0 refills | Status: AC | PRN

## 2017-11-04 MED ORDER — SODIUM CHLORIDE 0.9% FLUSH
10.0000 mL | Freq: Once | INTRAVENOUS | Status: AC
  Administered 2017-11-04: 10 mL
  Filled 2017-11-04: qty 10

## 2017-11-04 NOTE — Progress Notes (Unsigned)
Critical INR 5.14 given to [employer] @1444  11/04/17

## 2017-11-04 NOTE — Progress Notes (Signed)
Dr. Burr Medico reviewed all lab results today.  OK to treat per Dr. Burr Medico.

## 2017-11-04 NOTE — Patient Instructions (Signed)
[facility] Discharge Instructions for Patients Receiving Chemotherapy  Today you received the following chemotherapy agents :  Leucovorin,  Fluorouracil.  To help prevent nausea and vomiting after your treatment, we encourage you to take your nausea medication    If you develop nausea and vomiting that is not controlled by your nausea medication, call the clinic.   BELOW ARE SYMPTOMS THAT SHOULD BE REPORTED IMMEDIATELY:  *FEVER GREATER THAN 100.5 F  *CHILLS WITH OR WITHOUT FEVER  NAUSEA AND VOMITING THAT IS NOT CONTROLLED WITH YOUR NAUSEA MEDICATION  *UNUSUAL SHORTNESS OF BREATH  *UNUSUAL BRUISING OR BLEEDING  TENDERNESS IN MOUTH AND THROAT WITH OR WITHOUT PRESENCE OF ULCERS  *URINARY PROBLEMS  *BOWEL PROBLEMS  UNUSUAL RASH Items with * indicate a potential emergency and should be followed up as soon as possible.  Feel free to call the clinic should you have any questions or concerns. The clinic phone number is [phone].  Please show the [facility] at check-in to the Emergency Department and triage nurse.

## 2017-11-04 NOTE — Progress Notes (Signed)
71 year old male diagnosed with colon cancer/colectomy.  He is a patient of Dr. Julieanne Manson.  Past medical history includes iron deficiency anemia, anxiety, hypertension, atrial fibrillation.  Medications include Xanax, Lipitor, Lasix, Imodium, Zofran K-dur, Compazine, and Coumadin.  Labs include albumin 2.9 on December 31.  Height: 6 feet 2.5 inches. Weight: 251 pounds December 26. Usual body weight: 280 pounds in October. BMI: 31.91.  Patient reports poor appetite and taste alterations contributing to inadequate oral intake. He reports that he loves to cook but has been unable to eat much during treatment. He has tried oral nutrition supplements but has not really enjoyed them.  Patient has lost approximately 30 pounds over 3 months.  Nutrition diagnosis: Unintended weight loss related to colon cancer and associated poor appetite and taste alterations as evidenced by 10% weight loss in 3 months.  Intervention: Educated patient on strategies for small frequent meals and snacks utilizing high-calorie, high-protein foods. Encouraged strategies to improve taste of food Provided fact sheets on ways to increase calories and protein, poor appetite, and taste changes. Encouraged patient to consume oral nutrition supplements. Questions answered.  Teach back method used.  Contact information given.  Monitoring, evaluation, goals: Patient will tolerate increased calories and protein to maintain weight.  Next visit: To be scheduled with treatment as needed.  **Disclaimer: This note was dictated with voice recognition software. Similar sounding words can inadvertently be transcribed and this note may contain transcription errors which may not have been corrected upon publication of note.**

## 2017-11-06 ENCOUNTER — Other Ambulatory Visit: Payer: Medicare HMO

## 2017-11-06 ENCOUNTER — Encounter: Payer: Self-pay | Admitting: *Deleted

## 2017-11-06 ENCOUNTER — Telehealth: Payer: Self-pay | Admitting: Nurse Practitioner

## 2017-11-06 ENCOUNTER — Other Ambulatory Visit: Payer: Self-pay | Admitting: Nurse Practitioner

## 2017-11-06 DIAGNOSIS — I4891 Unspecified atrial fibrillation: Secondary | ICD-10-CM | POA: Diagnosis not present

## 2017-11-06 DIAGNOSIS — Z95828 Presence of other vascular implants and grafts: Secondary | ICD-10-CM | POA: Diagnosis not present

## 2017-11-06 DIAGNOSIS — C189 Malignant neoplasm of colon, unspecified: Secondary | ICD-10-CM | POA: Diagnosis not present

## 2017-11-06 DIAGNOSIS — E785 Hyperlipidemia, unspecified: Secondary | ICD-10-CM | POA: Diagnosis not present

## 2017-11-06 DIAGNOSIS — Z7901 Long term (current) use of anticoagulants: Secondary | ICD-10-CM | POA: Diagnosis not present

## 2017-11-06 DIAGNOSIS — F419 Anxiety disorder, unspecified: Secondary | ICD-10-CM | POA: Diagnosis not present

## 2017-11-06 DIAGNOSIS — Z8701 Personal history of pneumonia (recurrent): Secondary | ICD-10-CM | POA: Diagnosis not present

## 2017-11-06 DIAGNOSIS — I10 Essential (primary) hypertension: Secondary | ICD-10-CM | POA: Diagnosis not present

## 2017-11-06 DIAGNOSIS — K429 Umbilical hernia without obstruction or gangrene: Secondary | ICD-10-CM | POA: Diagnosis not present

## 2017-11-06 NOTE — Telephone Encounter (Signed)
I left [name] a message that the PT/INR remains elevated.  He should continue to hold Coumadin.  We will repeat the PT/INR in our office on 11/07/2017.

## 2017-11-07 ENCOUNTER — Telehealth: Payer: Self-pay | Admitting: Oncology

## 2017-11-07 ENCOUNTER — Telehealth: Payer: Self-pay | Admitting: *Deleted

## 2017-11-07 NOTE — Telephone Encounter (Signed)
Left voicemail for patient regarding adding a lab onto today. Per 1/2 sch msg.

## 2017-11-07 NOTE — Telephone Encounter (Signed)
Message from pt's partner requesting return call re: lab appt. Message from [place], [facility] RN requesting return call to confirm critical lab result was received on 11/06/17. Returned call to Jana Half, she reports "he doesn't feel up to coming in." He vomited after chemo on 12/31. He has been having loose stools. He is taking imodium and lomotil and is down to 2 stools in past 24 hours but is not eating much. Jana Half confirms pt is not taking coumadin. "He's been off it since Monday." Pt plans to keep appt with cardiology on 1/4 and they will check lab then. Pt wants to skip chemo next week and take the sixth cycle the following week. Recommended they keep 1/7 appts since he hasn't been feeling well. She will discuss with pt. Message to MD.

## 2017-11-08 ENCOUNTER — Telehealth: Payer: Self-pay | Admitting: *Deleted

## 2017-11-08 ENCOUNTER — Telehealth: Payer: Self-pay

## 2017-11-08 DIAGNOSIS — I4891 Unspecified atrial fibrillation: Secondary | ICD-10-CM | POA: Diagnosis not present

## 2017-11-08 DIAGNOSIS — Z95828 Presence of other vascular implants and grafts: Secondary | ICD-10-CM | POA: Diagnosis not present

## 2017-11-08 DIAGNOSIS — E785 Hyperlipidemia, unspecified: Secondary | ICD-10-CM | POA: Diagnosis not present

## 2017-11-08 DIAGNOSIS — C189 Malignant neoplasm of colon, unspecified: Secondary | ICD-10-CM | POA: Diagnosis not present

## 2017-11-08 DIAGNOSIS — Z8701 Personal history of pneumonia (recurrent): Secondary | ICD-10-CM | POA: Diagnosis not present

## 2017-11-08 DIAGNOSIS — I10 Essential (primary) hypertension: Secondary | ICD-10-CM | POA: Diagnosis not present

## 2017-11-08 DIAGNOSIS — Z7901 Long term (current) use of anticoagulants: Secondary | ICD-10-CM | POA: Diagnosis not present

## 2017-11-08 DIAGNOSIS — F419 Anxiety disorder, unspecified: Secondary | ICD-10-CM | POA: Diagnosis not present

## 2017-11-08 DIAGNOSIS — K429 Umbilical hernia without obstruction or gangrene: Secondary | ICD-10-CM | POA: Diagnosis not present

## 2017-11-08 DIAGNOSIS — I482 Chronic atrial fibrillation: Secondary | ICD-10-CM | POA: Diagnosis not present

## 2017-11-08 NOTE — Telephone Encounter (Signed)
Spoke with Ubaldo Glassing, RN from [place]. Verbal order given for social work via Dr. Benay Spice. Per RN, patient appetite is decreased, down 8lbs since last week, and patient reported diarrhea for 2 days with immodium not helping. Will make Dr. Benay Spice aware.

## 2017-11-08 NOTE — Telephone Encounter (Signed)
Left message on voicemail to remind pt not to take Coumadin unless otherwise instructed by cardiology. Keep 1/7 appt as scheduled.

## 2017-11-09 ENCOUNTER — Other Ambulatory Visit: Payer: Self-pay

## 2017-11-09 ENCOUNTER — Inpatient Hospital Stay (HOSPITAL_COMMUNITY)
Admission: EM | Admit: 2017-11-09 | Discharge: 2017-12-06 | DRG: 394 | Disposition: E | Payer: Medicare HMO | Attending: Internal Medicine | Admitting: Internal Medicine

## 2017-11-09 ENCOUNTER — Encounter (HOSPITAL_COMMUNITY): Payer: Self-pay

## 2017-11-09 DIAGNOSIS — D6481 Anemia due to antineoplastic chemotherapy: Secondary | ICD-10-CM | POA: Diagnosis present

## 2017-11-09 DIAGNOSIS — R197 Diarrhea, unspecified: Secondary | ICD-10-CM | POA: Diagnosis not present

## 2017-11-09 DIAGNOSIS — K521 Toxic gastroenteritis and colitis: Principal | ICD-10-CM | POA: Diagnosis present

## 2017-11-09 DIAGNOSIS — F419 Anxiety disorder, unspecified: Secondary | ICD-10-CM | POA: Diagnosis present

## 2017-11-09 DIAGNOSIS — Z7901 Long term (current) use of anticoagulants: Secondary | ICD-10-CM

## 2017-11-09 DIAGNOSIS — I10 Essential (primary) hypertension: Secondary | ICD-10-CM | POA: Diagnosis not present

## 2017-11-09 DIAGNOSIS — I4901 Ventricular fibrillation: Secondary | ICD-10-CM | POA: Diagnosis present

## 2017-11-09 DIAGNOSIS — R112 Nausea with vomiting, unspecified: Secondary | ICD-10-CM | POA: Diagnosis present

## 2017-11-09 DIAGNOSIS — C772 Secondary and unspecified malignant neoplasm of intra-abdominal lymph nodes: Secondary | ICD-10-CM | POA: Diagnosis present

## 2017-11-09 DIAGNOSIS — E785 Hyperlipidemia, unspecified: Secondary | ICD-10-CM | POA: Diagnosis present

## 2017-11-09 DIAGNOSIS — Z79899 Other long term (current) drug therapy: Secondary | ICD-10-CM

## 2017-11-09 DIAGNOSIS — C180 Malignant neoplasm of cecum: Secondary | ICD-10-CM | POA: Diagnosis present

## 2017-11-09 DIAGNOSIS — H409 Unspecified glaucoma: Secondary | ICD-10-CM | POA: Diagnosis present

## 2017-11-09 DIAGNOSIS — I482 Chronic atrial fibrillation: Secondary | ICD-10-CM | POA: Diagnosis present

## 2017-11-09 DIAGNOSIS — T451X5A Adverse effect of antineoplastic and immunosuppressive drugs, initial encounter: Secondary | ICD-10-CM | POA: Diagnosis present

## 2017-11-09 DIAGNOSIS — I129 Hypertensive chronic kidney disease with stage 1 through stage 4 chronic kidney disease, or unspecified chronic kidney disease: Secondary | ICD-10-CM | POA: Diagnosis present

## 2017-11-09 DIAGNOSIS — C189 Malignant neoplasm of colon, unspecified: Secondary | ICD-10-CM | POA: Diagnosis present

## 2017-11-09 DIAGNOSIS — E872 Acidosis: Secondary | ICD-10-CM | POA: Diagnosis present

## 2017-11-09 DIAGNOSIS — R791 Abnormal coagulation profile: Secondary | ICD-10-CM | POA: Diagnosis present

## 2017-11-09 DIAGNOSIS — D631 Anemia in chronic kidney disease: Secondary | ICD-10-CM | POA: Diagnosis present

## 2017-11-09 DIAGNOSIS — I469 Cardiac arrest, cause unspecified: Secondary | ICD-10-CM | POA: Diagnosis not present

## 2017-11-09 DIAGNOSIS — Z8249 Family history of ischemic heart disease and other diseases of the circulatory system: Secondary | ICD-10-CM

## 2017-11-09 DIAGNOSIS — R1110 Vomiting, unspecified: Secondary | ICD-10-CM | POA: Diagnosis not present

## 2017-11-09 DIAGNOSIS — I4891 Unspecified atrial fibrillation: Secondary | ICD-10-CM | POA: Diagnosis not present

## 2017-11-09 DIAGNOSIS — E786 Lipoprotein deficiency: Secondary | ICD-10-CM | POA: Diagnosis not present

## 2017-11-09 DIAGNOSIS — D509 Iron deficiency anemia, unspecified: Secondary | ICD-10-CM | POA: Diagnosis present

## 2017-11-09 DIAGNOSIS — N179 Acute kidney failure, unspecified: Secondary | ICD-10-CM | POA: Diagnosis present

## 2017-11-09 DIAGNOSIS — E860 Dehydration: Secondary | ICD-10-CM | POA: Diagnosis present

## 2017-11-09 DIAGNOSIS — E876 Hypokalemia: Secondary | ICD-10-CM

## 2017-11-09 DIAGNOSIS — N183 Chronic kidney disease, stage 3 (moderate): Secondary | ICD-10-CM | POA: Diagnosis present

## 2017-11-09 DIAGNOSIS — C182 Malignant neoplasm of ascending colon: Secondary | ICD-10-CM | POA: Diagnosis not present

## 2017-11-09 DIAGNOSIS — C184 Malignant neoplasm of transverse colon: Secondary | ICD-10-CM | POA: Diagnosis not present

## 2017-11-09 DIAGNOSIS — C785 Secondary malignant neoplasm of large intestine and rectum: Secondary | ICD-10-CM | POA: Diagnosis not present

## 2017-11-09 HISTORY — DX: Malignant (primary) neoplasm, unspecified: C80.1

## 2017-11-09 LAB — COMPREHENSIVE METABOLIC PANEL
ALBUMIN: 2.9 g/dL — AB (ref 3.5–5.0)
ALK PHOS: 68 U/L (ref 38–126)
ALT: 9 U/L — ABNORMAL LOW (ref 17–63)
ANION GAP: 8 (ref 5–15)
AST: 16 U/L (ref 15–41)
BUN: 22 mg/dL — ABNORMAL HIGH (ref 6–20)
CALCIUM: 8.9 mg/dL (ref 8.9–10.3)
CO2: 18 mmol/L — AB (ref 22–32)
Chloride: 111 mmol/L (ref 101–111)
Creatinine, Ser: 2.08 mg/dL — ABNORMAL HIGH (ref 0.61–1.24)
GFR calc Af Amer: 35 mL/min — ABNORMAL LOW (ref >60)
GFR calc non Af Amer: 30 mL/min — ABNORMAL LOW (ref >60)
GLUCOSE: 135 mg/dL — AB (ref 65–99)
Potassium: 2.9 mmol/L — ABNORMAL LOW (ref 3.5–5.1)
SODIUM: 137 mmol/L (ref 135–145)
Total Bilirubin: 2.2 mg/dL — ABNORMAL HIGH (ref 0.3–1.2)
Total Protein: 6.4 g/dL — ABNORMAL LOW (ref 6.5–8.1)

## 2017-11-09 LAB — CBC
HEMATOCRIT: 26.2 % — AB (ref 39.0–52.0)
HEMOGLOBIN: 8.7 g/dL — AB (ref 13.0–17.0)
MCH: 28.2 pg (ref 26.0–34.0)
MCHC: 33.2 g/dL (ref 30.0–36.0)
MCV: 84.8 fL (ref 78.0–100.0)
Platelets: 235 10*3/uL (ref 150–400)
RBC: 3.09 MIL/uL — ABNORMAL LOW (ref 4.22–5.81)
RDW: 21.7 % — ABNORMAL HIGH (ref 11.5–15.5)
WBC: 4.6 10*3/uL (ref 4.0–10.5)

## 2017-11-09 LAB — LIPASE, BLOOD: Lipase: 18 U/L (ref 11–51)

## 2017-11-09 LAB — MAGNESIUM: Magnesium: 1.3 mg/dL — ABNORMAL LOW (ref 1.7–2.4)

## 2017-11-09 MED ORDER — POTASSIUM CHLORIDE CRYS ER 20 MEQ PO TBCR: 40.0000 meq | EXTENDED_RELEASE_TABLET | Freq: Every day | ORAL | Status: DC

## 2017-11-09 MED ORDER — MAGNESIUM SULFATE 2 GM/50ML IV SOLN
2.0000 g | Freq: Once | INTRAVENOUS | Status: AC
  Administered 2017-11-09: 2 g via INTRAVENOUS
  Filled 2017-11-09: qty 50

## 2017-11-09 MED ORDER — ORAL CARE MOUTH RINSE
15.0000 mL | Freq: Two times a day (BID) | OROMUCOSAL | Status: DC
  Administered 2017-11-10: 15 mL via OROMUCOSAL

## 2017-11-09 MED ORDER — PROCHLORPERAZINE MALEATE 10 MG PO TABS: 10.0000 mg | ORAL_TABLET | Freq: Four times a day (QID) | ORAL | Status: DC | PRN

## 2017-11-09 MED ORDER — SODIUM CHLORIDE 0.9 % IV BOLUS (SEPSIS)
1000.0000 mL | Freq: Once | INTRAVENOUS | Status: AC
  Administered 2017-11-09: 1000 mL via INTRAVENOUS

## 2017-11-09 MED ORDER — SODIUM CHLORIDE 0.9 % IV SOLN
8.0000 mg | Freq: Once | INTRAVENOUS | Status: AC
  Administered 2017-11-09: 8 mg via INTRAVENOUS
  Filled 2017-11-09: qty 4

## 2017-11-09 MED ORDER — LOPERAMIDE HCL 2 MG PO CAPS
2.0000 mg | ORAL_CAPSULE | Freq: Four times a day (QID) | ORAL | Status: DC | PRN
  Administered 2017-11-10 – 2017-11-11 (×5): 2 mg via ORAL
  Filled 2017-11-09 (×5): qty 1

## 2017-11-09 MED ORDER — ATORVASTATIN CALCIUM 40 MG PO TABS
80.0000 mg | ORAL_TABLET | Freq: Every evening | ORAL | Status: DC
  Administered 2017-11-10 – 2017-11-13 (×4): 80 mg via ORAL
  Filled 2017-11-09 (×4): qty 2

## 2017-11-09 MED ORDER — METOPROLOL TARTRATE 50 MG PO TABS
100.0000 mg | ORAL_TABLET | Freq: Two times a day (BID) | ORAL | Status: DC
  Administered 2017-11-10 – 2017-11-13 (×6): 100 mg via ORAL
  Filled 2017-11-09 (×9): qty 2

## 2017-11-09 MED ORDER — CHLORHEXIDINE GLUCONATE 0.12 % MT SOLN
15.0000 mL | Freq: Two times a day (BID) | OROMUCOSAL | Status: DC
  Administered 2017-11-10 (×2): 15 mL via OROMUCOSAL
  Filled 2017-11-09 (×5): qty 15

## 2017-11-09 MED ORDER — POTASSIUM CHLORIDE IN NACL 40-0.9 MEQ/L-% IV SOLN
INTRAVENOUS | Status: AC
  Administered 2017-11-09: 75 mL/h via INTRAVENOUS
  Filled 2017-11-09: qty 1000

## 2017-11-09 MED ORDER — ONDANSETRON 4 MG PO TBDP
4.0000 mg | ORAL_TABLET | Freq: Once | ORAL | Status: AC | PRN
  Administered 2017-11-09: 4 mg via ORAL
  Filled 2017-11-09: qty 1

## 2017-11-09 MED ORDER — ONDANSETRON HCL 8 MG PO TABS
8.0000 mg | ORAL_TABLET | Freq: Three times a day (TID) | ORAL | Status: DC | PRN
  Administered 2017-11-10: 8 mg via ORAL
  Filled 2017-11-09 (×2): qty 1

## 2017-11-09 MED ORDER — ALPRAZOLAM 0.5 MG PO TABS: 0.5000 mg | ORAL_TABLET | Freq: Three times a day (TID) | ORAL | Status: DC | PRN

## 2017-11-09 MED ORDER — POTASSIUM CHLORIDE CRYS ER 20 MEQ PO TBCR
40.0000 meq | EXTENDED_RELEASE_TABLET | Freq: Once | ORAL | Status: AC
  Administered 2017-11-09: 40 meq via ORAL
  Filled 2017-11-09: qty 2

## 2017-11-09 MED ORDER — DORZOLAMIDE HCL-TIMOLOL MAL 2-0.5 % OP SOLN
1.0000 [drp] | Freq: Two times a day (BID) | OPHTHALMIC | Status: DC
  Administered 2017-11-09 – 2017-11-13 (×9): 1 [drp] via OPHTHALMIC
  Filled 2017-11-09: qty 10

## 2017-11-09 MED ORDER — NYSTATIN 100000 UNIT/ML MT SUSP: 5.0000 mL | Freq: Four times a day (QID) | OROMUCOSAL | Status: DC | PRN

## 2017-11-09 MED ORDER — POTASSIUM CHLORIDE 10 MEQ/100ML IV SOLN
10.0000 meq | Freq: Once | INTRAVENOUS | Status: AC
  Administered 2017-11-09: 10 meq via INTRAVENOUS
  Filled 2017-11-09: qty 100

## 2017-11-09 MED ORDER — PROMETHAZINE HCL 25 MG/ML IJ SOLN
12.5000 mg | Freq: Once | INTRAMUSCULAR | Status: AC
  Administered 2017-11-09: 12.5 mg via INTRAVENOUS
  Filled 2017-11-09: qty 1

## 2017-11-09 MED ORDER — POTASSIUM CHLORIDE 10 MEQ/100ML IV SOLN
10.0000 meq | INTRAVENOUS | Status: AC
  Administered 2017-11-09 – 2017-11-10 (×4): 10 meq via INTRAVENOUS
  Filled 2017-11-09 (×4): qty 100

## 2017-11-09 NOTE — ED Notes (Addendum)
Attempted to call report, shift change in process. Floor cannot take report.

## 2017-11-09 NOTE — ED Provider Notes (Signed)
Care assumed from Dr. Tomi Bamberger at 1630.  Patient getting chemotherapy for cecal carcinoma. 5-FU, leucovorin rescue. Followed with Dr. Annell Greening. Last note from Dr. Learta Codding indicates poor intake on 1224. Underwent additional chemotherapy infusion on 1231. He has had diarrhea. No blood. Frequent emesis and poor appetite. Had been given Zofran and fluids per Dr. Tomi Bamberger. Potassium was low and being supplemented. Had slight acute kidney injury but was feeling slightly better. I was notified by nursing shortly thereafter the patient had additional emesis. He was given fluids and IV Zofran. He continues to feel poorly. He has known appetite and continues with nausea. I will speak with hospitalist regarding admission for hydration, antiemetics, and treatment for improvement of his acute kidney injury.  He does not have abdominal pain. He is not a risk for C. difficile. He has benign abdominal exam. His heart rate is 107 at rest. He has not been hypotensive.   Tanna Furry, MD 12/05/2017 1743

## 2017-11-09 NOTE — ED Notes (Signed)
Pt up with 1 assist and wheelchair to restroom. Unable to provide sample at this time.

## 2017-11-09 NOTE — ED Triage Notes (Signed)
Pt c/o abdominal pain, n/v, and diarrhea x3 days. Last chemo tx was 12/31 for colon ca. Denies fever.

## 2017-11-09 NOTE — ED Notes (Signed)
Pt vomit about 300cc. Pt requesting zofran IV.

## 2017-11-09 NOTE — ED Notes (Signed)
[phone] Gwinda Passe- daughter in law.

## 2017-11-09 NOTE — H&P (Signed)
History and Physical    [name]:[id number] DOB: 1946-05-01 DOA: 11/28/2017  PCP: [name], MD  Patient coming from:  home  Chief Complaint:  N/v/d  HPI: [name] is a 72 y.o. male with medical history significant of colon cancer on chemo last session dec 31 comes in with 3 days of persistent n/v/d and not able to keep much down.  No fevers.  No abdominal pain.  No blood in vomit or diarrhea.  Pt ran out of his zofran 2 days ago.  Pt referred for admission for dehydration.  Review of Systems: As per HPI otherwise 10 point review of systems negative.   Past Medical History:  Diagnosis Date  . Anxiety   . Cancer ([place])    colon  . Dysrhythmia     a fib  . Hyperlipidemia   . Hypertension    managed  . Pneumonia 1950    Past Surgical History:  Procedure Laterality Date  . APPENDECTOMY  1950  . CARDIOVERSION    . EYE SURGERY Bilateral    catract surgery  . LAPAROSCOPIC PARTIAL COLECTOMY N/A 06/06/2017   Procedure: LAPAROSCOPIC PARTIAL COLECTOMY;  Surgeon: Stark Klein, MD;  Location: [facility];  Service: General;  Laterality: N/A;  . PORTACATH PLACEMENT N/A 07/02/2017   Procedure: INSERTION PORT-A-CATH;  Surgeon: Stark Klein, MD;  Location: [facility];  Service: General;  Laterality: N/A;  . VENTRAL HERNIA REPAIR N/A 06/23/2013   Procedure: LAPAROSCOPIC VENTRAL HERNIA;  Surgeon: Shann Medal, MD;  Location: [facility];  Service: General;  Laterality: N/A;     reports that  has never smoked. he has never used smokeless tobacco. He reports that he drinks alcohol. He reports that he does not use drugs.  No Known Allergies  Family History  Problem Relation Age of Onset  . Heart disease Father     Prior to Admission medications   Medication Sig Start Date End Date Taking? Authorizing Provider  ALPRAZolam Duanne Moron) 0.5 MG tablet Take 0.5 mg by mouth 3 (three) times daily as needed for anxiety.  05/01/13  Yes [provider]  atorvastatin (LIPITOR) 80 MG tablet Take  80 mg by mouth every evening.  04/16/13  Yes [provider]  dorzolamidel-timolol (COSOPT) 22.3-6.8 MG/ML SOLN ophthalmic solution Place 1 drop into both eyes 2 (two) times daily.   Yes [provider]  furosemide (LASIX) 40 MG tablet Take 1 tablet (40 mg total) by mouth 2 (two) times daily. Patient taking differently: Take 40 mg by mouth as needed.  09/24/17 11/12/2017 Yes Elodia Florence., MD  lidocaine-prilocaine (EMLA) cream Apply to port site one hour prior to use. Do not rub in. Cover with plastic. 06/28/17  Yes Ladell Pier, MD  loperamide (IMODIUM) 2 MG capsule Take 1 capsule (2 mg total) by mouth every 6 (six) hours as needed for diarrhea or loose stools. 11/04/17  Yes Owens Shark, NP  metoprolol tartrate (LOPRESSOR) 100 MG tablet Take 1 tablet (100 mg total) by mouth 2 (two) times daily. 09/24/17 11/07/2017 Yes Elodia Florence., MD  nystatin (MYCOSTATIN) 100000 UNIT/ML suspension Take 5 mLs by mouth 4 (four) times daily as needed (for oral thrush).    Yes [provider]  ondansetron (ZOFRAN) 8 MG tablet Take 1 tablet (8 mg total) every 8 (eight) hours as needed by mouth for nausea or vomiting. 09/09/17  Yes Ladell Pier, MD  potassium chloride SA (K-DUR,KLOR-CON) 20 MEQ tablet Take 2 tablets (  40 mEq total) by mouth daily. 09/24/17  Yes Elodia Florence., MD  prochlorperazine (COMPAZINE) 10 MG tablet Take 1 tablet (10 mg total) by mouth every 6 (six) hours as needed for nausea or vomiting. 06/28/17  Yes Ladell Pier, MD  warfarin (COUMADIN) 1 MG tablet Take 1 tablet (1 mg total) by mouth daily at 6 PM. 10/28/17   Ladell Pier, MD    Physical Exam: Vitals:   11/19/2017 1630 11/26/2017 1700 11/17/2017 1730 11/08/2017 1800  BP: (!) 126/106 (!) 145/95 (!) 146/85 (!) 144/95  Pulse: (!) 127 (!) 109 (!) 106 (!) 104  Resp: 20 20 20 20   Temp:      TempSrc:      SpO2: 100% 100% 100% 100%  Weight:      Height:          Constitutional: NAD, calm,  comfortable Vitals:   12/01/2017 1630 11/26/2017 1700 11/18/2017 1730 12/01/2017 1800  BP: (!) 126/106 (!) 145/95 (!) 146/85 (!) 144/95  Pulse: (!) 127 (!) 109 (!) 106 (!) 104  Resp: 20 20 20 20   Temp:      TempSrc:      SpO2: 100% 100% 100% 100%  Weight:      Height:       Eyes: PERRL, lids and conjunctivae normal ENMT: Mucous membranes are moist. Posterior pharynx clear of any exudate or lesions.Normal dentition.  Neck: normal, supple, no masses, no thyromegaly Respiratory: clear to auscultation bilaterally, no wheezing, no crackles. Normal respiratory effort. No accessory muscle use.  Cardiovascular: Regular rate and rhythm, no murmurs / rubs / gallops. No extremity edema. 2+ pedal pulses. No carotid bruits.  Abdomen: no tenderness, no masses palpated. No hepatosplenomegaly. Bowel sounds positive.  Musculoskeletal: no clubbing / cyanosis. No joint deformity upper and lower extremities. Good ROM, no contractures. Normal muscle tone.  Skin: no rashes, lesions, ulcers. No induration Neurologic: CN 2-12 grossly intact. Sensation intact, DTR normal. Strength 5/5 in all 4.  Psychiatric: Normal judgment and insight. Alert and oriented x 3. Normal mood.    Labs on Admission: I have personally reviewed following labs and imaging studies  CBC: Recent Labs  Lab 11/04/17 1257 11/05/2017 1232  WBC 7.6 4.6  NEUTROABS 4.9  --   HGB 8.8* 8.7*  HCT 27.5* 26.2*  MCV 85.9 84.8  PLT 255 272   Basic Metabolic Panel: Recent Labs  Lab 11/04/17 1257 11/18/2017 1232  NA 140 137  K 4.1 2.9*  CL  --  111  CO2 21* 18*  GLUCOSE 113 135*  BUN 14.0 22*  CREATININE 1.8* 2.08*  CALCIUM 9.1 8.9   GFR: Estimated Creatinine Clearance: 41.9 mL/min (A) (by C-G formula based on SCr of 2.08 mg/dL (H)). Liver Function Tests: Recent Labs  Lab 11/04/17 1257 11/13/2017 1232  AST 17 16  ALT 9 9*  ALKPHOS 79 68  BILITOT 1.65* 2.2*  PROT 6.6 6.4*  ALBUMIN 2.9* 2.9*   Recent Labs  Lab 12/01/2017 1232  LIPASE  18   No results for input(s): AMMONIA in the last 168 hours. Coagulation Profile: Recent Labs  Lab 11/04/17 1255 11/04/17 1320  INR Sent out for confirmation. See results in EPIC. 5.14*  PROTIME Sent out for confirmation. See results in EPIC.  --    Cardiac Enzymes: No results for input(s): CKTOTAL, CKMB, CKMBINDEX, TROPONINI in the last 168 hours. BNP (last 3 results) No results for input(s): PROBNP in the last 8760 hours. HbA1C: No results for input(s): HGBA1C  in the last 72 hours. CBG: No results for input(s): GLUCAP in the last 168 hours. Lipid Profile: No results for input(s): CHOL, HDL, LDLCALC, TRIG, CHOLHDL, LDLDIRECT in the last 72 hours. Thyroid Function Tests: No results for input(s): TSH, T4TOTAL, FREET4, T3FREE, THYROIDAB in the last 72 hours. Anemia Panel: No results for input(s): VITAMINB12, FOLATE, FERRITIN, TIBC, IRON, RETICCTPCT in the last 72 hours. Urine analysis:    Component Value Date/Time   COLORURINE AMBER (A) 09/11/2017 2115   APPEARANCEUR CLOUDY (A) 09/11/2017 2115   LABSPEC 1.025 09/11/2017 2115   PHURINE 5.0 09/11/2017 2115   GLUCOSEU NEGATIVE 09/11/2017 2115   HGBUR NEGATIVE 09/11/2017 2115   BILIRUBINUR NEGATIVE 09/11/2017 2115   [place] NEGATIVE 09/11/2017 2115   PROTEINUR 100 (A) 09/11/2017 2115   NITRITE NEGATIVE 09/11/2017 2115   LEUKOCYTESUR NEGATIVE 09/11/2017 2115   Sepsis Labs: !!!!!!!!!!!!!!!!!!!!!!!!!!!!!!!!!!!!!!!!!!!! @LABRCNTIP (procalcitonin:4,lacticidven:4) )No results found for this or any previous visit (from the past 240 hour(s)).   Radiological Exams on Admission: No results found.  Case discussed with edp  Assessment/Plan 72 yo male with chemo induced n/v/d  Principal Problem:   Chemotherapy-induced nausea and vomiting- ivf.  repete k.  Ck mag.  Prn zofran.  Needs zofran refill at discharge.  abd exam benign and no fevers.  He has only vomited once in ED thus far after ivf and zofran.  Active Problems:    Nausea vomiting and diarrhea- as above.   Colon cancer ([place])- noted   Essential hypertension- cont home meds   Dehydration- ivf   AKI (acute kidney injury) ([facility])ivf     DVT prophylaxis:  Scds, on coumadin which is on hold for 5 days  Code Status:  full Family Communication:  wife Disposition Plan:  Per day team Consults called:  none Admission status:  observation   Lasondra Hodgkins A MD [facility]  If 7PM-7AM, please contact night-coverage [email or web address] Password [facility]  11/26/2017, 6:42 PM

## 2017-11-09 NOTE — ED Provider Notes (Signed)
[facility] DEPT Provider Note   CSN: [id number] Arrival date & time: 11/15/2017  1108     History   Chief Complaint Chief Complaint  Patient presents with  . Emesis    hx of chemotherapy 12/31  . Diarrhea    HPI [name] is a 72 y.o. male.  HPI The patient presents to the emergency room with complaints of nausea vomiting and diarrhea that started about 3 days ago.  Patient has a history of colon cancer.  His last chemotherapy treatment was on December 31.  Patient felt his symptoms started shortly after that last treatment.  He started having trouble with multiple episodes of diarrhea approximately 5/day.  He has had a couple episodes of vomiting.  His main issue is that he has no appetite and he has not been eating or drinking.  He is feeling dehydrated and weaker.  He denies having any pain Past Medical History:  Diagnosis Date  . Anxiety   . Cancer ([place])    colon  . Dysrhythmia     a fib  . Hyperlipidemia   . Hypertension    managed  . Pneumonia 1950    Patient Active Problem List   Diagnosis Date Noted  . Pressure injury of skin 09/16/2017  . Sepsis due to pneumonia ([place]) 09/14/2017  . HLD (hyperlipidemia) 09/11/2017  . Essential hypertension 09/11/2017  . Hyponatremia 09/11/2017  . ARF (acute renal failure) ([place]) 09/11/2017  . Leukocytosis 09/11/2017  . Port catheter in place 07/17/2017  . Colon cancer ([place]) 06/06/2017  . History of ventral hernia repair, 06/23/2013 07/10/2013  . Umbilical hernia 17/61/6073    Past Surgical History:  Procedure Laterality Date  . APPENDECTOMY  1950  . CARDIOVERSION    . EYE SURGERY Bilateral    catract surgery  . LAPAROSCOPIC PARTIAL COLECTOMY N/A 06/06/2017   Procedure: LAPAROSCOPIC PARTIAL COLECTOMY;  Surgeon: Stark Klein, MD;  Location: [facility];  Service: General;  Laterality: N/A;  . PORTACATH PLACEMENT N/A 07/02/2017   Procedure: INSERTION PORT-A-CATH;  Surgeon: Stark Klein, MD;   Location: [facility];  Service: General;  Laterality: N/A;  . VENTRAL HERNIA REPAIR N/A 06/23/2013   Procedure: LAPAROSCOPIC VENTRAL HERNIA;  Surgeon: Shann Medal, MD;  Location: [facility];  Service: General;  Laterality: N/A;       Home Medications    Prior to Admission medications   Medication Sig Start Date End Date Taking? Authorizing Provider  ALPRAZolam Duanne Moron) 0.5 MG tablet Take 0.5 mg by mouth 3 (three) times daily as needed for anxiety.  05/01/13  Yes [provider]  atorvastatin (LIPITOR) 80 MG tablet Take 80 mg by mouth every evening.  04/16/13  Yes [provider]  dorzolamidel-timolol (COSOPT) 22.3-6.8 MG/ML SOLN ophthalmic solution Place 1 drop into both eyes 2 (two) times daily.   Yes [provider]  furosemide (LASIX) 40 MG tablet Take 1 tablet (40 mg total) by mouth 2 (two) times daily. Patient taking differently: Take 40 mg by mouth as needed.  09/24/17 11/16/2017 Yes Elodia Florence., MD  lidocaine-prilocaine (EMLA) cream Apply to port site one hour prior to use. Do not rub in. Cover with plastic. 06/28/17  Yes Ladell Pier, MD  loperamide (IMODIUM) 2 MG capsule Take 1 capsule (2 mg total) by mouth every 6 (six) hours as needed for diarrhea or loose stools. 11/04/17  Yes Owens Shark, NP  metoprolol tartrate (LOPRESSOR) 100 MG tablet Take 1 tablet (100 mg  total) by mouth 2 (two) times daily. 09/24/17 11/08/2017 Yes Elodia Florence., MD  nystatin (MYCOSTATIN) 100000 UNIT/ML suspension Take 5 mLs by mouth 4 (four) times daily as needed (for oral thrush).    Yes [provider]  ondansetron (ZOFRAN) 8 MG tablet Take 1 tablet (8 mg total) every 8 (eight) hours as needed by mouth for nausea or vomiting. 09/09/17  Yes Ladell Pier, MD  potassium chloride SA (K-DUR,KLOR-CON) 20 MEQ tablet Take 2 tablets (40 mEq total) by mouth daily. 09/24/17  Yes Elodia Florence., MD  prochlorperazine (COMPAZINE) 10 MG tablet Take 1 tablet (10 mg  total) by mouth every 6 (six) hours as needed for nausea or vomiting. 06/28/17  Yes Ladell Pier, MD  warfarin (COUMADIN) 1 MG tablet Take 1 tablet (1 mg total) by mouth daily at 6 PM. 10/28/17   Ladell Pier, MD    Family History Family History  Problem Relation Age of Onset  . Heart disease Father     Social History Social History   Tobacco Use  . Smoking status: Never Smoker  . Smokeless tobacco: Never Used  Substance Use Topics  . Alcohol use: Yes    Alcohol/week: 0.0 oz    Comment: occasional  . Drug use: No     Allergies   Patient has no known allergies.   Review of Systems Review of Systems  All other systems reviewed and are negative.    Physical Exam Updated Vital Signs BP (!) 141/68   Pulse (!) 112   Temp 98 F (36.7 C) (Oral)   Resp 20   Ht 1.892 m (6' 2.5")   Wt 108.9 kg (240 lb)   SpO2 100%   BMI 30.40 kg/m   Physical Exam  Constitutional: He appears well-developed and well-nourished. No distress.  HENT:  Head: Normocephalic and atraumatic.  Right Ear: External ear normal.  Left Ear: External ear normal.  Mucous members mucous membranes are dry  Eyes: Conjunctivae are normal. Right eye exhibits no discharge. Left eye exhibits no discharge. No scleral icterus.  Neck: Neck supple. No tracheal deviation present.  Cardiovascular: Normal rate, regular rhythm and intact distal pulses.  Pulmonary/Chest: Effort normal and breath sounds normal. No stridor. No respiratory distress. He has no wheezes. He has no rales.  Abdominal: Soft. Bowel sounds are normal. He exhibits no distension. There is no tenderness. There is no rebound and no guarding.  Musculoskeletal: He exhibits no edema or tenderness.  Neurological: He is alert. No cranial nerve deficit (no facial droop, extraocular movements intact, no slurred speech) or sensory deficit. He exhibits normal muscle tone. He displays no seizure activity. Coordination normal.  General weakness, no  focal deficits  Skin: Skin is warm and dry. No rash noted.  Psychiatric: He has a normal mood and affect.  Nursing note and vitals reviewed.    ED Treatments / Results  Labs (all labs ordered are listed, but only abnormal results are displayed) Labs Reviewed  COMPREHENSIVE METABOLIC PANEL - Abnormal; Notable for the following components:      Result Value   Potassium 2.9 (*)    CO2 18 (*)    Glucose, Bld 135 (*)    BUN 22 (*)    Creatinine, Ser 2.08 (*)    Total Protein 6.4 (*)    Albumin 2.9 (*)    ALT 9 (*)    Total Bilirubin 2.2 (*)    GFR calc non Af Amer 30 (*)  GFR calc Af Amer 35 (*)    All other components within normal limits  CBC - Abnormal; Notable for the following components:   RBC 3.09 (*)    Hemoglobin 8.7 (*)    HCT 26.2 (*)    RDW 21.7 (*)    All other components within normal limits  LIPASE, BLOOD  URINALYSIS, ROUTINE W REFLEX MICROSCOPIC    EKG  EKG Interpretation None       Radiology No results found.  Procedures Procedures (including critical care time)  Medications Ordered in ED Medications  ondansetron (ZOFRAN-ODT) disintegrating tablet 4 mg (4 mg Oral Given 12/01/2017 1141)  sodium chloride 0.9 % bolus 1,000 mL (0 mLs Intravenous Stopped 12/02/2017 1341)  potassium chloride SA (K-DUR,KLOR-CON) CR tablet 40 mEq (40 mEq Oral Given 12/03/2017 1401)  potassium chloride 10 mEq in 100 mL IVPB (10 mEq Intravenous New Bag/Given 11/06/2017 1401)     Initial Impression / Assessment and Plan / ED Course  I have reviewed the triage vital signs and the nursing notes.  Pertinent labs & imaging results that were available during my care of the patient were reviewed by me and considered in my medical decision making (see chart for details).  Clinical Course as of Nov 09 1617  Sat Nov 09, 2017  1356 Anemia is stable.  Low potassium and acute kidney injury noted.  Non anion gap metabolic acidosis.  C/w with his sx  [JK]  6599 Pt is no longer nauseated.  Wants  to try apple juice.  [JK]    Clinical Course User Index [JK] Dorie Rank, MD    Pt presented to the ED with vomiting and diarrhea after his last chemo tx.  Labs consistent with acute kidney injury and dehydration.  Pt improved with treatment in the ED.  He is now tolerating fluids.  I am still waiting for pt to give a ua sample.  Overall he is feeling better and would like to go home.  Will turn over to Dr. Jeneen Rinks pending ua  Final Clinical Impressions(s) / ED Diagnoses   Final diagnoses:  Vomiting and diarrhea  Dehydration  Hypokalemia      Dorie Rank, MD 11/05/2017 [phone]

## 2017-11-10 DIAGNOSIS — C189 Malignant neoplasm of colon, unspecified: Secondary | ICD-10-CM

## 2017-11-10 DIAGNOSIS — R112 Nausea with vomiting, unspecified: Secondary | ICD-10-CM | POA: Diagnosis not present

## 2017-11-10 DIAGNOSIS — I10 Essential (primary) hypertension: Secondary | ICD-10-CM | POA: Diagnosis not present

## 2017-11-10 DIAGNOSIS — T451X5A Adverse effect of antineoplastic and immunosuppressive drugs, initial encounter: Secondary | ICD-10-CM | POA: Diagnosis not present

## 2017-11-10 DIAGNOSIS — E876 Hypokalemia: Secondary | ICD-10-CM | POA: Diagnosis not present

## 2017-11-10 DIAGNOSIS — R1110 Vomiting, unspecified: Secondary | ICD-10-CM | POA: Diagnosis not present

## 2017-11-10 DIAGNOSIS — E860 Dehydration: Secondary | ICD-10-CM | POA: Diagnosis not present

## 2017-11-10 DIAGNOSIS — R197 Diarrhea, unspecified: Secondary | ICD-10-CM | POA: Diagnosis not present

## 2017-11-10 DIAGNOSIS — N179 Acute kidney failure, unspecified: Secondary | ICD-10-CM | POA: Diagnosis not present

## 2017-11-10 LAB — BASIC METABOLIC PANEL
ANION GAP: 6 (ref 5–15)
BUN: 23 mg/dL — ABNORMAL HIGH (ref 6–20)
CALCIUM: 8.6 mg/dL — AB (ref 8.9–10.3)
CO2: 17 mmol/L — ABNORMAL LOW (ref 22–32)
CREATININE: 1.79 mg/dL — AB (ref 0.61–1.24)
Chloride: 115 mmol/L — ABNORMAL HIGH (ref 101–111)
GFR calc non Af Amer: 36 mL/min — ABNORMAL LOW (ref >60)
GFR, EST AFRICAN AMERICAN: 42 mL/min — AB (ref >60)
Glucose, Bld: 118 mg/dL — ABNORMAL HIGH (ref 65–99)
Potassium: 3.8 mmol/L (ref 3.5–5.1)
SODIUM: 138 mmol/L (ref 135–145)

## 2017-11-10 LAB — CBC
HCT: 23.8 % — ABNORMAL LOW (ref 39.0–52.0)
HEMOGLOBIN: 7.9 g/dL — AB (ref 13.0–17.0)
MCH: 28.1 pg (ref 26.0–34.0)
MCHC: 33.2 g/dL (ref 30.0–36.0)
MCV: 84.7 fL (ref 78.0–100.0)
PLATELETS: 204 10*3/uL (ref 150–400)
RBC: 2.81 MIL/uL — AB (ref 4.22–5.81)
RDW: 21.4 % — ABNORMAL HIGH (ref 11.5–15.5)
WBC: 3.5 10*3/uL — AB (ref 4.0–10.5)

## 2017-11-10 LAB — PROTIME-INR
INR: 5.55 — AB
PROTHROMBIN TIME: 50 s — AB (ref 11.4–15.2)

## 2017-11-10 MED ORDER — SODIUM CHLORIDE 0.9 % IV SOLN
INTRAVENOUS | Status: DC
  Administered 2017-11-12: 03:00:00 via INTRAVENOUS

## 2017-11-10 MED ORDER — MAGNESIUM SULFATE 2 GM/50ML IV SOLN
2.0000 g | Freq: Once | INTRAVENOUS | Status: AC
  Administered 2017-11-10: 2 g via INTRAVENOUS
  Filled 2017-11-10: qty 50

## 2017-11-10 MED ORDER — PROMETHAZINE HCL 25 MG/ML IJ SOLN
12.5000 mg | Freq: Once | INTRAMUSCULAR | Status: AC
  Administered 2017-11-10: 12.5 mg via INTRAVENOUS
  Filled 2017-11-10: qty 1

## 2017-11-10 MED ORDER — PROCHLORPERAZINE MALEATE 10 MG PO TABS
10.0000 mg | ORAL_TABLET | Freq: Four times a day (QID) | ORAL | Status: DC | PRN
  Administered 2017-11-10: 10 mg via ORAL
  Filled 2017-11-10: qty 1

## 2017-11-10 MED ORDER — PHYTONADIONE 5 MG PO TABS
2.5000 mg | ORAL_TABLET | Freq: Once | ORAL | Status: AC
  Administered 2017-11-10: 2.5 mg via ORAL
  Filled 2017-11-10: qty 1

## 2017-11-10 MED ORDER — ONDANSETRON HCL 4 MG/2ML IJ SOLN
4.0000 mg | Freq: Four times a day (QID) | INTRAMUSCULAR | Status: DC | PRN
  Administered 2017-11-10 – 2017-11-11 (×2): 4 mg via INTRAVENOUS
  Filled 2017-11-10 (×3): qty 2

## 2017-11-10 NOTE — Progress Notes (Signed)
PROGRESS NOTE  [name] YDX:[id number] DOB: [birth year] DOA: 11/24/2017 PCP: [name], MD  HPI/Recap of past 24 hours: [name] is a 72 y.o. male with medical history significant of colon cancer on chemo last session dec 31 comes in with 3 days of persistent n/v/d and not able to keep much down. Reports diarrhea with 5-6 bowel movements yesterday. Admitted due to poor intake and dehydration.  Patient seen and examined with no family members at bedside. Denies abd pain or vomiting. Admits to poor appetite with limited oral intake. Can only tolerate clear liquid diet today. States diarrhea has improved with less frequent bowel movements.  Assessment/Plan: Principal Problem:   Chemotherapy-induced nausea and vomiting Active Problems:   Colon cancer (HCC)   Essential hypertension   Nausea vomiting and diarrhea   Dehydration   AKI (acute kidney injury) (HCC)  Chemotherapy-induced nausea and vomiting - gentle  IVF hydration 75 cc/hr - Prn IV zofran - Needs zofran refill at discharge - abd exam benign- afebrile  Diarrhea- most likely chemo related -management as stated above -monitor I&Os   Colon cancer ([place])- noted -Follows with Dr. Benay Spice -oncology contacted 11/10/17; Dr Burr Medico will let Dr. 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 Learta Codding  Procedures:  none  Antimicrobials:  none  DVT prophylaxis:  SCDs- INR 5.55 (11/10/17)   Objective: Vitals:   11/30/2017 1800 11/07/2017 1910 12/03/2017 2041 11/10/17 0549  BP: (!) 144/95 (!) 190/99  131/71  Pulse: (!) 109 (!) 113  97  Resp: 20 18 20 18   Temp:   98 F (36.7 C) 97.9 F (36.6 C)  TempSrc:   Oral Oral  SpO2: 100% 100% 100% 100%  Weight:   107.2 kg (236 lb 5.3 oz)   Height:   6' 2.5" (1.892 m)     Intake/Output Summary (Last 24 hours) at 11/10/2017 0811 Last data filed at 11/23/2017 2303 Gross per 24 hour  Intake 3526.25 ml  Output -  Net 3526.25 ml   Filed Weights   11/06/2017 1124 11/30/2017 2041  Weight: 108.9 kg (240 lb) 107.2 kg (236 lb 5.3 oz)    Exam:   General:  [name]  Cardiovascular: IRR no rubs or gallops  Respiratory: CTA no wheezes or rhonchi  Abdomen: soft NT ND NBS x4  Musculoskeletal: non focal  Skin: No noted open sores  Psychiatry: Mood appropriate for condition and setting   Data Reviewed: CBC: Recent Labs  Lab 11/04/17 1257 11/16/2017 1232 11/10/17 0500  WBC 7.6 4.6 3.5*  NEUTROABS 4.9  --   --   HGB 8.8* 8.7* 7.9*  HCT  27.5* 26.2* 23.8*  MCV 85.9 84.8 84.7  PLT 255 235 546   Basic Metabolic Panel: Recent Labs  Lab 11/04/17 1257 11/19/2017 1232 11/08/2017 1808 11/10/17 0500  NA 140 137  --  138  K 4.1 2.9*  --  3.8  CL  --  111  --  115*  CO2 21* 18*  --  17*  GLUCOSE 113 135*  --  118*  BUN 14.0 22*  --  23*  CREATININE 1.8* 2.08*  --  1.79*  CALCIUM 9.1 8.9  --  8.6*  MG  --   --  1.3*  --    GFR: Estimated Creatinine Clearance: 48.3 mL/min (A) (by C-G formula based on SCr of 1.79 mg/dL (H)). Liver Function Tests: Recent Labs  Lab 11/04/17 1257 11/11/2017 1232  AST 17 16  ALT 9 9*   ALKPHOS 79 68  BILITOT 1.65* 2.2*  PROT 6.6 6.4*  ALBUMIN 2.9* 2.9*   Recent Labs  Lab 11/25/2017 1232  LIPASE 18   No results for input(s): AMMONIA in the last 168 hours. Coagulation Profile: Recent Labs  Lab 11/04/17 1255 11/04/17 1320  INR Sent out for confirmation. See results in EPIC. 5.14*  PROTIME Sent out for confirmation. See results in EPIC.  --    Cardiac Enzymes: No results for input(s): CKTOTAL, CKMB, CKMBINDEX, TROPONINI in the last 168 hours. BNP (last 3 results) No results for input(s): PROBNP in the last 8760 hours. HbA1C: No results for input(s): HGBA1C in the last 72 hours. CBG: No results for input(s): GLUCAP in the last 168 hours. Lipid Profile: No results for input(s): CHOL, HDL, LDLCALC, TRIG, CHOLHDL, LDLDIRECT in the last 72 hours. Thyroid Function Tests: No results for input(s): TSH, T4TOTAL, FREET4, T3FREE, THYROIDAB in the last 72 hours. Anemia Panel: No results for input(s): VITAMINB12, FOLATE, FERRITIN, TIBC, IRON, RETICCTPCT in the last 72 hours. Urine analysis:    Component Value Date/Time   COLORURINE AMBER (A) 09/11/2017 2115   APPEARANCEUR CLOUDY (A) 09/11/2017 2115   LABSPEC 1.025 09/11/2017 2115   PHURINE 5.0 09/11/2017 2115   GLUCOSEU NEGATIVE 09/11/2017 2115   HGBUR NEGATIVE 09/11/2017 2115   BILIRUBINUR NEGATIVE 09/11/2017 2115   [place] NEGATIVE 09/11/2017 2115   PROTEINUR 100 (A) 09/11/2017 2115   NITRITE NEGATIVE 09/11/2017 2115   LEUKOCYTESUR NEGATIVE 09/11/2017 2115   Sepsis Labs: @LABRCNTIP (procalcitonin:4,lacticidven:4)  )No results found for this or any previous visit (from the past 240 hour(s)).    Studies: No results found.  Scheduled Meds: . atorvastatin  80 mg Oral QPM  . chlorhexidine  15 mL Mouth Rinse BID  . dorzolamide-timolol  1 drop Both Eyes BID  . mouth rinse  15 mL Mouth Rinse q12n4p  . metoprolol tartrate  100 mg Oral BID    Continuous Infusions: . 0.9 % NaCl with KCl 40 mEq / L 75 mL/hr  (11/21/2017 2122)     LOS: 0 days     Kayleen Memos, MD [facility] Pager [phone]  If 7PM-7AM, please contact night-coverage [email or web address] Password [email or web address] 11/10/2017, 8:11 AM

## 2017-11-10 NOTE — Progress Notes (Addendum)
[facility] for Warfarin Indication: atrial fibrillation  No Known Allergies  Patient Measurements: Height: 6' 2.5" (189.2 cm) Weight: 236 lb 5.3 oz (107.2 kg) IBW/kg (Calculated) : 83.35  Vital Signs: Temp: 97.9 F (36.6 C) (01/06 0549) Temp Source: Oral (01/06 0549) BP: 131/71 (01/06 0549) Pulse Rate: 97 (01/06 0549)  Labs: Recent Labs    12/03/2017 1232 11/10/17 0500  HGB 8.7* 7.9*  HCT 26.2* 23.8*  PLT 235 204  CREATININE 2.08* 1.79*   Estimated Creatinine Clearance: 48.3 mL/min (A) (by C-G formula based on SCr of 1.79 mg/dL (H)).  Medical History: Past Medical History:  Diagnosis Date  . Anxiety   . Cancer ([place])    colon  . Dysrhythmia     a fib  . Hyperlipidemia   . Hypertension    managed  . Pneumonia 1950   Medications:  Scheduled:  . atorvastatin  80 mg Oral QPM  . chlorhexidine  15 mL Mouth Rinse BID  . dorzolamide-timolol  1 drop Both Eyes BID  . mouth rinse  15 mL Mouth Rinse q12n4p  . metoprolol tartrate  100 mg Oral BID   Assessment: 32 yoM to ED 1/5 with 3 days N/V. Hx of colon Ca on chemotherapy, last cycle 12/31.   Hx of Afib on Warfarin 1mg  daily, on hold since 1/2 per patient. Last INR 5.14 on 12/31. Planned appt at [facility] 1/7, Pharmacy will monitor Warfarin  Goal of Therapy:  INR 2-3 Monitor platelets by anticoagulation protocol: Yes   Today, 11/10/2017  Admit INR 5.55, higher than INR on 12/31   No sign/sx bleeding, H/H low, Plt wnl  Poor po intake prior to admission   Plan:   Protime/INR ordered, levels remain elevated  Needs no Warfarin  MD notified of elevated INR, give Vitamin K 2.5mg  po Bas[place] Levels PharmD Pager [phone] 11/10/2017, 7:29 AM

## 2017-11-10 NOTE — Progress Notes (Signed)
Pt has c/o nausea throughout the day per day shift report, and hasn't experienced any relief from multiple doses of zofran and compazine. Pt started to vomit around 2040 tonight. Paged on call provider and obtained order for one-time dose of phenergan IV. 30 minutes after administration, patient stated the improvement in his symptoms was "unbelieveable". Will continue to monitor. Hortencia Conradi RN

## 2017-11-11 ENCOUNTER — Other Ambulatory Visit: Payer: Medicare HMO

## 2017-11-11 ENCOUNTER — Telehealth: Payer: Self-pay

## 2017-11-11 ENCOUNTER — Ambulatory Visit: Payer: Medicare HMO | Admitting: Oncology

## 2017-11-11 ENCOUNTER — Ambulatory Visit: Payer: Medicare HMO

## 2017-11-11 DIAGNOSIS — Z7901 Long term (current) use of anticoagulants: Secondary | ICD-10-CM | POA: Diagnosis not present

## 2017-11-11 DIAGNOSIS — R112 Nausea with vomiting, unspecified: Secondary | ICD-10-CM

## 2017-11-11 DIAGNOSIS — I4891 Unspecified atrial fibrillation: Secondary | ICD-10-CM | POA: Diagnosis not present

## 2017-11-11 DIAGNOSIS — E876 Hypokalemia: Secondary | ICD-10-CM | POA: Diagnosis present

## 2017-11-11 DIAGNOSIS — C184 Malignant neoplasm of transverse colon: Secondary | ICD-10-CM

## 2017-11-11 DIAGNOSIS — I482 Chronic atrial fibrillation: Secondary | ICD-10-CM | POA: Diagnosis present

## 2017-11-11 DIAGNOSIS — F419 Anxiety disorder, unspecified: Secondary | ICD-10-CM | POA: Diagnosis present

## 2017-11-11 DIAGNOSIS — C182 Malignant neoplasm of ascending colon: Secondary | ICD-10-CM | POA: Diagnosis not present

## 2017-11-11 DIAGNOSIS — N183 Chronic kidney disease, stage 3 (moderate): Secondary | ICD-10-CM | POA: Diagnosis present

## 2017-11-11 DIAGNOSIS — D6481 Anemia due to antineoplastic chemotherapy: Secondary | ICD-10-CM | POA: Diagnosis present

## 2017-11-11 DIAGNOSIS — I4901 Ventricular fibrillation: Secondary | ICD-10-CM | POA: Diagnosis present

## 2017-11-11 DIAGNOSIS — E872 Acidosis: Secondary | ICD-10-CM | POA: Diagnosis present

## 2017-11-11 DIAGNOSIS — C772 Secondary and unspecified malignant neoplasm of intra-abdominal lymph nodes: Secondary | ICD-10-CM | POA: Diagnosis present

## 2017-11-11 DIAGNOSIS — D509 Iron deficiency anemia, unspecified: Secondary | ICD-10-CM | POA: Diagnosis present

## 2017-11-11 DIAGNOSIS — R791 Abnormal coagulation profile: Secondary | ICD-10-CM | POA: Diagnosis present

## 2017-11-11 DIAGNOSIS — E785 Hyperlipidemia, unspecified: Secondary | ICD-10-CM | POA: Diagnosis present

## 2017-11-11 DIAGNOSIS — N179 Acute kidney failure, unspecified: Secondary | ICD-10-CM | POA: Diagnosis present

## 2017-11-11 DIAGNOSIS — I10 Essential (primary) hypertension: Secondary | ICD-10-CM

## 2017-11-11 DIAGNOSIS — Z79899 Other long term (current) drug therapy: Secondary | ICD-10-CM

## 2017-11-11 DIAGNOSIS — C785 Secondary malignant neoplasm of large intestine and rectum: Secondary | ICD-10-CM

## 2017-11-11 DIAGNOSIS — T451X5A Adverse effect of antineoplastic and immunosuppressive drugs, initial encounter: Secondary | ICD-10-CM | POA: Diagnosis present

## 2017-11-11 DIAGNOSIS — R197 Diarrhea, unspecified: Secondary | ICD-10-CM | POA: Diagnosis not present

## 2017-11-11 DIAGNOSIS — C189 Malignant neoplasm of colon, unspecified: Secondary | ICD-10-CM | POA: Diagnosis present

## 2017-11-11 DIAGNOSIS — H409 Unspecified glaucoma: Secondary | ICD-10-CM | POA: Diagnosis present

## 2017-11-11 DIAGNOSIS — D631 Anemia in chronic kidney disease: Secondary | ICD-10-CM | POA: Diagnosis present

## 2017-11-11 DIAGNOSIS — I129 Hypertensive chronic kidney disease with stage 1 through stage 4 chronic kidney disease, or unspecified chronic kidney disease: Secondary | ICD-10-CM | POA: Diagnosis present

## 2017-11-11 DIAGNOSIS — C180 Malignant neoplasm of cecum: Secondary | ICD-10-CM | POA: Diagnosis present

## 2017-11-11 DIAGNOSIS — I469 Cardiac arrest, cause unspecified: Secondary | ICD-10-CM | POA: Diagnosis not present

## 2017-11-11 DIAGNOSIS — E786 Lipoprotein deficiency: Secondary | ICD-10-CM | POA: Diagnosis not present

## 2017-11-11 DIAGNOSIS — Z8249 Family history of ischemic heart disease and other diseases of the circulatory system: Secondary | ICD-10-CM | POA: Diagnosis not present

## 2017-11-11 DIAGNOSIS — E860 Dehydration: Secondary | ICD-10-CM | POA: Diagnosis present

## 2017-11-11 DIAGNOSIS — K521 Toxic gastroenteritis and colitis: Secondary | ICD-10-CM | POA: Diagnosis present

## 2017-11-11 LAB — CBC
HEMATOCRIT: 24.2 % — AB (ref 39.0–52.0)
HEMOGLOBIN: 8.1 g/dL — AB (ref 13.0–17.0)
MCH: 28.6 pg (ref 26.0–34.0)
MCHC: 33.5 g/dL (ref 30.0–36.0)
MCV: 85.5 fL (ref 78.0–100.0)
PLATELETS: 236 10*3/uL (ref 150–400)
RBC: 2.83 MIL/uL — AB (ref 4.22–5.81)
RDW: 21.7 % — AB (ref 11.5–15.5)
WBC: 4.9 10*3/uL (ref 4.0–10.5)

## 2017-11-11 LAB — PROTIME-INR
INR: 4.7
Prothrombin Time: 43.9 seconds — ABNORMAL HIGH (ref 11.4–15.2)

## 2017-11-11 LAB — MAGNESIUM: MAGNESIUM: 2.3 mg/dL (ref 1.7–2.4)

## 2017-11-11 LAB — BASIC METABOLIC PANEL
Anion gap: 4 — ABNORMAL LOW (ref 5–15)
BUN: 23 mg/dL — ABNORMAL HIGH (ref 6–20)
CALCIUM: 8.2 mg/dL — AB (ref 8.9–10.3)
CO2: 17 mmol/L — ABNORMAL LOW (ref 22–32)
CREATININE: 1.6 mg/dL — AB (ref 0.61–1.24)
Chloride: 117 mmol/L — ABNORMAL HIGH (ref 101–111)
GFR calc non Af Amer: 41 mL/min — ABNORMAL LOW (ref >60)
GFR, EST AFRICAN AMERICAN: 48 mL/min — AB (ref >60)
Glucose, Bld: 118 mg/dL — ABNORMAL HIGH (ref 65–99)
Potassium: 3.5 mmol/L (ref 3.5–5.1)
SODIUM: 138 mmol/L (ref 135–145)

## 2017-11-11 LAB — PHOSPHORUS: PHOSPHORUS: 1.7 mg/dL — AB (ref 2.5–4.6)

## 2017-11-11 LAB — C DIFFICILE QUICK SCREEN W PCR REFLEX
C Diff antigen: NEGATIVE
C Diff interpretation: NOT DETECTED
C Diff toxin: NEGATIVE

## 2017-11-11 MED ORDER — PROMETHAZINE HCL 25 MG PO TABS
12.5000 mg | ORAL_TABLET | Freq: Four times a day (QID) | ORAL | Status: DC | PRN
  Administered 2017-11-11 (×2): 12.5 mg via ORAL
  Filled 2017-11-11 (×2): qty 1

## 2017-11-11 MED ORDER — WARFARIN - PHARMACIST DOSING INPATIENT: Freq: Every day | Status: DC

## 2017-11-11 MED ORDER — PROMETHAZINE HCL 25 MG/ML IJ SOLN
12.5000 mg | Freq: Once | INTRAMUSCULAR | Status: AC
  Administered 2017-11-11: 12.5 mg via INTRAVENOUS
  Filled 2017-11-11: qty 1

## 2017-11-11 MED ORDER — BOOST / RESOURCE BREEZE PO LIQD CUSTOM
1.0000 | Freq: Three times a day (TID) | ORAL | Status: DC
  Administered 2017-11-11 – 2017-11-12 (×3): 1 via ORAL

## 2017-11-11 MED ORDER — PROMETHAZINE HCL 25 MG/ML IJ SOLN
12.5000 mg | Freq: Once | INTRAMUSCULAR | Status: DC
  Filled 2017-11-11: qty 1

## 2017-11-11 MED ORDER — DIPHENOXYLATE-ATROPINE 2.5-0.025 MG PO TABS
1.0000 | ORAL_TABLET | Freq: Three times a day (TID) | ORAL | Status: DC
  Administered 2017-11-11 – 2017-11-12 (×2): 1 via ORAL
  Filled 2017-11-11 (×2): qty 1

## 2017-11-11 MED ORDER — LORAZEPAM 2 MG/ML IJ SOLN
1.0000 mg | Freq: Once | INTRAMUSCULAR | Status: AC
  Administered 2017-11-12: 1 mg via INTRAVENOUS
  Filled 2017-11-11: qty 1

## 2017-11-11 NOTE — Progress Notes (Signed)
Initial Nutrition Assessment  INTERVENTION:   Provide Boost Breeze po TID, each supplement provides 250 kcal and 9 grams of protein Encourage PO intake  NUTRITION DIAGNOSIS:   Increased nutrient needs related to cancer and cancer related treatments as evidenced by estimated needs.  GOAL:   Patient will meet greater than or equal to 90% of their needs  MONITOR:   PO intake, Supplement acceptance, Weight trends, Labs, I & O's  REASON FOR ASSESSMENT:   Malnutrition Screening Tool    ASSESSMENT:   72 y.o. male with medical history significant of colon cancer on chemo last session dec 31 comes in with 3 days of persistent n/v/d and not able to keep much down. Reports diarrhea with 5-6 bowel movements yesterday. Admitted due to poor intake and dehydration.  Patient in room with no family at bedside. Pt lying in bed and throughout encounter kept his eyes shut. Very minimal answers to questions were given. Pt states yes to not eating well. Per chart, pt did not eat for 2 weeks PTA. Pt states yes to having N/V. Currently no nauseous but is still having diarrhea.  Pt doesn't like Ensure supplements. Is willing to try Boost Breeze supplements.  Per chart review, pt has lost 20 lb since 11/20 (8% wt loss x 1.5 months, significant for time frame).     Medications: Imodium capsule PRN, Zofran PRN, Phenergan PRN Labs reviewed: Low Phos Mg WNL GFR: 41   NUTRITION - FOCUSED PHYSICAL EXAM:  Nutrition focused physical exam shows no sign of depletion of muscle mass or body fat.  Diet Order:  Diet full liquid Room service appropriate? Yes; Fluid consistency: Thin  EDUCATION NEEDS:   No education needs have been identified at this time  Skin:  Skin Assessment: Reviewed RN Assessment  Last BM:  1/7  Height:   Ht Readings from Last 1 Encounters:  11/05/2017 6' 2.5" (1.892 m)    Weight:   Wt Readings from Last 1 Encounters:  11/07/2017 236 lb 5.3 oz (107.2 kg)    Ideal Body  Weight:  89.1 kg  BMI:  Body mass index is 29.94 kg/m.  Estimated Nutritional Needs:   Kcal:  2400-2600  Protein:  120-130g  Fluid:  2.4L/day  Clayton Bibles, MS, RD, LDN [place] [facility] Pager: [phone] After Hours Pager: [phone]

## 2017-11-11 NOTE — Telephone Encounter (Signed)
Returned call to daughter-in-law. Per Dr. Benay Spice, informed that MD will be visiting patient around 7am tomorrow morning if they would like to be present to ask questions. Daughter in law voiced understanding and agreement.

## 2017-11-11 NOTE — Progress Notes (Signed)
[facility] for Warfarin Indication: atrial fibrillation  No Known Allergies  Patient Measurements: Height: 6' 2.5" (189.2 cm) Weight: 236 lb 5.3 oz (107.2 kg) IBW/kg (Calculated) : 83.35  Vital Signs: Temp: 97.8 F (36.6 C) (01/07 0533) Temp Source: Oral (01/07 0533) BP: 92/56 (01/07 0533) Pulse Rate: 81 (01/07 0533)  Labs: Recent Labs    11/25/2017 1232 11/10/17 0500 11/10/17 0800 11/11/17 0530  HGB 8.7* 7.9*  --  8.1*  HCT 26.2* 23.8*  --  24.2*  PLT 235 204  --  236  LABPROT  --   --  50.0* 43.9*  INR  --   --  5.55* 4.70*  CREATININE 2.08* 1.79*  --  1.60*   Estimated Creatinine Clearance: 54 mL/min (A) (by C-G formula based on SCr of 1.6 mg/dL (H)).  Medical History: Past Medical History:  Diagnosis Date  . Anxiety   . Cancer ([place])    colon  . Dysrhythmia     a fib  . Hyperlipidemia   . Hypertension    managed  . Pneumonia 1950   Medications:  Scheduled:  . atorvastatin  80 mg Oral QPM  . chlorhexidine  15 mL Mouth Rinse BID  . dorzolamide-timolol  1 drop Both Eyes BID  . mouth rinse  15 mL Mouth Rinse q12n4p  . metoprolol tartrate  100 mg Oral BID   Assessment: 20 yoM to ED 1/5 with 3 days N/V. Hx of colon Ca on chemotherapy, last cycle 12/31. Hx of Afib on Warfarin 1mg  daily, on hold since 1/2 per patient. Last INR 5.14 on 12/31. Planned appt at [facility] 1/7, Pharmacy will monitor Warfarin  Today, 11/11/17  INR continues to be supratherapeutic - vitamin K 2.5mg  PO given 1/6  Hgb 8.1 but stable. Plts stable  No reported bleeding  Goal of Therapy:  INR 2-3 Monitor platelets by anticoagulation protocol: Yes   Plan:  1) Continue to hold warfarin today 2) Daily INR   Adrian Saran, PharmD, BCPS Pager [phone] 11/11/2017 8:53 AM

## 2017-11-11 NOTE — Progress Notes (Signed)
IP PROGRESS NOTE  Subjective:   [name] completed another treatment with 5-FU/leucovorin 11/04/2017.  He was admitted 11/28/2017 with nausea/vomiting and diarrhea.  He continues to have diarrhea.  He reports approximately 6-7 loose stools yesterday.  Nausea last night was relieved with Phenergan.  He otherwise feels well.  Objective: Vital signs in last 24 hours: Blood pressure (!) 92/56, pulse 81, temperature 97.8 F (36.6 C), temperature source Oral, resp. rate 16, height 6' 2.5" (1.892 m), weight 236 lb 5.3 oz (107.2 kg), SpO2 100 %.  Intake/Output from previous day: 01/06 0701 - 01/07 0700 In: 1954 [P.O.:1954] Out: -   Physical Exam:  HEENT: No thrush or ulcers Lungs: Clear bilaterally Cardiac: Irregular Abdomen: Soft, nontender, no hepatomegaly Extremities: No leg edema Skin: Palms and soles without erythema, dryness and skin thickening at the soles  Portacath/PICC-without erythema  Lab Results: Recent Labs    11/10/17 0500 11/11/17 0530  WBC 3.5* 4.9  HGB 7.9* 8.1*  HCT 23.8* 24.2*  PLT 204 236    BMET Recent Labs    11/10/17 0500 11/11/17 0530  NA 138 138  K 3.8 3.5  CL 115* 117*  CO2 17* 17*  GLUCOSE 118* 118*  BUN 23* 23*  CREATININE 1.79* 1.60*  CALCIUM 8.6* 8.2*    Lab Results  Component Value Date   CEA1 1.8 06/06/2017    Studies/Results: No results found.  Medications: I have reviewed the patient's current medications.  Assessment/Plan: 1. Colon cancer-synchronous transverse and ascending colon masses, and multiple polyps noted on colonoscopy 04/25/2017 ? 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 Staging CT 04/25/2017 with synchronous masses at the cecum and transverse colon with evidence of locally metastatic lymph node/direct extension of tumor ? Extended right colectomy 06/06/2017, moderately differentiated adenocarcinoma of  the cecum (pT3,pNo), G2-G3 adenocarcinoma of the transverse colon (pT4a,pN1a) ? Cycle 1 FOLFOX 07/03/2017 ? Cycle 2 FOLFOX 07/17/2017 (oxaliplatin held due to mild neutropenia) ? Cycle 3 FOLFOX 07/31/2017 ? Cycle 4 FOLFOX 08/14/2017(Neulasta added) ? Cycle 5 FOLFOX 08/28/2017 ? Cycle 1 weekly 5-FU/leucovorin 10/07/2017  2. Multiple polyps noted in the descending, sigmoid, and transverse colon on colonoscopy 04/25/2017-biopsy of a transverse colon polyp revealed low and high-grade dysplasia, other polyps returned as hyperplastic polyps and fragments of a tubular adenoma  3. History of iron deficiency anemia secondary to #1  4. Anxiety  5. Hypertension  6. Glaucoma  7. Atrial fibrillation-maintained on Coumadin  8. Multiple low-density pancreas lesions noted on CT 04/25/2017-likely benign  9.Admission 09/11/2017 with dehydration-likely secondary to chemotherapy-induced enteritis  10.History of a supratherapeutic PT/INR secondary to poor dietary intake  11.History of hypokalemia, hypomagnesemia secondary to diarrhea  12.Admission 09/14/2017 through 09/24/2017 with sepsis/pneumonia, atrial fibrillation.  13.  Admission 11/29/2017 with nausea/vomiting, diarrhea, and dehydration  14.  Supratherapeutic PT/INR  15.  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  He is completing a course of adjuvant chemotherapy.  He was last treated with 5-FU/leucovorin 11/04/2017.  He has completed 5 weekly treatments with 5-FU/leucovorin and was scheduled for treatment today.  He is admitted with nausea and diarrhea.  It is possible his symptoms are related to chemotherapy.  I recommend checking a stool sample for C. difficile.  The PT/INR is supratherapeutic secondary to altered nutrition.  He has severe anemia secondary to chronic disease and chemotherapy.  Recommendations: 1.  Record number of  stools per day, I discussed with the RN 2.  Add Lomotil for diarrhea 3.  Hold Coumadin, follow-up PT/INR 4.  Phenergan as needed for nausea 5.  Check stool for C. Difficile 6.  Outpatient follow-up will be rescheduled at the Cancer center.       LOS: 0 days   Betsy Coder, MD   11/11/2017, 8:34 AM

## 2017-11-11 NOTE — Progress Notes (Signed)
PROGRESS NOTE  [name] NWG:[id number] DOB: [birth year] DOA: 12/04/2017 PCP: [name], MD  HPI/Recap of past 24 hours: [name] is a 72 y.o. male with medical history significant of colon cancer on chemo last session dec 31 comes in with 3 days of persistent n/v/d and not able to keep much down. Reports diarrhea with 5-6 bowel movements yesterday. Admitted due to poor intake and dehydration.  Subjective  -afebrile, no CP, no SOB, no abd pain. Patient continue to have diarrhea and nausea. Not feeling good overall and continue to have poor appetite.    Assessment/Plan: Principal Problem:   Chemotherapy-induced nausea and vomiting Active Problems:   Colon cancer (HCC)   Essential hypertension   Nausea vomiting and diarrhea   Dehydration   AKI (acute kidney injury) (HCC)  Chemotherapy-induced nausea and vomiting - continue IVF's - continue as needed antiemetics ( zofran and phenergan) - will follow clinical response and continue supportive care.  Diarrhea- most likely chemo related -C. Diff neg -will start patient on lomotil and continue supportive care.    Colon cancer ([place])- noted -will follow Dr. Benay Spice rec's  Chronic a-fib on coumadin -pharmacy consulted for coumadin management -INR 4.70 -vitamin k po 2.5 mg once -follow INR trend   Supratherapeutic INR -pharmacy helping managing coumadin -no sign of overt bleeding -will follow CBC and INR trend; trending down -no coumadin today.    Essential hypertension -stable and well control -continue current antihypertensive regimen     Dehydration 2/2 to N,V and diarrhea -continue IVF's, supportive care and PRN antiemetics -C. 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 C. Diff neg, will start patient on lomotil and continue PRN antiemetics.    Consultants:  Consulted oncology (Dr. Learta Codding)  Procedures:  none  Antimicrobials:  none  DVT prophylaxis:  SCDs- INR 4.7 (11/11/17)   Objective: Vitals:   11/11/17 0533 11/11/17 1006 11/11/17 1432 11/11/17 2048  BP: (!) 92/56 117/71 (!) 113/57 125/65  Pulse: 81 91 85 87  Resp: 16  16 18   Temp: 97.8 F (36.6 C)  99 F (37.2 C) 98 F (36.7 C)  TempSrc: Oral  Oral Oral  SpO2: 100%  100% 100%  Weight:      Height:        Intake/Output Summary (Last 24 hours) at 11/11/2017 2150 Last data filed at 11/11/2017 1652 Gross per 24 hour  Intake 1432 ml  Output 1 ml  Net 1431 ml   Filed Weights   11/29/2017 1124 11/23/2017 2041  Weight: 108.9 kg (240 lb) 107.2 kg (236 lb 5.3 oz)    Exam:   General: afebrile, no SOB. Reported to be nauseated, not feeling good and with ongoing diarrhea.   Cardiovascular: irregular, no rubs, no gallops, no JVD  Respiratory: good air movement, bilaterally, no wheezing.   Abdomen: soft, NT, ND, positive BS.  Musculoskeletal: no edema, no cyanosis  Skin: no open wounds, no induration  Psychiatry: stable mood,  no SI and no hallucinations, AAOx3   Data Reviewed: CBC: Recent Labs  Lab 11/27/2017 1232 11/10/17 0500 11/11/17 0530  WBC 4.6 3.5* 4.9  HGB 8.7* 7.9* 8.1*  HCT 26.2* 23.8* 24.2*  MCV 84.8 84.7 85.5  PLT 235 204 803   Basic Metabolic Panel: Recent Labs  Lab 11/26/2017 1232 11/13/2017 1808 11/10/17 0500 11/11/17 0530  NA 137  --  138 138  K 2.9*  --  3.8 3.5  CL 111  --  115* 117*  CO2 18*  --  17* 17*  GLUCOSE 135*  --  118* 118*  BUN 22*  --  23* 23*   CREATININE 2.08*  --  1.79* 1.60*  CALCIUM 8.9  --  8.6* 8.2*  MG  --  1.3*  --  2.3  PHOS  --   --   --  1.7*   GFR: Estimated Creatinine Clearance: 54 mL/min (A) (by C-G formula based on SCr of 1.6 mg/dL (H)).   Liver Function Tests: Recent Labs  Lab 12/01/2017 1232  AST 16  ALT 9*  ALKPHOS 68  BILITOT 2.2*  PROT 6.4*  ALBUMIN 2.9*   Recent Labs  Lab 11/24/2017 1232  LIPASE 18   Coagulation Profile: Recent Labs  Lab 11/10/17 0800 11/11/17 0530  INR 5.55* 4.70*   Urine analysis:    Component Value Date/Time   COLORURINE AMBER (A) 09/11/2017 2115   APPEARANCEUR CLOUDY (A) 09/11/2017 2115   LABSPEC 1.025 09/11/2017 2115   PHURINE 5.0 09/11/2017 2115   GLUCOSEU NEGATIVE 09/11/2017 2115   HGBUR NEGATIVE 09/11/2017 2115   BILIRUBINUR NEGATIVE 09/11/2017 2115   [place] NEGATIVE 09/11/2017 2115   PROTEINUR 100 (A) 09/11/2017 2115   NITRITE NEGATIVE 09/11/2017 2115   LEUKOCYTESUR NEGATIVE 09/11/2017 2115    Recent Results (from the past 240 hour(s))  C difficile quick scan w PCR reflex     Status: None   Collection Time: 11/11/17  9:01 AM  Result Value Ref Range Status   C Diff antigen NEGATIVE NEGATIVE Final   C Diff toxin NEGATIVE NEGATIVE Final   C Diff interpretation No C. difficile detected.  Final      Studies: No results found.  Scheduled Meds: . atorvastatin  80 mg Oral QPM  . chlorhexidine  15 mL Mouth Rinse BID  . diphenoxylate-atropine  1 tablet Oral TID  . dorzolamide-timolol  1 drop Both Eyes BID  . feeding supplement  1 Container Oral TID BM  . mouth rinse  15 mL Mouth Rinse q12n4p  . metoprolol tartrate  100 mg Oral BID  . Warfarin - Pharmacist Dosing Inpatient   Does not apply q1800    Continuous Infusions: . sodium chloride       LOS: 0 days     Barton Dubois, MD [facility] Pager [phone]  If 7PM-7AM, please contact night-coverage [email or web address] Password [email or web address] 11/11/2017, 9:50 PM

## 2017-11-11 NOTE — Telephone Encounter (Signed)
Daughter in law called requesting to speak with Dr. Benay Spice regarding the plan for her father [name]. Wants to speak "before discharge". Call back number at [phone]. Dr. Benay Spice made aware.

## 2017-11-11 NOTE — Care Management Obs Status (Signed)
[place] NOTIFICATION   Patient Details  Name: [name] MRN: [id number] Date of Birth: 07/06/1946   Medicare Observation Status Notification Given:  Yes    Lynnell Catalan, RN 11/11/2017, 2:34 PM

## 2017-11-12 DIAGNOSIS — D6481 Anemia due to antineoplastic chemotherapy: Secondary | ICD-10-CM

## 2017-11-12 DIAGNOSIS — R197 Diarrhea, unspecified: Secondary | ICD-10-CM

## 2017-11-12 LAB — BASIC METABOLIC PANEL
Anion gap: 7 (ref 5–15)
BUN: 26 mg/dL — AB (ref 6–20)
CHLORIDE: 115 mmol/L — AB (ref 101–111)
CO2: 15 mmol/L — AB (ref 22–32)
CREATININE: 1.99 mg/dL — AB (ref 0.61–1.24)
Calcium: 8.4 mg/dL — ABNORMAL LOW (ref 8.9–10.3)
GFR calc Af Amer: 37 mL/min — ABNORMAL LOW (ref >60)
GFR calc non Af Amer: 32 mL/min — ABNORMAL LOW (ref >60)
GLUCOSE: 148 mg/dL — AB (ref 65–99)
Potassium: 3.4 mmol/L — ABNORMAL LOW (ref 3.5–5.1)
SODIUM: 137 mmol/L (ref 135–145)

## 2017-11-12 LAB — PROTIME-INR
INR: 5.85
Prothrombin Time: 52.1 seconds — ABNORMAL HIGH (ref 11.4–15.2)

## 2017-11-12 MED ORDER — PROMETHAZINE HCL 25 MG PO TABS: 25.0000 mg | ORAL_TABLET | Freq: Four times a day (QID) | ORAL | Status: DC | PRN

## 2017-11-12 MED ORDER — DIPHENOXYLATE-ATROPINE 2.5-0.025 MG PO TABS
2.0000 | ORAL_TABLET | Freq: Three times a day (TID) | ORAL | Status: DC
  Administered 2017-11-12 – 2017-11-13 (×5): 2 via ORAL
  Filled 2017-11-12 (×5): qty 2

## 2017-11-12 MED ORDER — PROMETHAZINE HCL 25 MG/ML IJ SOLN: 12.5000 mg | Freq: Three times a day (TID) | INTRAMUSCULAR | Status: DC | PRN

## 2017-11-12 MED ORDER — PROMETHAZINE HCL 25 MG/ML IJ SOLN
12.5000 mg | Freq: Once | INTRAMUSCULAR | Status: AC
  Administered 2017-11-12: 12.5 mg via INTRAVENOUS
  Filled 2017-11-12: qty 1

## 2017-11-12 NOTE — Progress Notes (Signed)
Pt had significant nausea and vomiting last night.

## 2017-11-12 NOTE — Progress Notes (Signed)
PROGRESS NOTE  EPIC TRIBBETT GEX:[id number] DOB: [birth year] DOA: 11/18/2017 PCP: [name], MD  HPI/Recap of past 24 hours: [name] is a 72 y.o. male with medical history significant of colon cancer on chemo last session dec 31 comes in with 3 days of persistent n/v/d and not able to keep much down. Reports diarrhea with 5-6 bowel movements yesterday. Admitted due to poor intake and dehydration.  Subjective  -afebrile, no CP, no SOB, no abd pain. Patient continue to have diarrhea and nausea. Not feeling good overall and continue to have poor appetite.    Assessment/Plan: Principal Problem:   Chemotherapy-induced nausea and vomiting Active Problems:   Colon cancer (HCC)   Essential hypertension   Nausea vomiting and diarrhea   Dehydration   AKI (acute kidney injury) (HCC)  Chemotherapy-induced nausea and vomiting -will continue IVF's -continue antiemetics as needed  -follow clinical response and improvement of symptoms prior to talk about discharge -wife is in the hospital and patient most likely unsafe to go home alone; PT ordered and CM contacted.    Diarrhea- most likely chemo related -C. Diff neg -Continue lomotil and continue supportive care.    Colon cancer ([place])- noted -will follow Dr. Benay Spice rec's  Chronic a-fib on coumadin -pharmacy consulted for coumadin management -CHADsVasc score 4 -INR 5.85 today  -vitamin k po 2.5 mg once on the moment of admission  -follow INR trend   Supratherapeutic INR -pharmacy helping managing coumadin -no sign of overt bleeding -INR 5.85 today (despite holding coumadin yesterday) -no coumadin today     Essential hypertension -stable and well control -continue current antihypertensive regimen  -follow VS and adjust antihypertensive regimen as needed     Dehydration 2/2 to N,V and diarrhea -continue IVF's, supportive care and PRN antiemetics -C. 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 C. Diff neg, will continue patient on lomotil and continue PRN antiemetics.    Consultants:  Consulted oncology (Dr. Learta Codding)  Procedures:  none  Antimicrobials:  none  DVT prophylaxis:  SCDs- INR 5.85 (11/12/17)   Objective: Vitals:   11/11/17 2048 11/12/17 0021 11/12/17 0449 11/12/17 0509  BP: 125/65  (!) 160/81 (!) 145/83  Pulse: 87  (!) 101 78  Resp: 18  18 18   Temp: 98 F (36.7 C)  98.2 F (36.8 C)   TempSrc: Oral  Axillary   SpO2: 100%  98%   Weight:  106.1 kg (234 lb)    Height:        Intake/Output Summary (Last 24 hours) at 11/12/2017 1149 Last data filed at 11/12/2017 0800 Gross per 24 hour  Intake 823.25 ml  Output 500 ml  Net 323.25 ml   Filed Weights   12/01/2017 1124 11/08/2017 2041 11/12/17 0021  Weight: 108.9 kg (240 lb) 107.2 kg (236 lb 5.3 oz) 106.1 kg (234 lb)  Exam:   General: no fever, not feeling good and continue to expressed nausea and diarrhea.   Cardiovascular: irregular, no rubs, no gallops, no JVD  Respiratory: good air movement, no wheezing, no crackles.  Abdomen: soft, NT, ND, positive BS.  Musculoskeletal: no edema, no cyanosis   Skin: no rash, no petechiae, no open wounds  Psychiatry: flat affect, no SI, no hallucinations. AAOX3, good judgement    Data Reviewed: CBC: Recent Labs  Lab  11/18/2017 1232 11/10/17 0500 11/11/17 0530  WBC 4.6 3.5* 4.9  HGB 8.7* 7.9* 8.1*  HCT 26.2* 23.8* 24.2*  MCV 84.8 84.7 85.5  PLT 235 204 413   Basic Metabolic Panel: Recent Labs  Lab 11/13/2017 1232 11/27/2017 1808 11/10/17 0500 11/11/17 0530 11/12/17 0758  NA 137  --  138 138 137  K 2.9*  --  3.8 3.5 3.4*  CL 111  --  115* 117* 115*  CO2 18*  --  17* 17* 15*  GLUCOSE 135*  --  118* 118* 148*  BUN 22*  --  23* 23* 26*  CREATININE 2.08*  --  1.79* 1.60* 1.99*  CALCIUM 8.9  --  8.6* 8.2* 8.4*  MG  --  1.3*  --  2.3  --   PHOS  --   --   --  1.7*  --    GFR: Estimated Creatinine Clearance: 43.3 mL/min (A) (by C-G formula based on SCr of 1.99 mg/dL (H)).   Liver Function Tests: Recent Labs  Lab 11/24/2017 1232  AST 16  ALT 9*  ALKPHOS 68  BILITOT 2.2*  PROT 6.4*  ALBUMIN 2.9*   Recent Labs  Lab 11/08/2017 1232  LIPASE 18   Coagulation Profile: Recent Labs  Lab 11/10/17 0800 11/11/17 0530 11/12/17 0436  INR 5.55* 4.70* 5.85*   Urine analysis:    Component Value Date/Time   COLORURINE AMBER (A) 09/11/2017 2115   APPEARANCEUR CLOUDY (A) 09/11/2017 2115   LABSPEC 1.025 09/11/2017 2115   PHURINE 5.0 09/11/2017 2115   GLUCOSEU NEGATIVE 09/11/2017 2115   HGBUR NEGATIVE 09/11/2017 2115   BILIRUBINUR NEGATIVE 09/11/2017 2115   [place] NEGATIVE 09/11/2017 2115   PROTEINUR 100 (A) 09/11/2017 2115   NITRITE NEGATIVE 09/11/2017 2115   LEUKOCYTESUR NEGATIVE 09/11/2017 2115    Recent Results (from the past 240 hour(s))  C difficile quick scan w PCR reflex     Status: None   Collection Time: 11/11/17  9:01 AM  Result Value Ref Range Status   C Diff antigen NEGATIVE NEGATIVE Final   C Diff toxin NEGATIVE NEGATIVE Final   C Diff interpretation No C. difficile detected.  Final      Studies: No results found.  Scheduled Meds: . atorvastatin  80 mg Oral QPM  . chlorhexidine  15 mL Mouth Rinse BID  . diphenoxylate-atropine  2 tablet Oral TID  .  dorzolamide-timolol  1 drop Both Eyes BID  . feeding supplement  1 Container Oral TID BM  . mouth rinse  15 mL Mouth Rinse q12n4p  . metoprolol tartrate  100 mg Oral BID  . promethazine  12.5 mg Intravenous Once  . Warfarin - Pharmacist Dosing Inpatient   Does not apply q1800    Continuous Infusions: . sodium chloride 75 mL/hr at 11/12/17 0323     LOS: 1 day     Barton Dubois, MD [facility] Pager [phone]  If 7PM-7AM, please contact night-coverage [email or web address] Password [facility] 11/12/2017, 11:49 AM

## 2017-11-12 NOTE — Evaluation (Signed)
Physical Therapy Evaluation Patient Details Name: [name] MRN: [id number] DOB: January 22, 1946 Today's Date: 11/12/2017   History of Present Illness  72 yo male admitted with Chemotherapy-induced nausea and vomiting. Hx of A fib, colon cancer currently on chemo, HTN.   Clinical Impression  Pt admitted with above diagnosis. Pt currently with functional limitations due to the deficits listed below (see PT Problem List).  Pt will benefit from skilled PT to increase their independence and safety with mobility to allow discharge to the venue listed below.  Pt reported odd dream prior to therapy arrival and stated he had accident in bed (urine and BM).  Pt assisted OOB over to recliner and changed gown.  NT in to perform linen change of bed.  Pt appears slightly confused, also states not hungry.  Pt with recent admission and declined SNF upon d/c however spouse now in hospital.  Recommend SNF upon d/c.     Follow Up Recommendations Supervision/Assistance - 24 hour;SNF    Equipment Recommendations  None recommended by PT    Recommendations for Other Services       Precautions / Restrictions Precautions Precautions: Fall      Mobility  Bed Mobility Overal bed mobility: Needs Assistance Bed Mobility: Supine to Sit     Supine to sit: Min guard;HOB elevated        Transfers Overall transfer level: Needs assistance Equipment used: Rolling walker (2 wheeled) Transfers: Sit to/from [facility] Sit to Stand: Min assist Stand pivot transfers: Min assist       General transfer comment: verbal cues for safety and sequence, pt had BM and urine on bed pads, able to stand holding RW while PT assisted with pericare quickly, pt then pivoted over to recliner, NT in to change linen and therapist requested barrier cream as buttocks and upper posterior thighs are red  Ambulation/Gait                Stairs            Wheelchair Mobility    Modified Rankin  (Stroke Patients Only)       Balance Overall balance assessment: Needs assistance         Standing balance support: Bilateral upper extremity supported Standing balance-Leahy Scale: Poor Standing balance comment: requires UE support                             Pertinent Vitals/Pain Pain Assessment: No/denies pain    Home Living Family/patient expects to be discharged to:: Private residence Living Arrangements: Spouse/significant other Available Help at Discharge: Available 24 hours/day;Family Type of Home: House Home Access: Stairs to enter Entrance Stairs-Rails: Psychiatric nurse of Steps: 3 Home Layout: One level Home Equipment: Walker - 2 wheels Additional Comments: information from previous admission, pt remains a little confused    Prior Function Level of Independence: Independent with assistive device(s)         Comments: recent admission - refused SNF and discharged home, spouse now in another hospital per chart review     Hand Dominance        Extremity/Trunk Assessment        Lower Extremity Assessment Lower Extremity Assessment: Generalized weakness       Communication   Communication: No difficulties  Cognition Arousal/Alertness: Awake/alert Behavior During Therapy: WFL for tasks assessed/performed Overall Cognitive Status: Impaired/Different from baseline  General Comments: pt able to state he was at [facility] however appears slightly confused      General Comments      Exercises     Assessment/Plan    PT Assessment Patient needs continued PT services  PT Problem List Decreased strength;Decreased mobility;Decreased safety awareness;Decreased activity tolerance;Decreased balance;Decreased knowledge of use of DME;Decreased cognition       PT Treatment Interventions Gait training;Therapeutic exercise;Patient/family education;Therapeutic activities;DME  instruction;Functional mobility training;Balance training    PT Goals (Current goals can be found in the Care Plan section)  Acute Rehab PT Goals PT Goal Formulation: With patient Time For Goal Achievement: 11/26/17 Potential to Achieve Goals: Fair    Frequency Min 3X/week   Barriers to discharge        Co-evaluation               AM-PAC PT "6 Clicks" Daily Activity  Outcome Measure Difficulty turning over in bed (including adjusting bedclothes, sheets and blankets)?: None Difficulty moving from lying on back to sitting on the side of the bed? : None Difficulty sitting down on and standing up from a chair with arms (e.g., wheelchair, bedside commode, etc,.)?: Unable Help needed moving to and from a bed to chair (including a wheelchair)?: A Little Help needed walking in hospital room?: A Lot Help needed climbing 3-5 steps with a railing? : A Lot 6 Click Score: 16    End of Session Equipment Utilized During Treatment: Gait belt Activity Tolerance: Patient tolerated treatment well Patient left: with call bell/phone within reach;in chair;with chair alarm set;with nursing/sitter in room Nurse Communication: Mobility status PT Visit Diagnosis: Other abnormalities of gait and mobility (R26.89)    Time: 2778-2423 PT Time Calculation (min) (ACUTE ONLY): 13 min   Charges:   PT Evaluation $PT Eval Low Complexity: 1 Low     PT G CodesCarmelia Bake, PT, DPT 11/12/2017 Pager: [phone]  York Ram E 11/12/2017, 5:02 PM

## 2017-11-12 NOTE — Progress Notes (Signed)
CRITICAL VALUE ALERT  Critical Value:  INR 5.85  Date & Time Notied:  0505 11/12/2017  Provider Notified: [place]  Orders Received/Actions taken: Awaiting orders

## 2017-11-12 NOTE — Progress Notes (Signed)
IP PROGRESS NOTE  Subjective:   [name] continues to have diarrhea and nausea.  He had multiple episodes of vomiting yesterday.  No pain.  Objective: Vital signs in last 24 hours: Blood pressure (!) 145/83, pulse 78, temperature 98.2 F (36.8 C), temperature source Axillary, resp. rate 18, height 6' 2.5" (1.892 m), weight 234 lb (106.1 kg), SpO2 98 %.  Intake/Output from previous day: 01/07 0701 - 01/08 0700 In: 1303.3 [P.O.:1197; I.V.:106.3] Out: 401 [Emesis/NG output:400; Stool:1]  Physical Exam:  HEENT: No thrush or ulcers, the tongue is dry Lungs: Bilateral expiratory wheeze Cardiac: Irregular Abdomen: Soft, nontender, no hepatomegaly, mildly distended Extremities: No leg edema Skin: Mild erythema at the distal fingers Neurologic: Alert, oriented to year.  Not oriented to day or place.  Portacath/PICC-without erythema  Lab Results: Recent Labs    11/10/17 0500 11/11/17 0530  WBC 3.5* 4.9  HGB 7.9* 8.1*  HCT 23.8* 24.2*  PLT 204 236    BMET Recent Labs    11/10/17 0500 11/11/17 0530  NA 138 138  K 3.8 3.5  CL 115* 117*  CO2 17* 17*  GLUCOSE 118* 118*  BUN 23* 23*  CREATININE 1.79* 1.60*  CALCIUM 8.6* 8.2*    Lab Results  Component Value Date   CEA1 1.8 06/06/2017    Medications: I have reviewed the patient's current medications.  Assessment/Plan: 1. Colon cancer-synchronous transverse and ascending colon masses, and multiple polyps noted on colonoscopy 04/25/2017 ? 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 Staging CT 04/25/2017 with synchronous masses at the cecum and transverse colon with evidence of locally metastatic lymph node/direct extension of tumor ? 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 Cycle 1 FOLFOX  07/03/2017 ? Cycle 2 FOLFOX 07/17/2017 (oxaliplatin held due to mild neutropenia) ? Cycle 3 FOLFOX 07/31/2017 ? Cycle 4 FOLFOX 08/14/2017(Neulasta added) ? Cycle 5 FOLFOX 08/28/2017 ? Cycle 1 weekly 5-FU/leucovorin 10/07/2017  2. Multiple polyps noted in the descending, sigmoid, and transverse colon on colonoscopy 04/25/2017-biopsy of a transverse colon polyp revealed low and high-grade dysplasia, other polyps returned as hyperplastic polyps and fragments of a tubular adenoma  3. History of iron deficiency anemia secondary to #1  4. Anxiety  5. Hypertension  6. Glaucoma  7. Atrial fibrillation-maintained on Coumadin  8. Multiple low-density pancreas lesions noted on CT 04/25/2017-likely benign  9.Admission 09/11/2017 with dehydration-likely secondary to chemotherapy-induced enteritis  10.History of a supratherapeutic PT/INR secondary to poor dietary intake  11.History of hypokalemia, hypomagnesemia secondary to diarrhea  12.Admission 09/14/2017 through 09/24/2017 with sepsis/pneumonia, atrial fibrillation.  13.  Admission 11/13/2017 with nausea/vomiting, diarrhea, and dehydration  14.  Supratherapeutic PT/INR, status post vitamin K  15.  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  He is completing a course of adjuvant chemotherapy.  He was last treated with 5-FU/leucovorin 11/04/2017.  He has completed 5 weekly treatments with 5-FU/leucovorin after completing 5 cycles of FOLFOX.  He has tolerated the chemotherapy poorly.  I do not recommend further chemotherapy.  He continues to have nausea and diarrhea.  The GI symptoms are most likely related to chemotherapy enteritis, but this degree of nausea/vomiting is unusual.  The C. difficile returned negative.  We will add tincture of opium or octreotide if the diarrhea persists after today.  The PT/INR remains  supratherapeutic.  I discussed the situation with his daughter this morning.  She indicates [name] his wife has been hospitalized in [place] with metastatic breast cancer.  [name] is unable to live alone at discharge.  He will need short-term SNF placement.  [name] is resistant to placement.  He appears confused this morning.  This is likely related to Ativan and Phenergan he received during the night.  Recommendations: 1.  Continue intravenous hydration and Imodium/Lomotil 2.  Hold Coumadin, follow PT/INR 3.  Physical therapy evaluation 4.  Care management consult       LOS: 1 day   Frederick Coder, MD   11/12/2017, 8:11 AM

## 2017-11-12 NOTE — Progress Notes (Signed)
[name] for Warfarin Indication: atrial fibrillation  No Known Allergies  Patient Measurements: Height: 6' 2.5" (189.2 cm) Weight: 234 lb (106.1 kg) IBW/kg (Calculated) : 83.35  Vital Signs: Temp: 98.2 F (36.8 C) (01/08 0449) Temp Source: Axillary (01/08 0449) BP: 145/83 (01/08 0509) Pulse Rate: 78 (01/08 0509)  Labs: Recent Labs    11/30/2017 1232 11/10/17 0500 11/10/17 0800 11/11/17 0530 11/12/17 0436  HGB 8.7* 7.9*  --  8.1*  --   HCT 26.2* 23.8*  --  24.2*  --   PLT 235 204  --  236  --   LABPROT  --   --  50.0* 43.9* 52.1*  INR  --   --  5.55* 4.70* 5.85*  CREATININE 2.08* 1.79*  --  1.60*  --    Estimated Creatinine Clearance: 53.8 mL/min (A) (by C-G formula based on SCr of 1.6 mg/dL (H)).  Medical History: Past Medical History:  Diagnosis Date  . Anxiety   . Cancer ([place])    colon  . Dysrhythmia     a fib  . Hyperlipidemia   . Hypertension    managed  . Pneumonia 1950   Medications:  Scheduled:  . atorvastatin  80 mg Oral QPM  . chlorhexidine  15 mL Mouth Rinse BID  . diphenoxylate-atropine  1 tablet Oral TID  . dorzolamide-timolol  1 drop Both Eyes BID  . feeding supplement  1 Container Oral TID BM  . mouth rinse  15 mL Mouth Rinse q12n4p  . metoprolol tartrate  100 mg Oral BID  . promethazine  12.5 mg Intravenous Once  . Warfarin - Pharmacist Dosing Inpatient   Does not apply q1800   Assessment: 30 yoM to ED 1/5 with 3 days N/V. Hx of colon Ca on chemotherapy, last cycle 12/31. Hx of Afib on Warfarin 1mg  daily, on hold since 1/2 per patient. Last INR 5.14 on 12/31. Planned appt at [facility] 1/7, Pharmacy will monitor Warfarin  Today, 11/12/17  INR continues to be supratherapeutic - vitamin K 2.5mg  PO given 1/6  Hgb 8.1 but stable. Plts stable  No reported bleeding  Patient with continued diarrhea secondary to chemo with 5FU contributing to continued increase in INR  Goal of Therapy:  INR 2-3 Monitor  platelets by anticoagulation protocol: Yes   Plan:  1) Continue to hold warfarin today 2) Daily INR   Adrian Saran, PharmD, BCPS Pager [phone] 11/12/2017 8:29 AM

## 2017-11-13 DIAGNOSIS — E786 Lipoprotein deficiency: Secondary | ICD-10-CM

## 2017-11-13 LAB — PROTIME-INR
INR: 6.81
Prothrombin Time: 58.7 seconds — ABNORMAL HIGH (ref 11.4–15.2)

## 2017-11-13 MED ORDER — PROMETHAZINE HCL 25 MG PO TABS: 12.5000 mg | ORAL_TABLET | Freq: Four times a day (QID) | ORAL | Status: DC | PRN

## 2017-11-13 MED ORDER — PROMETHAZINE HCL 25 MG/ML IJ SOLN
6.2500 mg | Freq: Three times a day (TID) | INTRAMUSCULAR | Status: DC | PRN
  Administered 2017-11-13: 6.25 mg via INTRAVENOUS
  Filled 2017-11-13: qty 1

## 2017-11-13 MED ORDER — PHYTONADIONE 5 MG PO TABS
2.5000 mg | ORAL_TABLET | Freq: Once | ORAL | Status: DC
  Filled 2017-11-13: qty 1

## 2017-11-13 NOTE — Progress Notes (Signed)
Patient adamantly refused Vitamin K 2.5 mg PO for INR 6.81 stating that his cardiologist told him not to take anything for his INR, education given, patient continues to adamantly refuse Vitamin K.  MD aware via text page.

## 2017-11-13 NOTE — Clinical Social Work Note (Signed)
Clinical Social Work Assessment  Patient Details  Name: [name] MRN: [id number] Date of Birth: 01-Sep-1946  Date of referral:  11/13/17               Reason for consult:  Facility Placement                Permission sought to share information with:  Family Supports Permission granted to share information::  Yes, Verbal Permission Granted  Name::     son and daughter-in-law  Agency::     Relationship::     Contact Information:     Housing/Transportation Living arrangements for the past 2 months:  [place] of Information:  Patient, Adult Children, Medical Team Patient Interpreter Needed:  None Criminal Activity/Legal Involvement Pertinent to Current Situation/Hospitalization:  No - Comment as needed Significant Relationships:  Adult Children, Community Support Lives with:  Significant Other, Self Do you feel safe going back to the place where you live?  Yes Need for family participation in patient care:  No (Coment)  Care giving concerns:  Pt from home where he resides with his significant other of many years. He received chemotherapy treatment over the past few months and states it weakened him significantly to the point that SO was largely caring for all his ADLs. He did work with [facility] in November and December 2018 but daughter-in-law states he was not able to progress much with ambulation. Uses a walker when he can. Current care giving concern is that pt's SO "just found out that she has recurrence of breast cancer and is in the hospital herself at [facility]. So she will not be able to care for him when he goes home this time"- per daughter-in law   Social Worker assessment / plan:  CSW following to assist with disposition. Met with pt at bedside and daughter-in-law participate via phone. See concerns above re: care at DC.  Discussed with both the recommendation for SNF for 24/7 supervision. Both agreeable to referral process. Discussed that [facility]  may not authorize this due to pt not being far from baseline per their report and showing low potential for rehab right now. Both understanding and state if SNF is not option then they would plan to hire 24/7 caregivers at home. Completed FL2 and made referrals to area SNFs. Will follow up with bed offers.  Plan: SNF v home with 24/7 caregivers/HH.  Employment status:  Retired Nurse, adult PT Recommendations:  [place], [place] / Referral to community resources:  [place]  Patient/Family's Response to care:  Pt appreciative  Patient/Family's Understanding of and Emotional Response to Diagnosis, Current Treatment, and Prognosis:  Pt demonstrates adequate understanding of his treatment, plan, and barriers. Is sad emotionally, which is appropriate to situation given his current issues as well as SO's.  Emotional Assessment Appearance:  Appears stated age Attitude/Demeanor/Rapport:  (appropriate) Affect (typically observed):  Calm, Sad Orientation:  Oriented to Self, Oriented to Place, Oriented to  Time, Oriented to Situation Alcohol / Substance use:  Not Applicable Psych involvement (Current and /or in the community):  No (Comment)  Discharge Needs  Concerns to be addressed:  Discharge Planning Concerns Readmission within the last 30 days:  No Current discharge risk:  Dependent with Mobility Barriers to Discharge:  Continued Medical Work up, [employer], LCSW 11/13/2017, 10:09 AM  [phone]

## 2017-11-13 NOTE — Progress Notes (Signed)
Per [facility] rep, pt had [facility] home health services prior to admission. Pt was in [place] ([place] for Re-admission). [facility] rep following in case pt goes back home instead of SNF. Marney Doctor RN,BSn,NCM [phone]

## 2017-11-13 NOTE — Progress Notes (Signed)
This nurse was walking around doing rounds and noticed pt was sticking finger in mouth to make himself gag. Informed pt not to do that and to notify the nurse if he is feeling nauseas

## 2017-11-13 NOTE — Progress Notes (Signed)
[place] for Warfarin Indication: atrial fibrillation  No Known Allergies  Patient Measurements: Height: 6' 2.5" (189.2 cm) Weight: 233 lb 7.5 oz (105.9 kg) IBW/kg (Calculated) : 83.35  Vital Signs: Temp: 98 F (36.7 C) (01/09 0442) Temp Source: Oral (01/09 0442) BP: 144/86 (01/09 0442) Pulse Rate: 84 (01/09 0442)  Labs: Recent Labs    11/11/17 0530 11/12/17 0436 11/12/17 0758 11/13/17 0446  HGB 8.1*  --   --   --   HCT 24.2*  --   --   --   PLT 236  --   --   --   LABPROT 43.9* 52.1*  --  58.7*  INR 4.70* 5.85*  --  6.81*  CREATININE 1.60*  --  1.99*  --    Estimated Creatinine Clearance: 43.2 mL/min (A) (by C-G formula based on SCr of 1.99 mg/dL (H)).  Medical History: Past Medical History:  Diagnosis Date  . Anxiety   . Cancer ([place])    colon  . Dysrhythmia     a fib  . Hyperlipidemia   . Hypertension    managed  . Pneumonia 1950   Medications:  Scheduled:  . atorvastatin  80 mg Oral QPM  . chlorhexidine  15 mL Mouth Rinse BID  . diphenoxylate-atropine  2 tablet Oral TID  . dorzolamide-timolol  1 drop Both Eyes BID  . feeding supplement  1 Container Oral TID BM  . mouth rinse  15 mL Mouth Rinse q12n4p  . metoprolol tartrate  100 mg Oral BID  . promethazine  12.5 mg Intravenous Once  . Warfarin - Pharmacist Dosing Inpatient   Does not apply q1800   Assessment: 27 yoM to ED 1/5 with 3 days N/V. Hx of colon Ca on chemotherapy, last cycle 12/31. Hx of Afib on Warfarin 1mg  daily, on hold since 1/2 per patient. Last INR 5.14 on 12/31. Planned appt at [facility] 1/7, Pharmacy will monitor Warfarin  Today, 11/13/17  INR continues to be supratherapeutic - vitamin K 2.5mg  PO given 1/6  CBC 1/7: Hgb 8.1 but stable. Plts stable  No reported bleeding  Patient with continued diarrhea/poor PO intake secondary to chemo with 5FU contributing to continued increase in INR  Goal of Therapy:  INR 2-3 Monitor platelets by  anticoagulation protocol: Yes   Plan:  1) Continue to hold warfarin today 2) Daily INR   Peggyann Juba, PharmD, BCPS Pager: [phone] 11/13/2017 8:05 AM

## 2017-11-13 NOTE — Progress Notes (Signed)
PROGRESS NOTE  [name] VEL:[id number] DOB: [birth year] DOA: 11/25/2017 PCP: [name], MD  HPI/Recap of past 24 hours: [name] is a 72 y.o. male with medical history significant of colon cancer on chemo last session dec 31 comes in with 3 days of persistent n/v/d and not able to keep much down. Reports diarrhea with 5-6 bowel movements yesterday. Admitted due to poor intake and dehydration.  Subjective  Feeling a whole lot better, denies chest pain, no shortness of breath, no abdominal pain.  Significant improvement in his diarrhea and nausea.  Patient reported no further vomiting.  Assessment/Plan: Principal Problem:   Chemotherapy-induced nausea and vomiting Active Problems:   Colon cancer (HCC)   Essential hypertension   Nausea vomiting and diarrhea   Dehydration   AKI (acute kidney injury) (HCC)  Chemotherapy-induced nausea and vomiting -will continue IVF's -continue antiemetics as needed  -follow clinical response and improvement of symptoms prior to talk about discharge -wife is in the hospital and patient most likely unsafe to go home alone; PT ordered and CM contacted.    Diarrhea- most likely chemo related -C. Diff neg -Continue lomotil and continue supportive care.    Colon cancer ([place])- noted -will follow Dr. Benay Spice rec's  Chronic a-fib on coumadin -pharmacy consulted for coumadin management -CHADsVasc score 4 -INR 6.81 today  -vitamin k po 2.5 mg once on the moment of admission; will repeat dose today. -follow INR trend  -No Coumadin today.  Supratherapeutic INR -pharmacy helping managing coumadin -no sign of overt bleeding -INR 5.85 today (despite holding coumadin yesterday) -no coumadin today     Essential hypertension -stable and well control -continue current antihypertensive regimen  -follow VS and adjust antihypertensive regimen as needed     Dehydration 2/2 to N,V and diarrhea -continue IVF's, supportive care and PRN  antiemetics -C. 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  Anemia of chronic disease -baseline hg 10 -follows with hem/onc as an outpatient  -transfuse for Hgb less than 7 -no signs of acute bleeding appreciated -Will check CBC in a.m.  Hypomagnesemia and hypokalemia -Stable and within normal limits after repletion. -will monitor electrolytes trend and further replete as needed  -associated to GI loses    Code Status: full  Family Communication: no family members at bedside  Disposition Plan: To be determined.  Ongoing discussion with patient regarding recommendation for skilled nursing facility and lack of proper support at home at this moment.  His wife is also hospitalized.  Continue supportive care, continue IV fluids continue advancing diet.  Continue antiemetics.  Consultants:  Consulted oncology (Dr. Learta Codding)  Procedures:  none  Antimicrobials:  none  DVT prophylaxis:  SCDs- INR 6.81 (11/13/17)   Objective: Vitals:   11/12/17 2036 11/13/17 0104 11/13/17 0442 11/13/17 1517  BP: 126/83  (!) 144/86 117/69  Pulse: (!) 102  84 100  Resp: 18  18 18   Temp: 98 F (36.7 C)  98 F (36.7 C) 98 F (36.7 C)  TempSrc: Oral  Oral Oral  SpO2: 98%  100% 99%  Weight:  105.9 kg (233 lb 7.5 oz)    Height:        Intake/Output Summary (Last 24 hours) at 11/13/2017 1906 Last data filed at 11/13/2017 1518 Gross  per 24 hour  Intake 360 ml  Output -  Net 360 ml   Filed Weights   11/13/2017 2041 11/12/17 0021 11/13/17 0104  Weight: 107.2 kg (236 lb 5.3 oz) 106.1 kg (234 lb) 105.9 kg (233 lb 7.5 oz)    Exam:   General: Afebrile, feeling a whole lot better.  Reports improvement in his diarrhea and nausea; denies further vomiting.      Cardiovascular: Rate controlled, no rubs, no gallops, no JVD.  Respiratory: Good air movement bilaterally, no wheezing, no crackles; normal respiratory effort.    Abdomen: Soft, nontender, nondistended, positive bowel sounds.    Musculoskeletal: No edema, no cyanosis, no clubbing.  Skin: No rash, no petechiae, no open wounds on exam.  Psychiatry: Flat affect appreciated, no suicidal ideation, no hallucinations.  Patient remains oriented x3 with good judgment and insight.     Data Reviewed: CBC: Recent Labs  Lab 12/05/2017 1232 11/10/17 0500 11/11/17 0530  WBC 4.6 3.5* 4.9  HGB 8.7* 7.9* 8.1*  HCT 26.2* 23.8* 24.2*  MCV 84.8 84.7 85.5  PLT 235 204 509   Basic Metabolic Panel: Recent Labs  Lab 12/04/2017 1232 11/08/2017 1808 11/10/17 0500 11/11/17 0530 11/12/17 0758  NA 137  --  138 138 137  K 2.9*  --  3.8 3.5 3.4*  CL 111  --  115* 117* 115*  CO2 18*  --  17* 17* 15*  GLUCOSE 135*  --  118* 118* 148*  BUN 22*  --  23* 23* 26*  CREATININE 2.08*  --  1.79* 1.60* 1.99*  CALCIUM 8.9  --  8.6* 8.2* 8.4*  MG  --  1.3*  --  2.3  --   PHOS  --   --   --  1.7*  --    GFR: Estimated Creatinine Clearance: 43.2 mL/min (A) (by C-G formula based on SCr of 1.99 mg/dL (H)).   Liver Function Tests: Recent Labs  Lab 11/11/2017 1232  AST 16  ALT 9*  ALKPHOS 68  BILITOT 2.2*  PROT 6.4*  ALBUMIN 2.9*   Recent Labs  Lab 11/16/2017 1232  LIPASE 18   Coagulation Profile: Recent Labs  Lab 11/10/17 0800 11/11/17 0530 11/12/17 0436 11/13/17 0446  INR 5.55* 4.70* 5.85* 6.81*   Urine analysis:    Component Value Date/Time   COLORURINE AMBER (A) 09/11/2017 2115   APPEARANCEUR CLOUDY (A) 09/11/2017 2115   LABSPEC 1.025 09/11/2017 2115   PHURINE 5.0 09/11/2017 2115   GLUCOSEU NEGATIVE 09/11/2017 2115   HGBUR NEGATIVE 09/11/2017 2115   BILIRUBINUR NEGATIVE 09/11/2017 2115   [place] NEGATIVE 09/11/2017 2115   PROTEINUR 100 (A) 09/11/2017 2115   NITRITE NEGATIVE  09/11/2017 2115   LEUKOCYTESUR NEGATIVE 09/11/2017 2115    Recent Results (from the past 240 hour(s))  C difficile quick scan w PCR reflex     Status: None   Collection Time: 11/11/17  9:01 AM  Result Value Ref Range Status   C Diff antigen NEGATIVE NEGATIVE Final   C Diff toxin NEGATIVE NEGATIVE Final   C Diff interpretation No C. difficile detected.  Final      Studies: No results found.  Scheduled Meds: . atorvastatin  80 mg Oral QPM  . chlorhexidine  15 mL Mouth Rinse BID  . diphenoxylate-atropine  2 tablet Oral TID  . dorzolamide-timolol  1 drop Both Eyes BID  . feeding supplement  1 Container Oral TID BM  . mouth rinse  15 mL Mouth Rinse q12n4p  .  metoprolol tartrate  100 mg Oral BID  . promethazine  12.5 mg Intravenous Once  . Warfarin - Pharmacist Dosing Inpatient   Does not apply q1800    Continuous Infusions: . sodium chloride 75 mL/hr at 11/12/17 0323     LOS: 2 days     Barton Dubois, MD [facility] Pager [phone]  If 7PM-7AM, please contact night-coverage [email or web address] Password [email or web address] 11/13/2017, 7:06 PM

## 2017-11-13 NOTE — Progress Notes (Signed)
CRITICAL VALUE ALERT  Critical Value:  INR 6.81 Date & Time Notied:  01/9  0600  Provider Notified: [place]  Orders Received/Actions taken: Awaiting orders

## 2017-11-13 NOTE — NC FL2 (Signed)
[place] LEVEL OF CARE SCREENING TOOL     IDENTIFICATION  Patient Name: [name] Birthdate: 1946/03/25 Sex: male Admission Date (Current Location): 11/24/2017  [facility] and [place] Number:  Herbalist and Address:  [facility],  [address] [place], [place]      Provider Number: [id number]  Attending Physician Name and Address:  Barton Dubois, MD  Relative Name and Phone Number:       Current Level of Care: Hospital Recommended Level of Care: [facility] Prior Approval Number:    Date Approved/Denied:   PASRR Number: [id number]  Discharge Plan: SNF    Current Diagnoses: Patient Active Problem List   Diagnosis Date Noted  . Nausea vomiting and diarrhea 11/21/2017  . Chemotherapy-induced nausea and vomiting 11/11/2017  . Dehydration 11/05/2017  . AKI (acute kidney injury) ([place]) 11/15/2017  . Pressure injury of skin 09/16/2017  . Sepsis due to pneumonia ([place]) 09/14/2017  . HLD (hyperlipidemia) 09/11/2017  . Essential hypertension 09/11/2017  . Hyponatremia 09/11/2017  . ARF (acute renal failure) ([place]) 09/11/2017  . Leukocytosis 09/11/2017  . Port catheter in place 07/17/2017  . Colon cancer ([place]) 06/06/2017  . History of ventral hernia repair, 06/23/2013 07/10/2013  . Umbilical hernia 47/82/9562    Orientation RESPIRATION BLADDER Height & Weight     Self, Time, Situation, Place  Normal Continent Weight: 233 lb 7.5 oz (105.9 kg) Height:  6' 2.5" (189.2 cm)  BEHAVIORAL SYMPTOMS/MOOD NEUROLOGICAL BOWEL NUTRITION STATUS      Continent Diet(full liquid)  AMBULATORY STATUS COMMUNICATION OF NEEDS Skin   Extensive Assist Verbally (closed incision chest left)                       Personal Care Assistance Level of Assistance  Bathing, Feeding, Dressing Bathing Assistance: Maximum assistance Feeding assistance: Independent Dressing Assistance: Maximum assistance     Functional Limitations Info   Sight, Hearing, Speech Sight Info: Adequate Hearing Info: Adequate Speech Info: Adequate    SPECIAL CARE FACTORS FREQUENCY  PT (By licensed PT), OT (By licensed OT)     PT Frequency: 5x OT Frequency: 5x            Contractures Contractures Info: Not present    Additional Factors Info  Code Status, Allergies Code Status Info: full code Allergies Info: nka           Current Medications (11/13/2017):  This is the current hospital active medication list Current Facility-Administered Medications  Medication Dose Route Frequency Provider Last Rate Last Dose  . 0.9 %  sodium chloride infusion   Intravenous Continuous Kayleen Memos, DO 75 mL/hr at 11/12/17 0323    . ALPRAZolam Duanne Moron) tablet 0.5 mg  0.5 mg Oral TID PRN Phillips Grout, MD      . atorvastatin (LIPITOR) tablet 80 mg  80 mg Oral QPM Phillips Grout, MD   80 mg at 11/12/17 1857  . chlorhexidine (PERIDEX) 0.12 % solution 15 mL  15 mL Mouth Rinse BID Phillips Grout, MD   15 mL at 11/10/17 2114  . diphenoxylate-atropine (LOMOTIL) 2.5-0.025 MG per tablet 2 tablet  2 tablet Oral TID Barton Dubois, MD   2 tablet at 11/13/17 0929  . dorzolamide-timolol (COSOPT) 22.3-6.8 MG/ML ophthalmic solution 1 drop  1 drop Both Eyes BID Phillips Grout, MD   1 drop at 11/13/17 0934  . feeding supplement (BOOST / RESOURCE BREEZE) liquid 1 Container  1  Container Oral TID BM Barton Dubois, MD   1 Container at 11/12/17 1049  . loperamide (IMODIUM) capsule 2 mg  2 mg Oral Q6H PRN Phillips Grout, MD   2 mg at 11/11/17 2008  . MEDLINE mouth rinse  15 mL Mouth Rinse q12n4p Derrill Kay A, MD   15 mL at 11/10/17 1211  . metoprolol tartrate (LOPRESSOR) tablet 100 mg  100 mg Oral BID Derrill Kay A, MD   100 mg at 11/13/17 0928  . nystatin (MYCOSTATIN) 100000 UNIT/ML suspension 500,000 Units  5 mL Oral QID PRN Phillips Grout, MD      . ondansetron [facility]) injection 4 mg  4 mg Intravenous Q6H PRN Irene Pap N, DO   4 mg at 11/11/17 0834  .  ondansetron (ZOFRAN) tablet 8 mg  8 mg Oral Q8H PRN Phillips Grout, MD   8 mg at 11/10/17 1425  . promethazine (PHENERGAN) injection 12.5 mg  12.5 mg Intravenous Once Schorr, Rhetta Mura, NP      . promethazine (PHENERGAN) injection 6.25 mg  6.25 mg Intravenous Q8H PRN Barton Dubois, MD      . promethazine (PHENERGAN) tablet 12.5 mg  12.5 mg Oral Q6H PRN Ladell Pier, MD      . Warfarin - Pharmacist Dosing Inpatient   Does not apply [id number] Barton Dubois, MD   Stopped at 11/11/17 1800     Discharge Medications: Please see discharge summary for a list of discharge medications.  Relevant Imaging Results:  Relevant Lab Results:   Additional Information ssn:[phone]  Nila Nephew, LCSW

## 2017-11-13 NOTE — Progress Notes (Signed)
IP PROGRESS NOTE  Subjective:   [name] reports improvement in nausea and diarrhea.  He feels "weak ".  Objective: Vital signs in last 24 hours: Blood pressure (!) 144/86, pulse 84, temperature 98 F (36.7 C), temperature source Oral, resp. rate 18, height 6' 2.5" (1.892 m), weight 233 lb 7.5 oz (105.9 kg), SpO2 100 %.  Intake/Output from previous day: 01/08 0701 - 01/09 0700 In: 120 [P.O.:120] Out: 100 [Urine:100]  Physical Exam:  HEENT: No thrush or ulcers, the tongue is dry Lungs: Clear bilaterally, no respiratory distress Cardiac: Irregular Abdomen: Soft, nontender, no hepatomegaly, mildly distended Extremities: No leg edema Skin: Mild erythema at the distal fingers and toes, no skin breakdown Neurologic: Alert, oriented   Portacath/PICC-without erythema  Lab Results: Recent Labs    11/11/17 0530  WBC 4.9  HGB 8.1*  HCT 24.2*  PLT 236   PT/INR 6.81 BMET Recent Labs    11/11/17 0530 11/12/17 0758  NA 138 137  K 3.5 3.4*  CL 117* 115*  CO2 17* 15*  GLUCOSE 118* 148*  BUN 23* 26*  CREATININE 1.60* 1.99*  CALCIUM 8.2* 8.4*    Lab Results  Component Value Date   CEA1 1.8 06/06/2017    Medications: I have reviewed the patient's current medications.  Assessment/Plan: 1. Colon cancer-synchronous transverse and ascending colon masses, and multiple polyps noted on colonoscopy 04/25/2017 ? 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 Staging CT 04/25/2017 with synchronous masses at the cecum and transverse colon with evidence of locally metastatic lymph node/direct extension of tumor ? 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 Cycle 1 FOLFOX 07/03/2017 ? Cycle 2 FOLFOX 07/17/2017 (oxaliplatin held due to mild neutropenia) ? Cycle 3  FOLFOX 07/31/2017 ? Cycle 4 FOLFOX 08/14/2017(Neulasta added) ? Cycle 5 FOLFOX 08/28/2017 ? Cycle 1 weekly 5-FU/leucovorin 10/07/2017  2. Multiple polyps noted in the descending, sigmoid, and transverse colon on colonoscopy 04/25/2017-biopsy of a transverse colon polyp revealed low and high-grade dysplasia, other polyps returned as hyperplastic polyps and fragments of a tubular adenoma  3. History of iron deficiency anemia secondary to #1  4. Anxiety  5. Hypertension  6. Glaucoma  7. Atrial fibrillation-maintained on Coumadin  8. Multiple low-density pancreas lesions noted on CT 04/25/2017-likely benign  9.Admission 09/11/2017 with dehydration-likely secondary to chemotherapy-induced enteritis  10.History of a supratherapeutic PT/INR secondary to poor dietary intake  11.History of hypokalemia, hypomagnesemia secondary to diarrhea  12.Admission 09/14/2017 through 09/24/2017 with sepsis/pneumonia, atrial fibrillation.  13.  Admission 11/23/2017 with nausea/vomiting, diarrhea, and dehydration  14.  Supratherapeutic PT/INR, status post vitamin K  15.  Severe anemia secondary to chronic disease and chemotherapy   [name] continues to have a poor performance status.  The nausea and diarrhea have improved, but he has limited oral intake.  I encouraged him to participate with physical therapy and increase ambulation as tolerated.  He will likely need skilled nursing facility placement at discharge.  The PT INR remains supratherapeutic.  I will check a PT with a peripheral blood draw tomorrow.  Coumadin remains on hold.  Recommendations: 1.  Continue intravenous hydration and Imodium/Lomotil 2.  Hold Coumadin, repeat PT/INR with a peripheral draw on 11/20/17 3.  Out of bed physical therapy evaluation 4.  Care management consult for nursing facility placement       LOS: 2 days  Betsy Coder, MD   11/13/2017, 8:51 AM

## 2017-11-14 ENCOUNTER — Inpatient Hospital Stay (HOSPITAL_COMMUNITY): Payer: Medicare HMO | Admitting: Anesthesiology

## 2017-11-14 LAB — BASIC METABOLIC PANEL
Anion gap: 10 (ref 5–15)
BUN: 39 mg/dL — AB (ref 6–20)
CHLORIDE: 115 mmol/L — AB (ref 101–111)
CO2: 14 mmol/L — ABNORMAL LOW (ref 22–32)
Calcium: 8.4 mg/dL — ABNORMAL LOW (ref 8.9–10.3)
Creatinine, Ser: 2.65 mg/dL — ABNORMAL HIGH (ref 0.61–1.24)
GFR calc Af Amer: 26 mL/min — ABNORMAL LOW (ref >60)
GFR calc non Af Amer: 22 mL/min — ABNORMAL LOW (ref >60)
GLUCOSE: 217 mg/dL — AB (ref 65–99)
POTASSIUM: 3.7 mmol/L (ref 3.5–5.1)
Sodium: 139 mmol/L (ref 135–145)

## 2017-11-14 LAB — CBC
HEMATOCRIT: 30.4 % — AB (ref 39.0–52.0)
HEMOGLOBIN: 9.9 g/dL — AB (ref 13.0–17.0)
MCH: 28.3 pg (ref 26.0–34.0)
MCHC: 32.6 g/dL (ref 30.0–36.0)
MCV: 86.9 fL (ref 78.0–100.0)
Platelets: 388 10*3/uL (ref 150–400)
RBC: 3.5 MIL/uL — AB (ref 4.22–5.81)
RDW: 22.9 % — ABNORMAL HIGH (ref 11.5–15.5)
WBC: 6 10*3/uL (ref 4.0–10.5)

## 2017-11-14 LAB — PROTIME-INR
INR: 10.15
Prothrombin Time: 80.1 seconds — ABNORMAL HIGH (ref 11.4–15.2)

## 2017-11-14 MED FILL — Medication: Qty: 1 | Status: AC

## 2017-11-23 ENCOUNTER — Other Ambulatory Visit: Payer: Self-pay | Admitting: Nurse Practitioner

## 2017-11-24 DIAGNOSIS — Z9889 Other specified postprocedural states: Secondary | ICD-10-CM | POA: Diagnosis not present

## 2017-11-24 DIAGNOSIS — Z8719 Personal history of other diseases of the digestive system: Secondary | ICD-10-CM | POA: Diagnosis not present

## 2017-11-24 DIAGNOSIS — J189 Pneumonia, unspecified organism: Secondary | ICD-10-CM | POA: Diagnosis not present

## 2017-11-24 DIAGNOSIS — A419 Sepsis, unspecified organism: Secondary | ICD-10-CM | POA: Diagnosis not present

## 2017-11-24 DIAGNOSIS — R269 Unspecified abnormalities of gait and mobility: Secondary | ICD-10-CM | POA: Diagnosis not present

## 2017-12-06 NOTE — Discharge Summary (Addendum)
Death Summary  [name] AQT:[id number] DOB: 1946/04/15 DOA: 11/23/17  PCP: [name], MD PCP/Office notified: informed through Epic.  Admit date: 2017/11/23 Date of Death: November 28, 2017  Final Diagnoses:  Principal Problem:   Chemotherapy-induced nausea and vomiting Active Problems:   Colon cancer (HCC)   Essential hypertension   Nausea vomiting and diarrhea   Dehydration   AKI (acute kidney injury) on CKD stage 3 (HCC) anemia of chronic disease   History of present illness:  As per H&P written by Dr. Shanon Brow on Nov 23, 2017 72 y.o. male with medical history significant of colon cancer on chemo last session dec 31 comes in with 3 days of persistent n/v/d and not able to keep much down.  No fevers.  No abdominal pain.  No blood in vomit or diarrhea.  Pt ran out of his zofran 2 days ago.  Pt referred for admission for dehydration and assistance with symptoms management.  Hospital Course:   PEA cardiorespiratory arrest:  -most likely in the setting of aspiration -patient found around 7:02 on 11/11/17 in agonal breathing and with vomiting material around his mouth. Code blue was called. Patient resuscitation initiated and he subsequent went into PEA. Code resuscitation maintained for over 20 minutes, including intubation and proper algorithm for cardiac arrest in this setting. Despite efforts patient remained pulseless and at 7:25 am resuscitation efforts were terminated and patient declared dead.  Chemotherapy-induced nausea and vomiting -until 05/13/18 improved with IVF's and as needed antiemetics  Diarrhea- most likely chemo related -C. Diff neg and improved with use of PRN lomotil   Colon cancer ([place])- noted -followed by Dr. Benay Spice; who was actively involved in his care during this hospitalization.  -last chem,otherapy treatment 11/04/17  Chronic a-fib on coumadin -pharmacy was consulted for coumadin management -CHADsVasc score 4 -last INR 6.81 on 11/13/17  -vitamin k po 2.5 mg  given on the moment of admission and repeated again on 1/9 (but last one patient refused)  -no overt bleeding appreciated, Hgb stable and coumadin remained on hold.  Supratherapeutic INR -pharmacy was helping with coumadin management  -no sign of overt bleeding -INR last 6.81 -see above for details on intervention and care.   Essential hypertension -stable and well controlled -patient was kept on his antihypertensive regimen   Dehydration 2/2 to N,V and diarrhea -treated with IVF's and PRN antiemetics and PRN lomotil.  AKI (acute kidney injury) on CKD 3 -baseline cr 1.3; was 2.08 on admission  -nephrotoxic agents minimized/avoided and IVF's given   Anemia of chronic disease -baseline hg 10 -he was actively followed by hem/onc as an outpatient  -transfuse for Hgb less than 7 -no signs of acute bleeding appreciated  Hypomagnesemia and hypokalemia -Stable and within normal limits after repletion. -associated to GI loses    Time: 25 minutes  Signed:  Barton Dubois  MD [facility] 11/28/2017, 8:46 AM

## 2017-12-06 NOTE — Progress Notes (Signed)
Approximately  0700 RN Neil Crouch found patient to be agonally breathing and unresponsive. Unit team notified. 0702 CPR/Code initiated 0725 Patient remains pulseless, code ended per Dr. Jeneen Rinks and Dr. Dyann Kief

## 2017-12-06 NOTE — ED Provider Notes (Addendum)
CC: Unresponsive  I responded to the overhead page of [place]. Arrived to find the patient in bed undergoing bag assisted ventilations and CPR.   Per bedside nurses he was found with "agonal respirations" and a blue initiated at 702.  Patient with cecal cancer undergoing chemotherapy admitted for weakness and dehydration. Supra therapeutic anticoagulation with INR above 10 yesterday.  CPR was in progress. With pulse check there was no spontaneous circulation. Complex bradycardic rhythm/PDA and the monitor. CPR was continued. Patient given epinephrine IV.  Patient went through ACLS protocols. Had multiple rounds of epinephrine. With wide complex rhythm was given a serum chloride 1, bicarbonate 2 with concern for the possibility of hyperkalemia. Last potassium was 3.2 and was 2 days ago. No a.m. labs today.  She was intubated by anesthesia. Patient without epinephrine every 3-5 minutes and pulse checks with epinephrine. Head V. fib 2 and underwent defibrillation at 200 J 2. First deteriorated to why, place pulse is rhythm. The second period of asystole. CPR continued after each pulse check in after each attempt at defibrillation. Clinically no signs of JVD or tracheal deviation is suggest tension pneumothorax. His given bicarbonate to treat acidosis. No JVD to suggest cardial tympanotomy. Was given bicarbonate calcium to treat possible hyperkalemia. Per nursing this patient was found with vomitus in his mouth. This likely started as a respiratory arrest. We have treated/addressed all potential reversible causes of pulseless O activity and asystole. After 23 minutes he has had no return of spontaneous circulation despite full resuscitative efforts. Resuscitative effort terminated by myself at 7:25 AM.   I discussed the case further with Dr. Dyann Kief. I further spoke with nursing staff. Family is not present.   Nursing staff has notified family. Dr. Dyann Kief the patient's attending physician was  present for the resuscitation as well.   CRITICAL CARE Performed by: Lolita Patella   Total critical care time: 30 minutes  Critical care time was exclusive of separately billable procedures and treating other patients.  Critical care was necessary to treat or prevent imminent or life-threatening deterioration.  Critical care was time spent personally by me on the following activities: development of treatment plan with patient and/or surrogate as well as nursing, discussions with consultants, evaluation of patient's response to treatment, examination of patient, obtaining history from patient or surrogate, ordering and performing treatments and interventions, ordering and review of laboratory studies, ordering and review of radiographic studies, pulse oximetry and re-evaluation of patient's condition.    Tanna Furry, MD Nov 22, 2017 [id number]    Tanna Furry, MD Nov 22, 2017 [phone]

## 2017-12-06 NOTE — Consult Note (Signed)
   [facility] CM Inpatient Consult   Dec 09, 2017  RIORDAN WALLE August 23, 1946 [id number]   Mr. Peddy screened for potential [facility] Care Management services.   Spoke with inpatient RNCM . Made aware that Mr. Kisner unfortunately expired today.    Marthenia Rolling, MSN-Ed, RN,BSN [facility] Liaison [phone]

## 2017-12-06 NOTE — Anesthesia Procedure Notes (Signed)
Procedure Name: Intubation Date/Time: Nov 27, 2017 7:15 AM Performed by: Lollie Sails, CRNA Pre-anesthesia Checklist: Suction available and Emergency Drugs available Oxygen Delivery Method: Ambu bag Preoxygenation: Pre-oxygenation with 100% oxygen Laryngoscope Size: Mac and 4 Grade View: Grade I Tube type: Subglottic suction tube Tube size: 7.5 mm Number of attempts: 2 Airway Equipment and Method: Stylet Placement Confirmation: ETT inserted through vocal cords under direct vision and CO2 detector Secured at: 23 cm Tube secured with: Tube holder by Respiratory. Dental Injury: Teeth and Oropharynx as per pre-operative assessment  Comments: Called to code.   Patient in cardiac arrest with CPR in progress.   Intubation by P. [place].   Large amount of emesis in back of throat.   First attempt - unsuccessful.    Second attempt successful with full view of cords and + ETCO2.    Respiratory at bedside to secure endotube.

## 2017-12-06 DEATH — deceased

## 2018-02-15 IMAGING — DX DG CHEST 1V PORT
1 series · 1 of 1 positions shown · non-contrast
Comparison: 09/15/2017 chest radiograph.

CLINICAL DATA: 71 y/o  M; respiratory distress.

EXAM:
PORTABLE CHEST 1 VIEW

[chest ap]
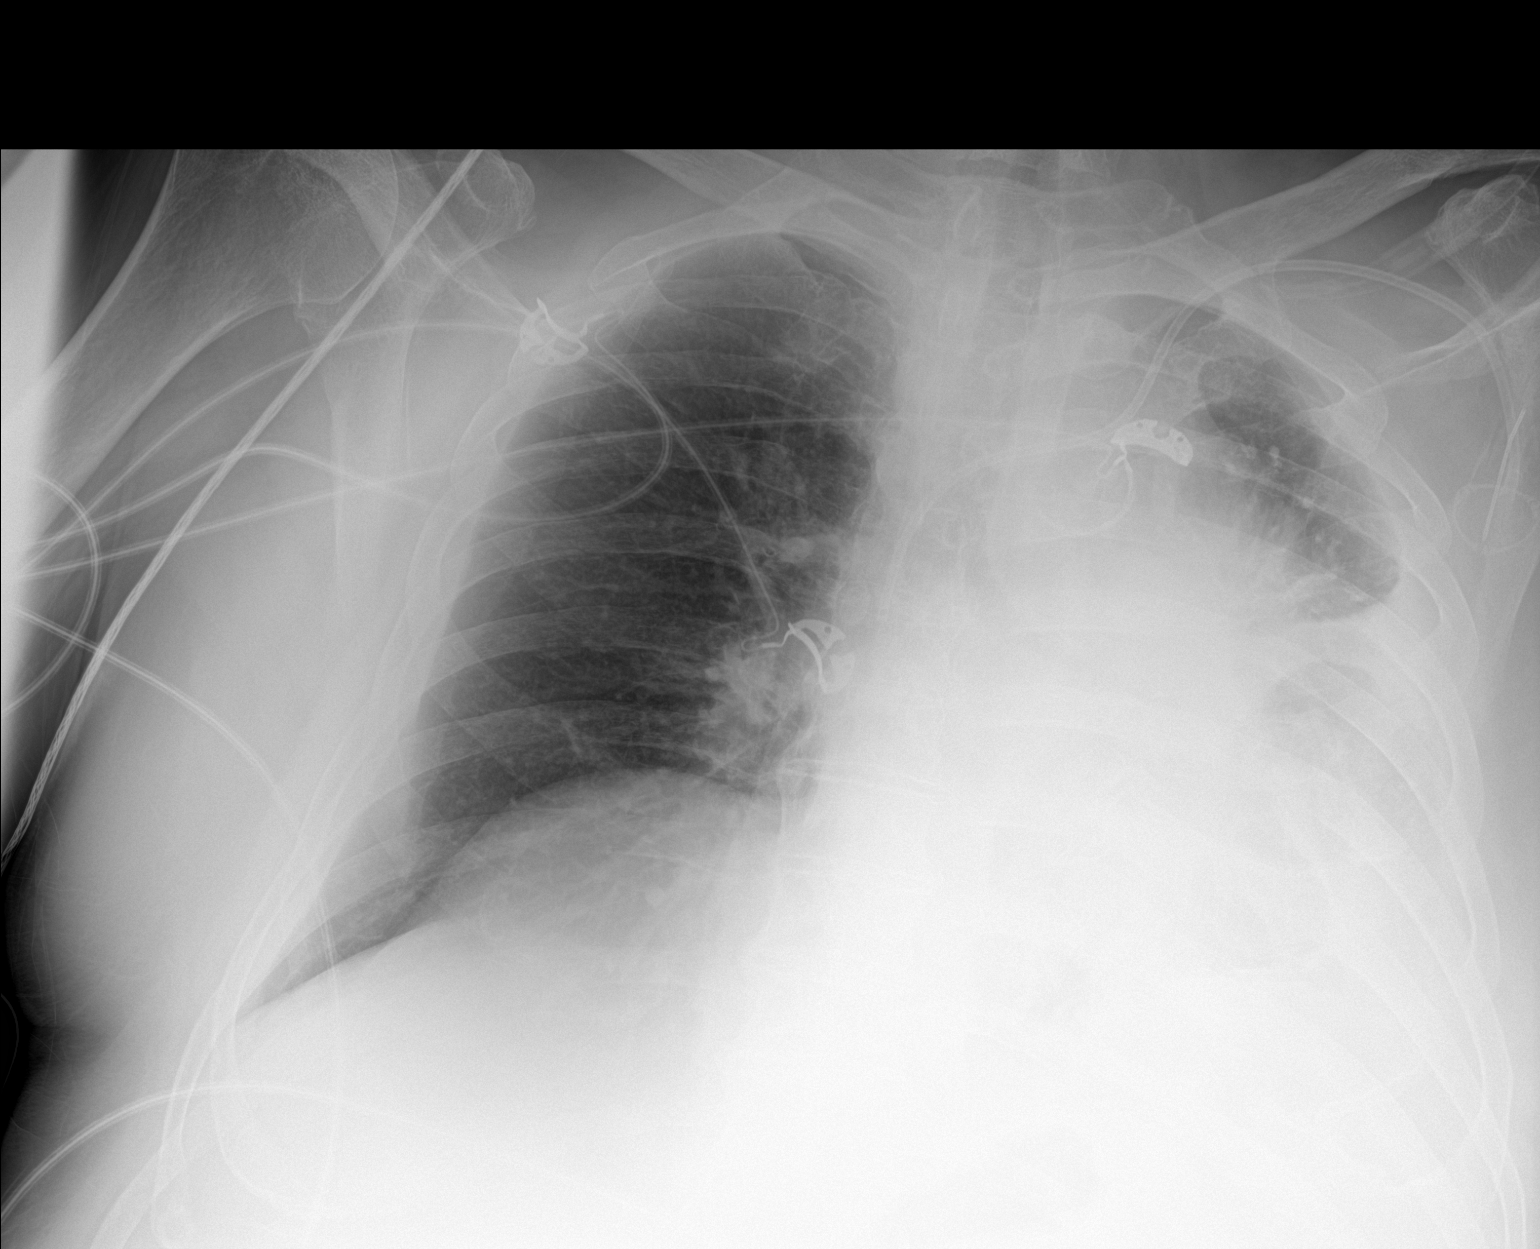

[1 of 1 positions shown; findings below may reference images not displayed]

FINDINGS: Large increased left pleural effusion and worsening atelectasis of
left lung. Clear right lung. Cardiac silhouette obscured by left
diffusion. Left central venous catheter tip projects over mid S be
seen. No acute osseous abnormality is evident.
IMPRESSION: Increased large left pleural effusion and worsening atelectasis of
left lung. Clear right lung.

By: Nomasibulele Mo[place]he M.D.

## 2018-02-19 IMAGING — CT CT CHEST W/O CM
2 of 3 series · 15 of 36 positions shown, 18 images · non-contrast
Comparison: CT chest 09/16/2017

CLINICAL DATA: Cough, persistent.

EXAM:
CT CHEST WITHOUT CONTRAST
TECHNIQUE: Multidetector CT imaging of the chest was performed following the
standard protocol without IV contrast.

[Series 2: thorax · axial · 0.82mm/px · z∈[-17,+223]mm · 12 of 142 slices shown, 15 images]
[im 11/142  mediastinal]
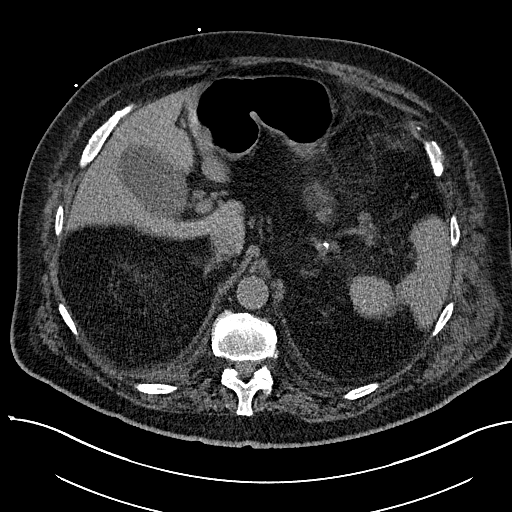
[im 11/142  lung]
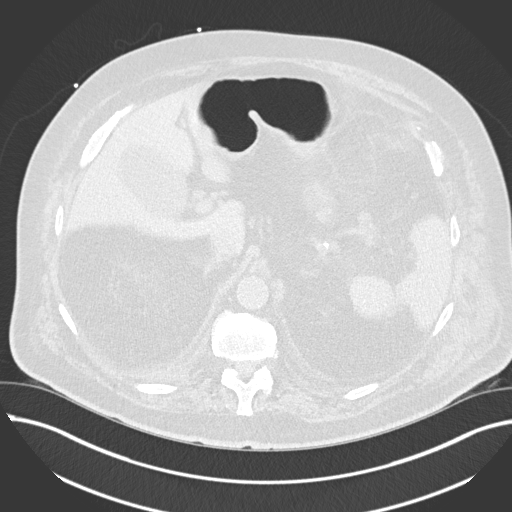
[im 21/142  lung]
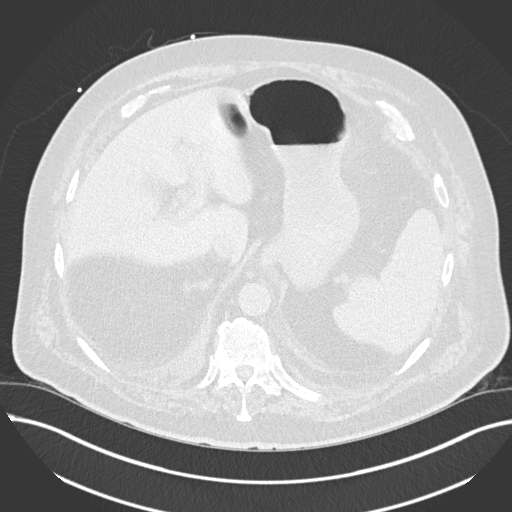
[im 32/142  lung]
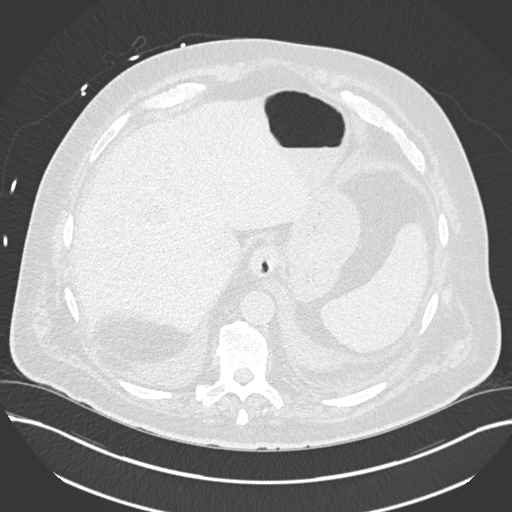
[im 42/142  lung]
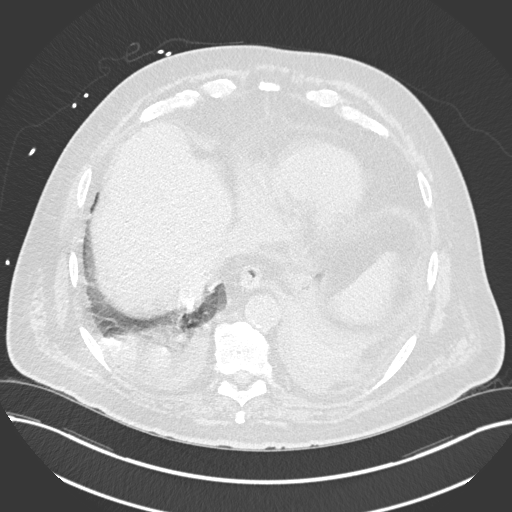
[im 53/142  mediastinal]
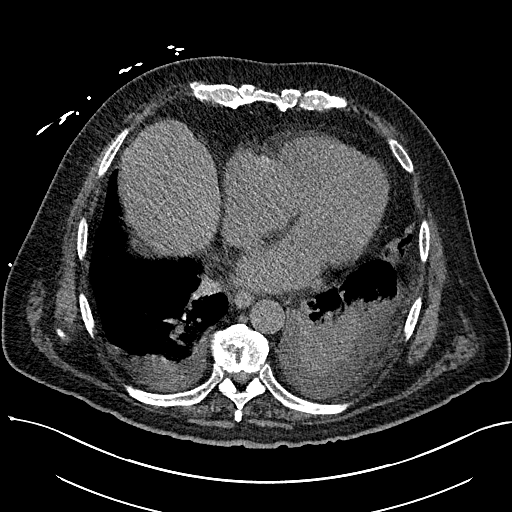
[im 53/142  lung]
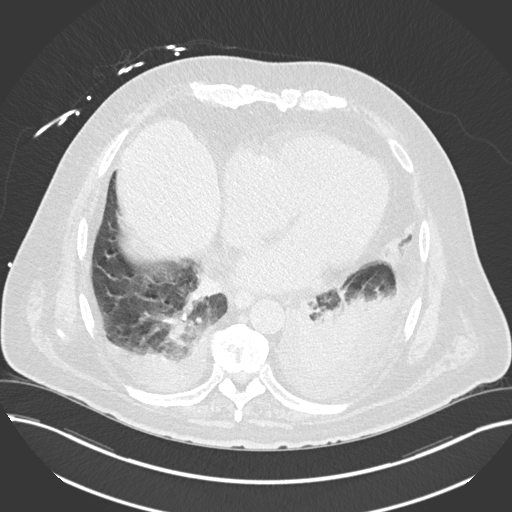
[im 63/142  lung]
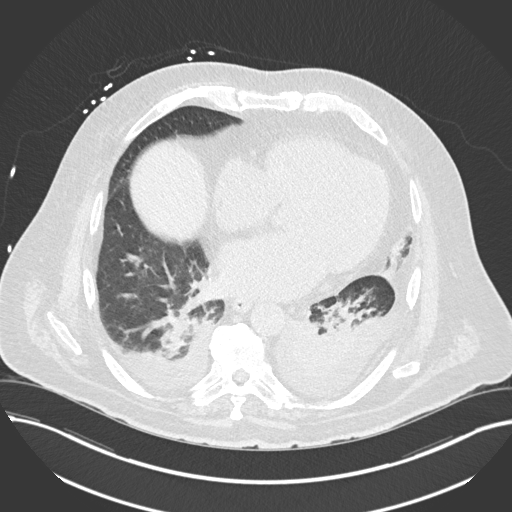
[im 79/142  lung]
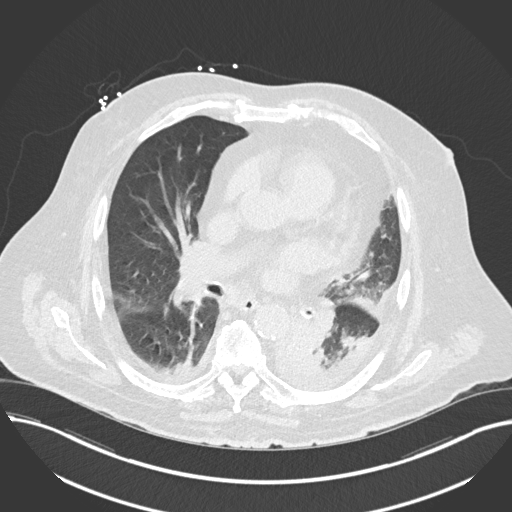
[im 89/142  lung]
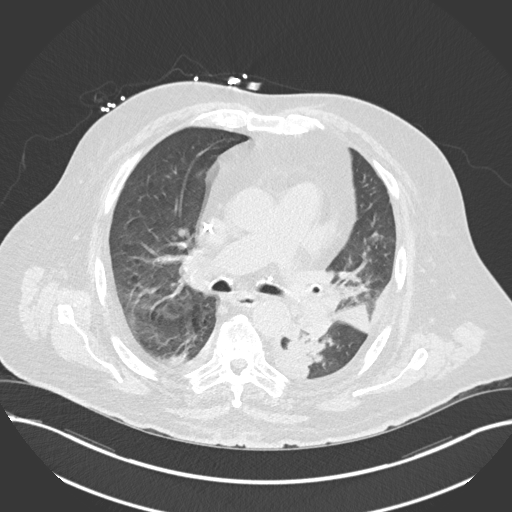
[im 100/142  mediastinal]
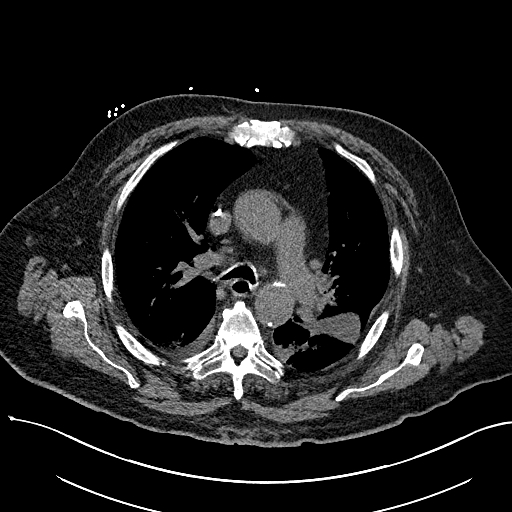
[im 100/142  lung]
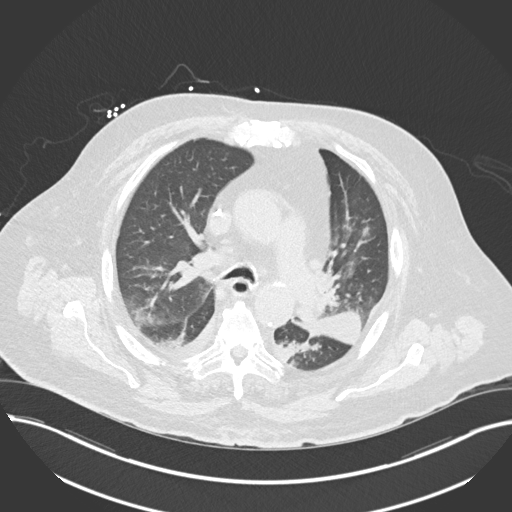
[im 110/142  lung]
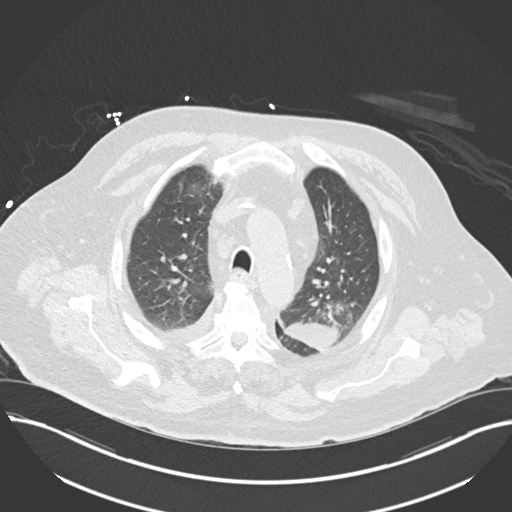
[im 121/142  lung]
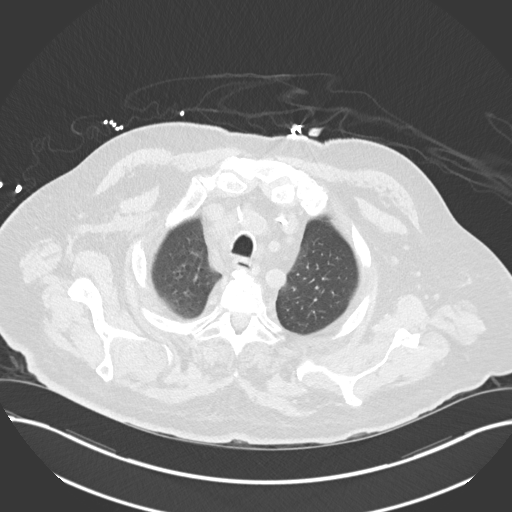
[im 131/142  lung]
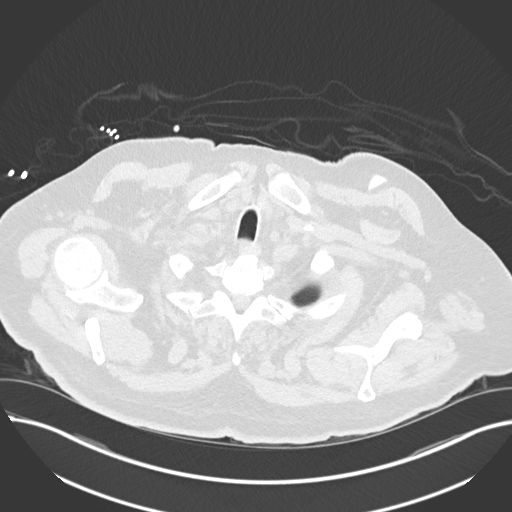

[Series 6: coronal · coronal · 0.57mm/px · 3 of 172 slices shown]
[im 35/172  lung]
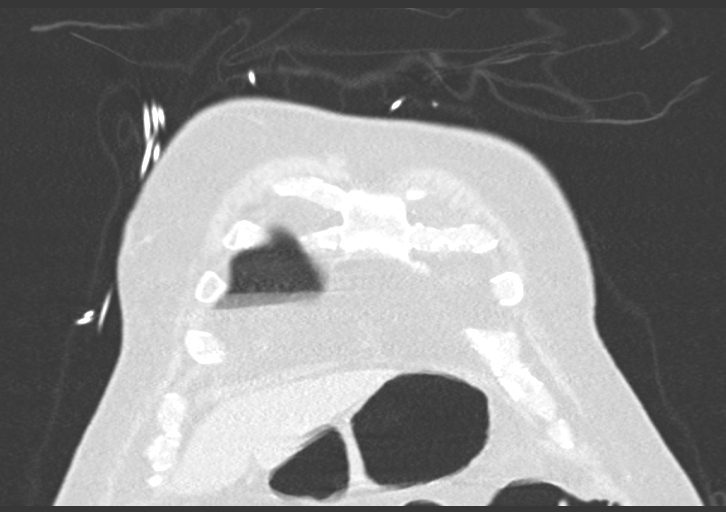
[im 69/172  lung]
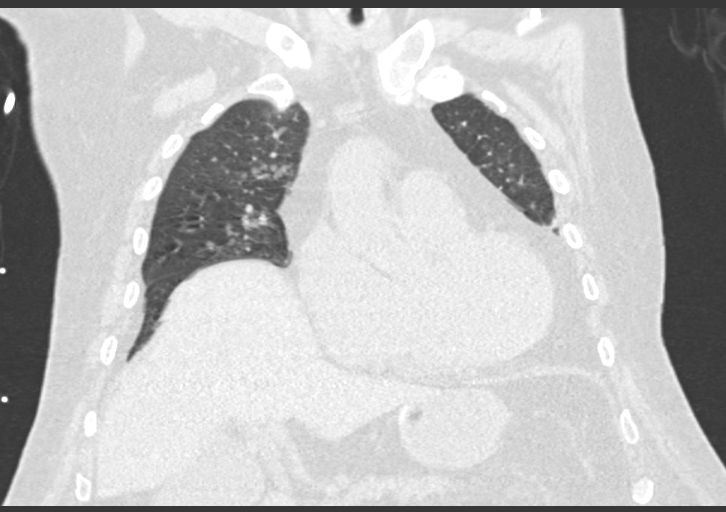
[im 103/172  lung]
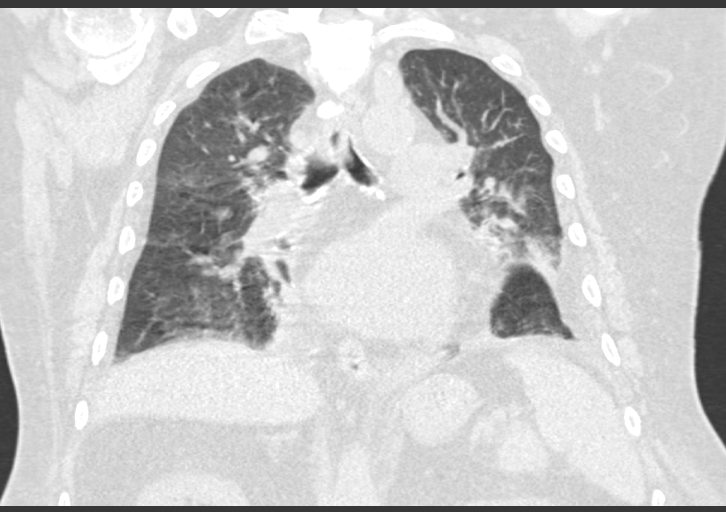

[15 of 36 positions shown; findings below may reference images not displayed]

FINDINGS: Cardiovascular: Heart is enlarged. Coronary artery calcifications
are again noted. Atherosclerotic calcifications are present along
the undersurface of the arch. There is no aneurysm. Pulmonary
arteries are normal in size.

Mediastinum/Nodes: No significant mediastinal or axillary adenopathy
is present. Secretions previously noted in the left mainstem
bronchus and proximal lobar arteries has resolved. The esophagus is
mildly dilated. A small hiatal hernia is present.

Lungs/Pleura: Left greater than right medial lower lobe airspace
opacities are again seen. There is fluid or soft tissue in the
posteromedial basilar segmental bronchi. Peribronchial thickening is
seen posteriorly in the lingula and in the right middle lobe.

Bilateral pleural effusions have increased, left greater than right.
Loculated fluid is again noted along the left major fissure.

Upper Abdomen: Layering density suggests small gallstones. There is
no inflammatory change of the gallbladder. The visualized liver is
within normal limits. Visualized solid organs are otherwise
unremarkable.

Musculoskeletal: Diffuse ankylosis of the thoracic spine is again
noted.
IMPRESSION: 1. Interval clearing of material within left mainstem bronchus.
2. Persistent left lower lobe pneumonia.
3. Mild right lower lobe atelectasis versus early lobar pneumonia.
4. Increased bilateral pleural effusions, left greater than right.
5. Loculated left pleural effusion along the major fissure.
6. Coronary artery disease.
7.  Aortic Atherosclerosis ([id number]).

## 2018-04-24 NOTE — Telephone Encounter (Signed)
Error opening
